# Patient Record
Sex: Female | Born: 1985
Health system: Southern US, Community
[De-identification: ages and names within clinical notes are randomized; demographics above are authoritative.]

## PROBLEM LIST (undated history)

## (undated) ENCOUNTER — Inpatient Hospital Stay (HOSPITAL_COMMUNITY): Payer: Self-pay

## (undated) ENCOUNTER — Emergency Department (HOSPITAL_COMMUNITY): Admission: EM | Disposition: A | Discharge status: Other Acute Inpatient Hospital | Payer: Medicaid Other

## (undated) DIAGNOSIS — F32A Depression, unspecified: Secondary | ICD-10-CM

## (undated) DIAGNOSIS — F410 Panic disorder [episodic paroxysmal anxiety] without agoraphobia: Secondary | ICD-10-CM

## (undated) DIAGNOSIS — R569 Unspecified convulsions: Secondary | ICD-10-CM

## (undated) DIAGNOSIS — Z9289 Personal history of other medical treatment: Secondary | ICD-10-CM

## (undated) DIAGNOSIS — E1165 Type 2 diabetes mellitus with hyperglycemia: Secondary | ICD-10-CM

## (undated) DIAGNOSIS — G43909 Migraine, unspecified, not intractable, without status migrainosus: Secondary | ICD-10-CM

## (undated) DIAGNOSIS — F329 Major depressive disorder, single episode, unspecified: Secondary | ICD-10-CM

## (undated) DIAGNOSIS — M419 Scoliosis, unspecified: Secondary | ICD-10-CM

## (undated) DIAGNOSIS — O09299 Supervision of pregnancy with other poor reproductive or obstetric history, unspecified trimester: Secondary | ICD-10-CM

## (undated) HISTORY — PX: SKIN GRAFT: SHX250

## (undated) HISTORY — PX: FOOT SURGERY: SHX648

## (undated) HISTORY — PX: DILATION AND CURETTAGE OF UTERUS: SHX78

## (undated) HISTORY — DX: Personal history of other medical treatment: Z92.89

## (undated) HISTORY — DX: Supervision of pregnancy with other poor reproductive or obstetric history, unspecified trimester: O09.299

## (undated) HISTORY — DX: Unspecified convulsions: R56.9

---

## 2006-07-11 HISTORY — PX: WISDOM TOOTH EXTRACTION: SHX21

## 2010-02-11 ENCOUNTER — Emergency Department (HOSPITAL_COMMUNITY): Admission: EM | Admit: 2010-02-11 | Discharge: 2010-02-11 | Payer: Self-pay | Admitting: Emergency Medicine

## 2010-09-09 ENCOUNTER — Emergency Department (HOSPITAL_COMMUNITY)
Admission: EM | Admit: 2010-09-09 | Discharge: 2010-09-09 | Discharge status: Against Medical Advice | Payer: Medicaid Other | Attending: Emergency Medicine | Admitting: Emergency Medicine

## 2010-09-09 DIAGNOSIS — H9209 Otalgia, unspecified ear: Secondary | ICD-10-CM | POA: Insufficient documentation

## 2010-09-20 ENCOUNTER — Emergency Department (HOSPITAL_COMMUNITY): Payer: Medicaid Other

## 2010-09-20 ENCOUNTER — Emergency Department (HOSPITAL_COMMUNITY)
Admission: EM | Admit: 2010-09-20 | Discharge: 2010-09-20 | Disposition: A | Discharge status: Home / Self Care | Payer: Medicaid Other | Attending: Emergency Medicine | Admitting: Emergency Medicine

## 2010-09-20 DIAGNOSIS — R209 Unspecified disturbances of skin sensation: Secondary | ICD-10-CM | POA: Insufficient documentation

## 2010-09-20 DIAGNOSIS — R110 Nausea: Secondary | ICD-10-CM | POA: Insufficient documentation

## 2010-09-20 DIAGNOSIS — R071 Chest pain on breathing: Secondary | ICD-10-CM | POA: Insufficient documentation

## 2010-09-20 LAB — PREGNANCY, URINE: Preg Test, Ur: NEGATIVE

## 2010-09-20 LAB — POCT I-STAT, CHEM 8
BUN: 5 mg/dL — ABNORMAL LOW (ref 6–23)
Chloride: 105 mEq/L (ref 96–112)
Creatinine, Ser: 1.1 mg/dL (ref 0.4–1.2)
Potassium: 4.2 mEq/L (ref 3.5–5.1)
Sodium: 142 mEq/L (ref 135–145)
TCO2: 25 mmol/L (ref 0–100)

## 2010-09-20 LAB — POCT CARDIAC MARKERS
Myoglobin, poc: 41.9 ng/mL (ref 12–200)
Troponin i, poc: <0.05 ng/mL (ref 0.00–0.09)

## 2010-09-20 LAB — URINALYSIS, ROUTINE W REFLEX MICROSCOPIC
Bilirubin Urine: NEGATIVE
Ketones, ur: NEGATIVE mg/dL
Nitrite: NEGATIVE
Specific Gravity, Urine: 1.012 (ref 1.005–1.030)
Urobilinogen, UA: 1 mg/dL (ref 0.0–1.0)

## 2010-09-20 LAB — CBC
HCT: 42.2 % (ref 36.0–46.0)
Hemoglobin: 14 g/dL (ref 12.0–15.0)
MCH: 30.2 pg (ref 26.0–34.0)
RBC: 4.64 MIL/uL (ref 3.87–5.11)

## 2010-09-20 LAB — DIFFERENTIAL
Basophils Relative: 0 % (ref 0–1)
Lymphocytes Relative: 38 % (ref 12–46)
Monocytes Absolute: 0.4 10*3/uL (ref 0.1–1.0)
Monocytes Relative: 8 % (ref 3–12)
Neutro Abs: 2.3 10*3/uL (ref 1.7–7.7)
Neutrophils Relative %: 50 % (ref 43–77)

## 2011-01-31 ENCOUNTER — Emergency Department (HOSPITAL_COMMUNITY)
Admission: EM | Admit: 2011-01-31 | Discharge: 2011-01-31 | Discharge status: Against Medical Advice | Payer: Self-pay | Attending: Emergency Medicine | Admitting: Emergency Medicine

## 2011-01-31 DIAGNOSIS — R209 Unspecified disturbances of skin sensation: Secondary | ICD-10-CM | POA: Insufficient documentation

## 2011-08-30 ENCOUNTER — Encounter (HOSPITAL_COMMUNITY): Payer: Self-pay | Admitting: *Deleted

## 2011-08-30 ENCOUNTER — Inpatient Hospital Stay (HOSPITAL_COMMUNITY): Payer: Medicaid Other

## 2011-08-30 ENCOUNTER — Inpatient Hospital Stay (HOSPITAL_COMMUNITY)
Admission: AD | Admit: 2011-08-30 | Discharge: 2011-08-30 | Disposition: A | Discharge status: Home / Self Care | Payer: Medicaid Other | Source: Ambulatory Visit | Attending: Obstetrics & Gynecology | Admitting: Obstetrics & Gynecology

## 2011-08-30 DIAGNOSIS — B9689 Other specified bacterial agents as the cause of diseases classified elsewhere: Secondary | ICD-10-CM | POA: Insufficient documentation

## 2011-08-30 DIAGNOSIS — A499 Bacterial infection, unspecified: Secondary | ICD-10-CM

## 2011-08-30 DIAGNOSIS — N949 Unspecified condition associated with female genital organs and menstrual cycle: Secondary | ICD-10-CM | POA: Insufficient documentation

## 2011-08-30 DIAGNOSIS — O99891 Other specified diseases and conditions complicating pregnancy: Secondary | ICD-10-CM | POA: Insufficient documentation

## 2011-08-30 DIAGNOSIS — O2390 Unspecified genitourinary tract infection in pregnancy, unspecified trimester: Secondary | ICD-10-CM | POA: Insufficient documentation

## 2011-08-30 DIAGNOSIS — N760 Acute vaginitis: Secondary | ICD-10-CM

## 2011-08-30 DIAGNOSIS — R109 Unspecified abdominal pain: Secondary | ICD-10-CM | POA: Insufficient documentation

## 2011-08-30 HISTORY — DX: Major depressive disorder, single episode, unspecified: F32.9

## 2011-08-30 HISTORY — DX: Panic disorder (episodic paroxysmal anxiety): F41.0

## 2011-08-30 HISTORY — DX: Depression, unspecified: F32.A

## 2011-08-30 LAB — URINALYSIS, ROUTINE W REFLEX MICROSCOPIC
Bilirubin Urine: NEGATIVE
Glucose, UA: NEGATIVE mg/dL
Hgb urine dipstick: NEGATIVE
Specific Gravity, Urine: 1.02 (ref 1.005–1.030)

## 2011-08-30 LAB — DIFFERENTIAL
Basophils Relative: 0 % (ref 0–1)
Eosinophils Absolute: 0.1 10*3/uL (ref 0.0–0.7)
Eosinophils Relative: 1 % (ref 0–5)
Lymphs Abs: 2.5 10*3/uL (ref 0.7–4.0)

## 2011-08-30 LAB — CBC
MCH: 30.9 pg (ref 26.0–34.0)
MCHC: 32.8 g/dL (ref 30.0–36.0)
MCV: 94.2 fL (ref 78.0–100.0)
Platelets: 231 10*3/uL (ref 150–400)
RDW: 13.1 % (ref 11.5–15.5)

## 2011-08-30 LAB — WET PREP, GENITAL
Trich, Wet Prep: NONE SEEN
Yeast Wet Prep HPF POC: NONE SEEN

## 2011-08-30 LAB — HCG, QUANTITATIVE, PREGNANCY: hCG, Beta Chain, Quant, S: 929 m[IU]/mL — ABNORMAL HIGH (ref <5)

## 2011-08-30 LAB — POCT PREGNANCY, URINE: Preg Test, Ur: POSITIVE — AB

## 2011-08-30 MED ORDER — METRONIDAZOLE 500 MG PO TABS: 500.0000 mg | ORAL_TABLET | Freq: Two times a day (BID) | ORAL | Status: AC

## 2011-08-30 NOTE — ED Provider Notes (Signed)
History     Chief Complaint  Patient presents with  . Abdominal Pain  . Possible Pregnancy   HPI 26 y.o. [id number] c/o low abd cramping, no bleeding. + UPT, took Plan B in February, wants "to make sure everything is ok".   Past Medical History  Diagnosis Date  . Asthma   . Panic attacks   . Depression     No meds. currently  . Constipation     Past Surgical History  Procedure Date  . Foot surgery     R foot, repair torn tissue  . Dilation and curettage of uterus     Suction D&C to remove 16 wk. fetal demise  . Wisdom tooth extraction     Family History  Problem Relation Age of Onset  . Anesthesia problems Neg Hx     History  Substance Use Topics  . Smoking status: Never Smoker   . Smokeless tobacco: Never Used  . Alcohol Use: Yes     Every other weekend    Allergies: No Known Allergies  Prescriptions prior to admission  Medication Sig Dispense Refill  . acetaminophen (TYLENOL) 500 MG tablet Take 500 mg by mouth every 6 (six) hours as needed. Head ache      . Pseudoeph-Doxylamine-DM-APAP (NYQUIL D COLD/FLU) 60-12.12-07-998 MG/30ML LIQD Take by mouth.        Review of Systems  Constitutional: Negative.   Respiratory: Negative.   Cardiovascular: Negative.   Gastrointestinal: Positive for abdominal pain. Negative for nausea, vomiting, diarrhea and constipation.  Genitourinary: Negative for dysuria, urgency, frequency, hematuria and flank pain.       Negative for vaginal bleeding, vaginal discharge  Musculoskeletal: Negative.   Neurological: Negative.   Psychiatric/Behavioral: Negative.    Physical Exam   Blood pressure 112/67, pulse 81, temperature 97.8 F (36.6 C), temperature source Oral, resp. rate 16, height 5\' 7"  (1.702 m), weight 117 lb 9.6 oz (53.343 kg), last menstrual period 08/01/2011, SpO2 100.00%.  Physical Exam  Nursing note and vitals reviewed. Constitutional: She is oriented to person, place, and time. She appears well-developed and  well-nourished. No distress.  HENT:  Head: Normocephalic and atraumatic.  Cardiovascular: Normal rate, regular rhythm and normal heart sounds.   Respiratory: Effort normal and breath sounds normal. No respiratory distress.  GI: Soft. Bowel sounds are normal. She exhibits no distension and no mass. There is no tenderness. There is no rebound and no guarding.  Genitourinary: There is no rash or lesion on the right labia. There is no rash or lesion on the left labia. Uterus is not deviated, not enlarged, not fixed and not tender. Cervix exhibits no motion tenderness, no discharge and no friability. Right adnexum displays no mass, no tenderness and no fullness. Left adnexum displays no mass, no tenderness and no fullness. No erythema, tenderness or bleeding around the vagina. No vaginal discharge found.  Neurological: She is alert and oriented to person, place, and time.  Skin: Skin is warm and dry.  Psychiatric: She has a normal mood and affect.    MAU Course  Procedures  Results for orders placed during the hospital encounter of 08/30/11 (from the past 72 hour(s))  URINALYSIS, ROUTINE W REFLEX MICROSCOPIC     Status: Abnormal   Collection Time   08/30/11  5:04 PM      Component Value Range Comment   Color, Urine YELLOW  YELLOW     APPearance HAZY (*) CLEAR     Specific Gravity, Urine 1.020  1.005 -  1.030     pH 8.0  5.0 - 8.0     Glucose, UA NEGATIVE  NEGATIVE (mg/dL)    Hgb urine dipstick NEGATIVE  NEGATIVE     Bilirubin Urine NEGATIVE  NEGATIVE     Ketones, ur NEGATIVE  NEGATIVE (mg/dL)    Protein, ur NEGATIVE  NEGATIVE (mg/dL)    Urobilinogen, UA 0.2  0.0 - 1.0 (mg/dL)    Nitrite NEGATIVE  NEGATIVE     Leukocytes, UA NEGATIVE  NEGATIVE  MICROSCOPIC NOT DONE ON URINES WITH NEGATIVE PROTEIN, BLOOD, LEUKOCYTES, NITRITE, OR GLUCOSE <1000 mg/dL.  POCT PREGNANCY, URINE     Status: Abnormal   Collection Time   08/30/11  5:27 PM      Component Value Range Comment   Preg Test, Ur POSITIVE  (*) NEGATIVE    CBC     Status: Normal   Collection Time   08/30/11  7:00 PM      Component Value Range Comment   WBC 7.4  4.0 - 10.5 (K/uL)    RBC 4.30  3.87 - 5.11 (MIL/uL)    Hemoglobin 13.3  12.0 - 15.0 (g/dL)    HCT 16.1  09.6 - 04.5 (%)    MCV 94.2  78.0 - 100.0 (fL)    MCH 30.9  26.0 - 34.0 (pg)    MCHC 32.8  30.0 - 36.0 (g/dL)    RDW 40.9  81.1 - 91.4 (%)    Platelets 231  150 - 400 (K/uL)   DIFFERENTIAL     Status: Normal   Collection Time   08/30/11  7:00 PM      Component Value Range Comment   Neutrophils Relative 59  43 - 77 (%)    Neutro Abs 4.4  1.7 - 7.7 (K/uL)    Lymphocytes Relative 33  12 - 46 (%)    Lymphs Abs 2.5  0.7 - 4.0 (K/uL)    Monocytes Relative 7  3 - 12 (%)    Monocytes Absolute 0.5  0.1 - 1.0 (K/uL)    Eosinophils Relative 1  0 - 5 (%)    Eosinophils Absolute 0.1  0.0 - 0.7 (K/uL)    Basophils Relative 0  0 - 1 (%)    Basophils Absolute 0.0  0.0 - 0.1 (K/uL)   HCG, QUANTITATIVE, PREGNANCY     Status: Abnormal   Collection Time   08/30/11  7:00 PM      Component Value Range Comment   hCG, Beta Chain, Quant, S 929 (*) <5 (mIU/mL)   ABO/RH     Status: Normal   Collection Time   08/30/11  7:00 PM      Component Value Range Comment   ABO/RH(D) AB POS     GC/CHLAMYDIA PROBE AMP, GENITAL     Status: Abnormal   Collection Time   08/30/11  8:32 PM      Component Value Range Comment   GC Probe Amp, Genital NEGATIVE  NEGATIVE     Chlamydia, DNA Probe POSITIVE (*) NEGATIVE    WET PREP, GENITAL     Status: Abnormal   Collection Time   08/30/11  8:32 PM      Component Value Range Comment   Yeast Wet Prep HPF POC NONE SEEN  NONE SEEN     Trich, Wet Prep NONE SEEN  NONE SEEN     Clue Cells Wet Prep HPF POC MODERATE (*) NONE SEEN     WBC, Wet Prep HPF POC RARE (*)  NONE SEEN  MODERATE BACTERIA SEEN   US Ob Comp Less 14 Wks  08/30/2011  *RADIOLOGY REPORT*  Clinical Data: Pelvic cramping.  4 weeks of 1 day pregnant by last menstrual period.  Quantitative beta  HCG 929.  OBSTETRIC <14 WK [place] AND TRANSVAGINAL OB US  Technique:  Both transabdominal and transvaginal ultrasound examinations were performed for complete evaluation of the gestation as well as the maternal uterus, adnexal regions, and pelvic cul-de-sac.  Transvaginal technique was performed to assess early pregnancy.  Comparison:  None.  Findings:  Small, elliptical collection of fluid within the endometrium in the uterine fundus.  This does not have the typical appearance of a gestational sac.  Normal appearing ovaries.  No adnexal masses or fluid collections.  No free peritoneal fluid.  IMPRESSION: Small amount of fluid in the endometrial cavity in the uterine fundus.  No gestational sac or ectopic pregnancy visible at this time.  Follow-up beta HCG and ultrasound, as clinically indicated, are recommended.  Original Report Authenticated By: Darrol Angel, M.D.   US Ob Transvaginal  08/30/2011  *RADIOLOGY REPORT*  Clinical Data: Pelvic cramping.  4 weeks of 1 day pregnant by last menstrual period.  Quantitative beta HCG 929.  OBSTETRIC <14 WK [place] AND TRANSVAGINAL OB US  Technique:  Both transabdominal and transvaginal ultrasound examinations were performed for complete evaluation of the gestation as well as the maternal uterus, adnexal regions, and pelvic cul-de-sac.  Transvaginal technique was performed to assess early pregnancy.  Comparison:  None.  Findings:  Small, elliptical collection of fluid within the endometrium in the uterine fundus.  This does not have the typical appearance of a gestational sac.  Normal appearing ovaries.  No adnexal masses or fluid collections.  No free peritoneal fluid.  IMPRESSION: Small amount of fluid in the endometrial cavity in the uterine fundus.  No gestational sac or ectopic pregnancy visible at this time.  Follow-up beta HCG and ultrasound, as clinically indicated, are recommended.  Original Report Authenticated By: Darrol Angel, M.D.    Assessment and Plan  26  y.o. [id number] at Unknown EGA with cramping BV - rx flagyl F/U for repeat quant in 48 hours  Alister Staver 08/30/2011, 8:39 PM

## 2011-08-30 NOTE — Progress Notes (Signed)
Patient states she took Plan B in early February. Had been having cramping and took a home pregnancy test that was positive. Has been having lower abdominal cramping.

## 2011-08-31 LAB — GC/CHLAMYDIA PROBE AMP, GENITAL
Chlamydia, DNA Probe: POSITIVE — AB
GC Probe Amp, Genital: NEGATIVE

## 2011-09-02 ENCOUNTER — Inpatient Hospital Stay (HOSPITAL_COMMUNITY)
Admission: AD | Admit: 2011-09-02 | Discharge: 2011-09-02 | Disposition: A | Discharge status: Home / Self Care | Payer: Medicaid Other | Source: Ambulatory Visit | Attending: Obstetrics & Gynecology | Admitting: Obstetrics & Gynecology

## 2011-09-02 ENCOUNTER — Encounter (HOSPITAL_COMMUNITY): Payer: Self-pay

## 2011-09-02 ENCOUNTER — Encounter (HOSPITAL_COMMUNITY): Payer: Self-pay | Admitting: Advanced Practice Midwife

## 2011-09-02 DIAGNOSIS — O209 Hemorrhage in early pregnancy, unspecified: Secondary | ICD-10-CM | POA: Insufficient documentation

## 2011-09-02 DIAGNOSIS — Z09 Encounter for follow-up examination after completed treatment for conditions other than malignant neoplasm: Secondary | ICD-10-CM

## 2011-09-02 NOTE — Discharge Instructions (Signed)
YOUR PREGNANCY HORMONE LEVEL HAS INCREASED FROM 929 TO 2985. THIS IS A NORMAL RISE. WE WILL HAVE YOU RETURN IN ONE WEEK TO REPEAT THE ULTRASOUND TO BE SURE THE PREGNANCY IS VISIBLE. RETURN SOONER FOR ANY PROBLEMS.

## 2011-09-02 NOTE — ED Provider Notes (Signed)
Nicole Gallegos is a 26 y.o. female @ [redacted]w[redacted]d gestation who presents to MAU for follow up Bhcg. On her visit 2/19 the bhcg was 929 and ultrasound showed no IUP or adnexal mass. Today the number has increased to 2985. We will schedule her follow up ultrasound for one week. She will return sooner for any problems.  [place], [place] 09/02/11 1332

## 2011-09-02 NOTE — Progress Notes (Signed)
Patient to MAU for repeat BHCG. Patient denies any pain or bleeding. Does have some nausea but no vomiting.

## 2011-09-09 ENCOUNTER — Inpatient Hospital Stay (HOSPITAL_COMMUNITY)
Admission: AD | Admit: 2011-09-09 | Discharge: 2011-09-09 | Disposition: A | Discharge status: Home / Self Care | Payer: Medicaid Other | Source: Ambulatory Visit | Attending: Family Medicine | Admitting: Family Medicine

## 2011-09-09 ENCOUNTER — Inpatient Hospital Stay (HOSPITAL_COMMUNITY): Payer: Medicaid Other

## 2011-09-09 DIAGNOSIS — O26899 Other specified pregnancy related conditions, unspecified trimester: Secondary | ICD-10-CM

## 2011-09-09 DIAGNOSIS — R109 Unspecified abdominal pain: Secondary | ICD-10-CM

## 2011-09-09 DIAGNOSIS — O210 Mild hyperemesis gravidarum: Secondary | ICD-10-CM | POA: Insufficient documentation

## 2011-09-09 MED ORDER — PROMETHAZINE HCL 25 MG PO TABS: 25.0000 mg | ORAL_TABLET | Freq: Four times a day (QID) | ORAL | Status: DC | PRN

## 2011-09-09 MED ORDER — ONDANSETRON 8 MG PO TBDP: 8.0000 mg | ORAL_TABLET | Freq: Three times a day (TID) | ORAL | Status: AC | PRN

## 2011-09-09 MED ORDER — ONDANSETRON 8 MG PO TBDP: 8.0000 mg | ORAL_TABLET | Freq: Three times a day (TID) | ORAL | Status: DC | PRN

## 2011-09-09 NOTE — Discharge Instructions (Signed)
Abdominal Pain During Pregnancy Abdominal discomfort is common in pregnancy. Most of the time, it does not cause harm. There are many causes of abdominal pain. Some causes are more serious than others. Some of the causes of abdominal pain in pregnancy are easily diagnosed. Occasionally, the diagnosis takes time to understand. Other times, the cause is not determined. Abdominal pain can be a sign that something is very wrong with the pregnancy, or the pain may have nothing to do with the pregnancy at all. For this reason, always tell your caregiver if you have any abdominal discomfort. CAUSES Common and harmless causes of abdominal pain include:  Constipation.   Excess gas and bloating.   Round ligament pain. This is pain that is felt in the folds of the groin.   The position the baby or placenta is in.   Baby kicks.   Braxton-Hicks contractions. These are mild contractions that do not cause cervical dilation.  Serious causes of abdominal pain include:  Ectopic pregnancy. This happens when a fertilized egg implants outside of the uterus.   Miscarriage.   Preterm labor. This is when labor starts at less than 37 weeks of pregnancy.   Placental abruption. This is when the placenta partially or completely separates from the uterus.   Preeclampsia. This is often associated with high blood pressure and has been referred to as "toxemia in pregnancy."   Uterine or amniotic fluid infections.  Causes unrelated to pregnancy include:  Urinary tract infection.   Gallbladder stones or inflammation.   Hepatitis or other liver illness.   Intestinal problems, stomach flu, food poisoning, or ulcer.   Appendicitis.   Kidney (renal) stones.   Kidney infection (pylonephritis).  HOME CARE INSTRUCTIONS  For mild pain:  Do not have sexual intercourse or put anything in your vagina until your symptoms go away completely.   Get plenty of rest until your pain improves. If your pain does not  improve in 1 hour, call your caregiver.   Drink clear fluids if you feel nauseous. Avoid solid food as long as you are uncomfortable or nauseous.   Only take medicine as directed by your caregiver.   Keep all follow-up appointments with your caregiver.  SEEK IMMEDIATE MEDICAL CARE IF:  You are bleeding, leaking fluid, or passing tissue from the vagina.   You have increasing pain or cramping.   You have persistent vomiting.   You have painful or bloody urination.   You have a fever.   You notice a decrease in your baby's movements.   You have extreme weakness or feel faint.   You have shortness of breath, with or without abdominal pain.   You develop a severe headache with abdominal pain.   You have abnormal vaginal discharge with abdominal pain.   You have persistent diarrhea.   You have abdominal pain that continues even after rest, or gets worse.  MAKE SURE YOU:   Understand these instructions.   Will watch your condition.   Will get help right away if you are not doing well or get worse.  Document Released: 06/27/2005 Document Revised: 03/09/2011 Document Reviewed: 01/21/2011 [employer] Patient Information 2012 [employer]. 

## 2011-09-09 NOTE — ED Provider Notes (Signed)
Chart reviewed and agree with management and plan.  

## 2011-09-09 NOTE — Progress Notes (Signed)
Here for follow up US, ordered by Np

## 2011-09-09 NOTE — ED Provider Notes (Signed)
History     No chief complaint on file.  HPI  Pt is [redacted]w[redacted]d pregnant for follow up ultrasound to confirm viability after  Being seen on 2/19 with cramping and  HCG 929- ultrasound showed no IUGS.  On 2/22 pt had f/u HCG 2985.  Today she is being seen for u/s  Past Medical History  Diagnosis Date  . Asthma   . Panic attacks   . Depression     No meds. currently  . Constipation     Past Surgical History  Procedure Date  . Foot surgery     R foot, repair torn tissue  . Dilation and curettage of uterus     Suction D&C to remove 16 wk. fetal demise  . Wisdom tooth extraction     Family History  Problem Relation Age of Onset  . Anesthesia problems Neg Hx     History  Substance Use Topics  . Smoking status: Never Smoker   . Smokeless tobacco: Never Used  . Alcohol Use: Yes     Every other weekend    Allergies: No Known Allergies  Prescriptions prior to admission  Medication Sig Dispense Refill  . acetaminophen (TYLENOL) 500 MG tablet Take 500 mg by mouth every 6 (six) hours as needed. Head ache      . metroNIDAZOLE (FLAGYL) 500 MG tablet Take 1 tablet (500 mg total) by mouth 2 (two) times daily.  14 tablet  0  . Pseudoeph-Doxylamine-DM-APAP (NYQUIL D COLD/FLU) 60-12.12-07-998 MG/30ML LIQD Take by mouth.        ROS Physical Exam   Last menstrual period 08/01/2011.  Physical Exam  MAU Course  Procedures  Pt was notified of positive Chlamydia and treated at [facility] on 2/21 and partner was treated Pt also notes nausea no vomiting- will give rx for phenergan and zofran Assessment and Plan  Single IUP [redacted]w[redacted]d pregnant with EDD 05/07/12 F/u for OB care Prescriptions for Phenergan and zofran  Nicole Gallegos 09/09/2011, 11:35 AM

## 2011-10-16 ENCOUNTER — Emergency Department (HOSPITAL_COMMUNITY)
Admission: EM | Admit: 2011-10-16 | Discharge: 2011-10-16 | Disposition: A | Discharge status: Home / Self Care | Payer: Medicaid Other | Attending: Emergency Medicine | Admitting: Emergency Medicine

## 2011-10-16 ENCOUNTER — Emergency Department (HOSPITAL_COMMUNITY): Payer: Medicaid Other

## 2011-10-16 ENCOUNTER — Encounter (HOSPITAL_COMMUNITY): Payer: Self-pay

## 2011-10-16 DIAGNOSIS — F329 Major depressive disorder, single episode, unspecified: Secondary | ICD-10-CM | POA: Insufficient documentation

## 2011-10-16 DIAGNOSIS — J45909 Unspecified asthma, uncomplicated: Secondary | ICD-10-CM | POA: Insufficient documentation

## 2011-10-16 DIAGNOSIS — O209 Hemorrhage in early pregnancy, unspecified: Secondary | ICD-10-CM | POA: Insufficient documentation

## 2011-10-16 DIAGNOSIS — R102 Pelvic and perineal pain: Secondary | ICD-10-CM

## 2011-10-16 DIAGNOSIS — F3289 Other specified depressive episodes: Secondary | ICD-10-CM | POA: Insufficient documentation

## 2011-10-16 LAB — DIFFERENTIAL
Eosinophils Absolute: 0.1 10*3/uL (ref 0.0–0.7)
Eosinophils Relative: 1 % (ref 0–5)
Lymphs Abs: 2.3 10*3/uL (ref 0.7–4.0)
Monocytes Absolute: 0.5 10*3/uL (ref 0.1–1.0)

## 2011-10-16 LAB — WET PREP, GENITAL: Yeast Wet Prep HPF POC: NONE SEEN

## 2011-10-16 LAB — BASIC METABOLIC PANEL
BUN: 10 mg/dL (ref 6–23)
Calcium: 9.6 mg/dL (ref 8.4–10.5)
Creatinine, Ser: 0.84 mg/dL (ref 0.50–1.10)
GFR calc non Af Amer: >90 mL/min (ref >90)
Glucose, Bld: 86 mg/dL (ref 70–99)
Sodium: 141 mEq/L (ref 135–145)

## 2011-10-16 LAB — URINE MICROSCOPIC-ADD ON

## 2011-10-16 LAB — CBC
HCT: 41.3 % (ref 36.0–46.0)
MCH: 31.1 pg (ref 26.0–34.0)
MCV: 91.2 fL (ref 78.0–100.0)
Platelets: 209 10*3/uL (ref 150–400)
RBC: 4.53 MIL/uL (ref 3.87–5.11)
RDW: 12.6 % (ref 11.5–15.5)

## 2011-10-16 LAB — HCG, QUANTITATIVE, PREGNANCY: hCG, Beta Chain, Quant, S: 318 m[IU]/mL — ABNORMAL HIGH (ref <5)

## 2011-10-16 LAB — URINALYSIS, ROUTINE W REFLEX MICROSCOPIC
Bilirubin Urine: NEGATIVE
Ketones, ur: 15 mg/dL — AB
Nitrite: NEGATIVE
Urobilinogen, UA: 1 mg/dL (ref 0.0–1.0)

## 2011-10-16 NOTE — ED Notes (Signed)
Patient transported to Ultrasound 

## 2011-10-16 NOTE — Discharge Instructions (Signed)
Nicole Gallegos, you had physical examination, laboratory tests and pelvic ultrasound to check on you for abdominal pain and bleeding after you had termination of pregnancy in March 2013.  Your pregnancy test was positive but the ultrasound was negative.  Dr. Ignacia Palma spoke to [facility] about you.  They recommend that you come to [facility]U on Tuesday morning between 8 A.M. And 12 Noon to have repeat blood test and ultrasound.

## 2011-10-16 NOTE — ED Provider Notes (Signed)
History     CSN: [id number]  Arrival date & time 10/16/11  0749   None     No chief complaint on file.   (Consider location/radiation/quality/duration/timing/severity/associated sxs/prior treatment) HPI Comments: PT had termination of pregnancy on September 20, 2011.  She has had intermittent lower abdominal pain and bleeding since then.  She denies fever.  She therefore seeks evaluation.  Patient is a 26 y.o. female presenting with vaginal bleeding. The history is provided by the patient and medical records. No language interpreter was used.  Vaginal Bleeding This is a new problem. The current episode started more than 1 week ago. The problem has not changed since onset.Associated symptoms include abdominal pain. Associated symptoms comments: None . The symptoms are aggravated by nothing. The symptoms are relieved by nothing. She has tried nothing for the symptoms.    Past Medical History  Diagnosis Date  . Asthma   . Panic attacks   . Depression     No meds. currently  . Constipation     Past Surgical History  Procedure Date  . Foot surgery     R foot, repair torn tissue  . Dilation and curettage of uterus     Suction D&C to remove 16 wk. fetal demise  . Wisdom tooth extraction     Family History  Problem Relation Age of Onset  . Anesthesia problems Neg Hx     History  Substance Use Topics  . Smoking status: Never Smoker   . Smokeless tobacco: Never Used  . Alcohol Use: Yes     Every other weekend    OB History    Grav Para Term Preterm Abortions TAB SAB Ect Mult Living   5 2 2  0 1 0 1 0 0 2      Review of Systems  Constitutional: Negative.  Negative for fever and chills.  HENT: Negative.   Eyes: Negative.   Respiratory: Negative.   Cardiovascular: Negative.   Gastrointestinal: Positive for abdominal pain. Negative for nausea, vomiting and diarrhea.  Genitourinary: Positive for vaginal bleeding.  Musculoskeletal: Negative.   Skin: Negative.     Neurological: Negative.   Psychiatric/Behavioral: Negative.     Allergies  Review of patient's allergies indicates no known allergies.  Home Medications  No current outpatient prescriptions on file.  BP 115/79  Temp(Src) 97.7 F (36.5 C) (Oral)  Resp 18  SpO2 99%  LMP 08/01/2011  Breastfeeding? Unknown  Physical Exam  Constitutional: She is oriented to person, place, and time. She appears well-developed and well-nourished. No distress.  HENT:  Head: Normocephalic and atraumatic.  Right Ear: External ear normal.  Left Ear: External ear normal.  Mouth/Throat: Oropharynx is clear and moist.  Eyes: Conjunctivae and EOM are normal. Pupils are equal, round, and reactive to light. No scleral icterus.  Neck: Normal range of motion. Neck supple.  Cardiovascular: Normal rate, regular rhythm and normal heart sounds.   Pulmonary/Chest: Effort normal and breath sounds normal.  Abdominal: Soft. Bowel sounds are normal. There is no tenderness.       She localizes pain to the suprapubic region.  There is no mass or point tenderness.  Genitourinary:       Normal external genitalia.  Minimal serosanguinous discharge.  Bimanual exam shows uterus small, nontender.  Adnexae show bilateral tenderness, no mass.  Musculoskeletal: Normal range of motion. She exhibits no edema.  Lymphadenopathy:    She has no cervical adenopathy.  Neurological: She is alert and oriented to person, place, and  time.       No sensory or motor deficit.  Skin: Skin is warm and dry.  Psychiatric: She has a normal mood and affect. Her behavior is normal.    ED Course  Procedures (including critical care time)  Labs Reviewed  POCT PREGNANCY, URINE - Abnormal; Notable for the following:    Preg Test, Ur POSITIVE (*)    All other components within normal limits  URINALYSIS, ROUTINE W REFLEX MICROSCOPIC  CBC  DIFFERENTIAL  BASIC METABOLIC PANEL  PREGNANCY, URINE  WET PREP, GENITAL  GC/CHLAMYDIA PROBE AMP, GENITAL   HCG, QUANTITATIVE, PREGNANCY   8:41 AM Pt seen --> physical exam and pelvic exam performed.  Lab workup and pelvic ultrasound ordered.  Urine pregnancy is positive.  1:21 PM Results for orders placed during the hospital encounter of 10/16/11  URINALYSIS, ROUTINE W REFLEX MICROSCOPIC      Component Value Range   Color, Urine YELLOW  YELLOW    APPearance CLOUDY (*) CLEAR    Specific Gravity, Urine 1.029  1.005 - 1.030    pH 7.0  5.0 - 8.0    Glucose, UA NEGATIVE  NEGATIVE (mg/dL)   Hgb urine dipstick MODERATE (*) NEGATIVE    Bilirubin Urine NEGATIVE  NEGATIVE    Ketones, ur 15 (*) NEGATIVE (mg/dL)   Protein, ur NEGATIVE  NEGATIVE (mg/dL)   Urobilinogen, UA 1.0  0.0 - 1.0 (mg/dL)   Nitrite NEGATIVE  NEGATIVE    Leukocytes, UA TRACE (*) NEGATIVE   POCT PREGNANCY, URINE      Component Value Range   Preg Test, Ur POSITIVE (*) NEGATIVE   CBC      Component Value Range   WBC 5.3  4.0 - 10.5 (K/uL)   RBC 4.53  3.87 - 5.11 (MIL/uL)   Hemoglobin 14.1  12.0 - 15.0 (g/dL)   HCT 95.6  21.3 - 08.6 (%)   MCV 91.2  78.0 - 100.0 (fL)   MCH 31.1  26.0 - 34.0 (pg)   MCHC 34.1  30.0 - 36.0 (g/dL)   RDW 57.8  46.9 - 62.9 (%)   Platelets 209  150 - 400 (K/uL)  DIFFERENTIAL      Component Value Range   Neutrophils Relative 47  43 - 77 (%)   Neutro Abs 2.5  1.7 - 7.7 (K/uL)   Lymphocytes Relative 43  12 - 46 (%)   Lymphs Abs 2.3  0.7 - 4.0 (K/uL)   Monocytes Relative 9  3 - 12 (%)   Monocytes Absolute 0.5  0.1 - 1.0 (K/uL)   Eosinophils Relative 1  0 - 5 (%)   Eosinophils Absolute 0.1  0.0 - 0.7 (K/uL)   Basophils Relative 0  0 - 1 (%)   Basophils Absolute 0.0  0.0 - 0.1 (K/uL)  BASIC METABOLIC PANEL      Component Value Range   Sodium 141  135 - 145 (mEq/L)   Potassium 4.3  3.5 - 5.1 (mEq/L)   Chloride 107  96 - 112 (mEq/L)   CO2 26  19 - 32 (mEq/L)   Glucose, Bld 86  70 - 99 (mg/dL)   BUN 10  6 - 23 (mg/dL)   Creatinine, Ser 5.28  0.50 - 1.10 (mg/dL)   Calcium 9.6  8.4 - 41.3  (mg/dL)   GFR calc non Af Amer >90  >90 (mL/min)   GFR calc Af Amer >90  >90 (mL/min)  PREGNANCY, URINE      Component Value Range  Preg Test, Ur POSITIVE (*) NEGATIVE   WET PREP, GENITAL      Component Value Range   Yeast Wet Prep HPF POC NONE SEEN  NONE SEEN    Trich, Wet Prep NONE SEEN  NONE SEEN    Clue Cells Wet Prep HPF POC FEW (*) NONE SEEN    WBC, Wet Prep HPF POC RARE (*) NONE SEEN   HCG, QUANTITATIVE, PREGNANCY      Component Value Range   hCG, Beta Chain, Quant, S 318 (*) <5 (mIU/mL)  URINE MICROSCOPIC-ADD ON      Component Value Range   Squamous Epithelial / LPF MANY (*) RARE    WBC, UA 0-2  <3 (WBC/hpf)   RBC / HPF 0-2  <3 (RBC/hpf)   Bacteria, UA FEW (*) RARE    Urine-Other MUCOUS PRESENT     US Ob Comp Less 14 Wks  10/16/2011  *RADIOLOGY REPORT*  Clinical Data: Vaginal bleeding.  Positive pregnancy test.  OBSTETRIC <14 WK ULTRASOUND, TRANSVAGINAL OB US, AND FETAL NUCHAL TRANSLUCENCY [place]  Technique:  Transabdominal and transvaginal ultrasound was performed for evaluation of the gestation as well as the maternal uterus and adnexal regions. Fetal nuchal translucency measurement was also obtained.  Findings:  There is no intrauterine gestational sac, yolk sac or embryo.  Maternal uterus/adnexae:  Small area of hypoechogenicity within the uterine cavity measures 0.9 x 0.3 x 0.3 cm.   Pelvic varicosities are identified.  The left ovary appears normal.  There are two cysts within the right ovary.  A trace amount of free fluid is noted within the pelvis.  IMPRESSION:  1.  There are no specific features to suggest intrauterine pregnancy. In the setting of a positive pregnancy test these findings may indicate early pregnancy, failed pregnancy, or occult ectopic pregnancy.  Serial follow-up beta HCG and pelvic sonography is recommended.  2.  Small nonspecific area of relative hypo echogenicity is noted within the endometrial cavity. This does not have the typical appearance of a  gestational sac. 3.  Right ovarian cysts 4.  Trace free fluid within the pelvis which may be physiologic in a premenopausal female.  Original Report Authenticated By: Rosealee Albee, M.D.   US Ob Transvaginal  10/16/2011  *RADIOLOGY REPORT*  Clinical Data: Vaginal bleeding.  Positive pregnancy test.  OBSTETRIC <14 WK ULTRASOUND, TRANSVAGINAL OB US, AND FETAL NUCHAL TRANSLUCENCY [place]  Technique:  Transabdominal and transvaginal ultrasound was performed for evaluation of the gestation as well as the maternal uterus and adnexal regions. Fetal nuchal translucency measurement was also obtained.  Findings:  There is no intrauterine gestational sac, yolk sac or embryo.  Maternal uterus/adnexae:  Small area of hypoechogenicity within the uterine cavity measures 0.9 x 0.3 x 0.3 cm.   Pelvic varicosities are identified.  The left ovary appears normal.  There are two cysts within the right ovary.  A trace amount of free fluid is noted within the pelvis.  IMPRESSION:  1.  There are no specific features to suggest intrauterine pregnancy. In the setting of a positive pregnancy test these findings may indicate early pregnancy, failed pregnancy, or occult ectopic pregnancy.  Serial follow-up beta HCG and pelvic sonography is recommended.  2.  Small nonspecific area of relative hypo echogenicity is noted within the endometrial cavity. This does not have the typical appearance of a gestational sac. 3.  Right ovarian cysts 4.  Trace free fluid within the pelvis which may be physiologic in a premenopausal female.  Original Report Authenticated  By: Rosealee Albee, M.D.   [facility] for repeat quantitative HCG and ultrasound.  1. Pelvic pain           Carleene Cooper III, MD 10/16/11 1358

## 2011-10-16 NOTE — ED Notes (Signed)
Patient had an abortion March 12th, has had moderate to heavy vaginal bleeding since saturating 4 tampons.  Patient also reporting bilateral groin pain with radiation to right flank. Patient denies urinary symptoms.

## 2011-10-17 LAB — GC/CHLAMYDIA PROBE AMP, GENITAL
Chlamydia, DNA Probe: NEGATIVE
GC Probe Amp, Genital: NEGATIVE

## 2011-10-18 ENCOUNTER — Inpatient Hospital Stay (HOSPITAL_COMMUNITY)
Admission: AD | Admit: 2011-10-18 | Discharge: 2011-10-18 | Disposition: A | Discharge status: Home / Self Care | Payer: Medicaid Other | Source: Ambulatory Visit | Attending: Obstetrics & Gynecology | Admitting: Obstetrics & Gynecology

## 2011-10-18 ENCOUNTER — Inpatient Hospital Stay (HOSPITAL_COMMUNITY): Payer: Medicaid Other

## 2011-10-18 ENCOUNTER — Encounter (HOSPITAL_COMMUNITY): Payer: Self-pay | Admitting: *Deleted

## 2011-10-18 DIAGNOSIS — Z332 Encounter for elective termination of pregnancy: Secondary | ICD-10-CM | POA: Insufficient documentation

## 2011-10-18 DIAGNOSIS — Z09 Encounter for follow-up examination after completed treatment for conditions other than malignant neoplasm: Secondary | ICD-10-CM | POA: Insufficient documentation

## 2011-10-18 NOTE — MAU Note (Signed)
Pt seen at [facility] on 4/7, had TAB on 3/12, went to [facility] for bleeding & pain, quant was elevated, pt had U/S, was told to come to MAU today.  Pt states her bleeding stopped after leaving the hospital on 4/7, denies pain.  Pt also states there is a possibility she could be pregnant again.

## 2011-10-18 NOTE — MAU Provider Note (Signed)
S: Pt in MAU today for repeat quant HCG and U/S.  She is not actively bleeding and denies abdominal pain, n/v, and fever/chills.  She is concerned that she has become pregnant again following her pregnancy termination on 09/20/11.    O: BP 107/65  Pulse 72  Temp(Src) 97.7 F (36.5 C) (Oral)  Resp 16  LMP 08/01/2011  Breastfeeding? No  She had an U/S and Quant HCG at [facility] 48 hours ago (quant 318 on 09/15/11).    U/S today indicates possible RPOC and cannot rule out early ectopic.  See imaging report.    Results for orders placed during the hospital encounter of 10/18/11 (from the past 72 hour(s))  HCG, QUANTITATIVE, PREGNANCY     Status: Abnormal   Collection Time   10/18/11  9:15 AM      Component Value Range Comment   hCG, Beta Chain, Quant, S 242 (*) <5 (mIU/mL)    A: RPOC following termination vs early ectopic  P: Discussed plan of care with Maylon Cos, CNM D/C home Repeat quant HCG in 48 hours.  If decreasing quant HCG, consider Cytotec. Discussed contraception with pt.  She desires Mirena and will contact HD for placement when current pregnancy resolved.  Encouraged use of condoms until Mirena placed.

## 2011-10-20 ENCOUNTER — Encounter (HOSPITAL_COMMUNITY): Payer: Self-pay

## 2011-10-20 ENCOUNTER — Inpatient Hospital Stay (HOSPITAL_COMMUNITY)
Admission: AD | Admit: 2011-10-20 | Discharge: 2011-10-20 | Disposition: A | Discharge status: Home / Self Care | Payer: Medicaid Other | Source: Ambulatory Visit | Attending: Obstetrics and Gynecology | Admitting: Obstetrics and Gynecology

## 2011-10-20 DIAGNOSIS — Z09 Encounter for follow-up examination after completed treatment for conditions other than malignant neoplasm: Secondary | ICD-10-CM | POA: Insufficient documentation

## 2011-10-20 DIAGNOSIS — Z332 Encounter for elective termination of pregnancy: Secondary | ICD-10-CM | POA: Insufficient documentation

## 2011-10-20 NOTE — MAU Provider Note (Signed)
  History     CSN: [id number]  Arrival date and time: 10/20/11 4098   None     Chief Complaint  Patient presents with  . Labs Only   HPI Nicole Gallegos is 26 y.o. [phone] presents for follow up BHCG   She had termination of  pregnancy on 3/12.  Came into [facility] on 4/7 with abdominal pain and vaginal bleeding.   U/S on that date did not see IUP, there was hyperechogenicity in the endometrium cavity that did not appear to be a normal gestational sac.   BHCG was 318.  She returned here 4/9 for followup-BHCG 242.  Today she reports mild pain, rating it 1/10 and no vaginal bleeding.      Past Medical History  Diagnosis Date  . Asthma   . Panic attacks   . Depression     No meds. currently  . Constipation     Past Surgical History  Procedure Date  . Foot surgery     R foot, repair torn tissue  . Dilation and curettage of uterus     Suction D&C to remove 16 wk. fetal demise  . Wisdom tooth extraction     Family History  Problem Relation Age of Onset  . Anesthesia problems Neg Hx     History  Substance Use Topics  . Smoking status: Never Smoker   . Smokeless tobacco: Never Used  . Alcohol Use: Yes     Every other weekend    Allergies: No Known Allergies  No prescriptions prior to admission    Review of Systems  Gastrointestinal: Positive for abdominal pain (rates 1/10 menstrual like cramps).  Genitourinary:       Negative for vaginal bleeding   Physical Exam   Blood pressure 114/75, pulse 65, temperature 97.5 F (36.4 C), resp. rate 16, height 5\' 8"  (1.727 m), weight 51.256 kg (113 lb), last menstrual period 08/01/2011.  Physical Exam  Constitutional: She is oriented to person, place, and time. She appears well-developed and well-nourished. No distress.  Genitourinary:       Not indicated  Neurological: She is alert and oriented to person, place, and time.  Skin: Skin is warm and dry.  Psychiatric: She has a normal mood and affect.   Results for orders  placed during the hospital encounter of 10/20/11 (from the past 24 hour(s))  HCG, QUANTITATIVE, PREGNANCY     Status: Abnormal   Collection Time   10/20/11 10:00 AM      Component Value Range   hCG, Beta Chain, Quant, S 180 (*) <5 (mIU/mL)   MAU Course  Procedures  MDM 11:00 discussed followup plan with Dr. Jolayne Panther.  Patient does have appt with [facility] for first of May.    Assessment and Plan  A:  Falling BHCG after EAB 09/20/11  P:  Keep appt with [facility]      Barrier contraception until seen by clinic for contraception counseling  Mariadelaluz Guggenheim,EVE M 10/20/2011, 9:53 AM

## 2011-10-20 NOTE — Discharge Instructions (Signed)
Miscarriage  An early pregnancy loss or spontaneous abortion (miscarriage) is a common problem. This usually happens when the pregnancy is not developing normally. It is very unlikely that you or your partner did anything to cause this, although cigarette smoking, a sexually transmitted disease, excessive alcohol use, or drug abuse can increase the risk. Other causes are:   Abnormalities of the uterus.   Hormone or medical problems.   Trauma or genetic (chromosome) problems.  Having a miscarriage does not change your chances of having a normal pregnancy in the future. Your caregiver will advise you when it is safe to try to get pregnant again.  AFTER A MISCARRIAGE   A miscarriage is inevitable when there is continual, heavy vaginal bleeding; cramping; dilation of the cervix; or passing of any pregnancy tissue. Bleeding and cramping will usually continue until all the tissue has been removed from the womb (uterus).   Often the uterus does not clean itself out completely. A medication or a D&C procedure is needed to loosen or remove the pregnancy tissue from the uterus. A D&C scrapes or suctions the tissue out.   If you are RH negative, you may need to have Rh immune globulin to avoid Rh problems.   You may be given medication to fight an infection if the miscarriage was due to an infection.  HOME CARE INSTRUCTIONS    You should rest in bed for the next 2 to 3 days.   Do not take tub baths or put anything in your vagina, including tampons or a douche.   Do not have sex until your caregiver approves.   Avoid exercise or heavy activities until directed by your caregiver.   Save any vaginal discharge that looks like tissue. Ask your caregiver if he or she wants to inspect the discharge.   If you and your partner are having problems with guilt or grieving, talk to your caregiver or get counseling to help you understand and cope with your pregnancy loss.   Allow enough time to grieve before trying to get  pregnant again.  SEEK IMMEDIATE MEDICAL CARE IF:    You have persistent heavy bleeding or a bad smelling vaginal discharge.   You have continued abdominal or pelvic pain.   You have an oral temperature above 102 F (38.9 C), not controlled by medicine.   You have severe weakness, fainting, or keep throwing up (vomiting).   You develop chills.   You are experiencing domestic violence.  MAKE SURE YOU:    Understand these instructions.   Will watch your condition.   Will get help right away if you are not doing well or get worse.  Document Released: 08/04/2004 Document Revised: 06/16/2011 Document Reviewed: 06/19/2008  Exit[employer] Patient Information 2012 [employer].

## 2011-10-20 NOTE — MAU Note (Signed)
Pt states here for repeat bhcg, has mild cramping, rates 1/10. Denies bleeding or vag d/c changes.

## 2011-10-24 NOTE — MAU Provider Note (Signed)
Agree with above note.  Nicole Gallegos 10/24/2011 1:36 PM   

## 2012-03-04 ENCOUNTER — Emergency Department (HOSPITAL_COMMUNITY)
Admission: EM | Admit: 2012-03-04 | Discharge: 2012-03-04 | Disposition: A | Discharge status: Home / Self Care | Payer: Medicaid Other | Attending: Emergency Medicine | Admitting: Emergency Medicine

## 2012-03-04 ENCOUNTER — Encounter (HOSPITAL_COMMUNITY): Payer: Self-pay | Admitting: *Deleted

## 2012-03-04 ENCOUNTER — Emergency Department (HOSPITAL_COMMUNITY): Payer: Medicaid Other

## 2012-03-04 DIAGNOSIS — J45909 Unspecified asthma, uncomplicated: Secondary | ICD-10-CM | POA: Insufficient documentation

## 2012-03-04 DIAGNOSIS — F3289 Other specified depressive episodes: Secondary | ICD-10-CM | POA: Insufficient documentation

## 2012-03-04 DIAGNOSIS — S60229A Contusion of unspecified hand, initial encounter: Secondary | ICD-10-CM

## 2012-03-04 DIAGNOSIS — X838XXA Intentional self-harm by other specified means, initial encounter: Secondary | ICD-10-CM | POA: Insufficient documentation

## 2012-03-04 DIAGNOSIS — F329 Major depressive disorder, single episode, unspecified: Secondary | ICD-10-CM | POA: Insufficient documentation

## 2012-03-04 MED ORDER — TRAMADOL HCL 50 MG PO TABS
50.0000 mg | ORAL_TABLET | Freq: Once | ORAL | Status: AC
  Administered 2012-03-04: 50 mg via ORAL
  Filled 2012-03-04: qty 1

## 2012-03-04 MED ORDER — TRAMADOL HCL 50 MG PO TABS: 50.0000 mg | ORAL_TABLET | Freq: Four times a day (QID) | ORAL | Status: AC | PRN
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
## 2012-03-04 NOTE — ED Notes (Signed)
Patient given discharge instructions, information, prescriptions, and diet order. Patient states that they adequately understand discharge information given and to return to ED if symptoms return or worsen.     
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
## 2012-03-08 NOTE — ED Provider Notes (Signed)
Medical screening examination/treatment/procedure(s) were performed by non-physician practitioner and as supervising physician I was immediately available for consultation/collaboration.  Donnetta Hutching, MD 03/08/12 [phone]

## 2012-03-27 ENCOUNTER — Encounter (HOSPITAL_COMMUNITY): Payer: Self-pay | Admitting: Family Medicine

## 2012-03-27 ENCOUNTER — Emergency Department (HOSPITAL_COMMUNITY)
Admission: EM | Admit: 2012-03-27 | Discharge: 2012-03-27 | Disposition: A | Discharge status: Home / Self Care | Payer: Medicaid Other | Attending: Emergency Medicine | Admitting: Emergency Medicine

## 2012-03-27 DIAGNOSIS — K219 Gastro-esophageal reflux disease without esophagitis: Secondary | ICD-10-CM

## 2012-03-27 LAB — CBC WITH DIFFERENTIAL/PLATELET
Basophils Relative: 1 % (ref 0–1)
Eosinophils Absolute: 0.1 10*3/uL (ref 0.0–0.7)
HCT: 45.2 % (ref 36.0–46.0)
Hemoglobin: 15.3 g/dL — ABNORMAL HIGH (ref 12.0–15.0)
MCH: 31.1 pg (ref 26.0–34.0)
MCHC: 33.8 g/dL (ref 30.0–36.0)
Monocytes Absolute: 0.4 10*3/uL (ref 0.1–1.0)
Monocytes Relative: 8 % (ref 3–12)

## 2012-03-27 LAB — URINALYSIS, MICROSCOPIC ONLY
Glucose, UA: NEGATIVE mg/dL
Leukocytes, UA: NEGATIVE
Nitrite: NEGATIVE
Protein, ur: NEGATIVE mg/dL
Urine-Other: NONE SEEN
Urobilinogen, UA: 1 mg/dL (ref 0.0–1.0)

## 2012-03-27 LAB — COMPREHENSIVE METABOLIC PANEL
Albumin: 4.3 g/dL (ref 3.5–5.2)
BUN: 7 mg/dL (ref 6–23)
Chloride: 99 mEq/L (ref 96–112)
Creatinine, Ser: 0.86 mg/dL (ref 0.50–1.10)
GFR calc Af Amer: >90 mL/min (ref >90)
Total Bilirubin: 0.8 mg/dL (ref 0.3–1.2)

## 2012-03-27 MED ORDER — FAMOTIDINE 20 MG PO TABS
20.0000 mg | ORAL_TABLET | Freq: Once | ORAL | Status: AC
  Administered 2012-03-27: 20 mg via ORAL
  Filled 2012-03-27: qty 1

## 2012-03-27 MED ORDER — FAMOTIDINE 20 MG PO TABS: 20.0000 mg | ORAL_TABLET | Freq: Two times a day (BID) | ORAL | Status: DC

## 2012-03-27 NOTE — ED Notes (Signed)
Pt reports having N/V x 3 weeks. States periods over the past couple months have been irregular. States she took a pregnancy test at home and it was negative, but states she feels like she is pregnant.

## 2012-03-27 NOTE — ED Provider Notes (Signed)
History     CSN: [id number]  Arrival date & time 03/27/12  1618   First MD Initiated Contact with Patient 03/27/12 2125      Chief Complaint  Patient presents with  . Nausea  . Emesis    (Consider location/radiation/quality/duration/timing/severity/associated sxs/prior treatment) HPI Comments: Patient states, that for the last 3, weeks.  She's had a most continuous nausea in the epigastric area.  It wakes her from sleep with a burning sensation up into the mid chest.  She also reports, that she's had irregular menstrual cycles for the last 2-3 months if he or she may be pregnant.  She has done home pregnancy test, which has been negative,she would like to verify this.  She has not taking any over-the-counter medication for her symptoms.  She has not lost weight, denies diarrhea, constipation, dysuria, frequency, flank pain, vaginal discharge  Patient is a 26 y.o. female presenting with vomiting. The history is provided by the patient.  Emesis  This is a new problem. The current episode started more than 1 week ago. The problem occurs 2 to 4 times per day. The problem has not changed since onset.There has been no fever. Pertinent negatives include no abdominal pain, no chills, no cough, no diarrhea, no fever, no headaches and no myalgias.    Past Medical History  Diagnosis Date  . Asthma   . Panic attacks   . Depression     No meds. currently  . Constipation     Past Surgical History  Procedure Date  . Foot surgery     R foot, repair torn tissue  . Dilation and curettage of uterus     Suction D&C to remove 16 wk. fetal demise  . Wisdom tooth extraction     Family History  Problem Relation Age of Onset  . Anesthesia problems Neg Hx     History  Substance Use Topics  . Smoking status: Never Smoker   . Smokeless tobacco: Never Used  . Alcohol Use: No     Every other weekend    OB History    Grav Para Term Preterm Abortions TAB SAB Ect Mult Living   5 2 2  0 2 1 1  0  0 2      Review of Systems  Constitutional: Negative for fever and chills.  Respiratory: Negative for cough and shortness of breath.   Cardiovascular: Negative for chest pain.  Gastrointestinal: Positive for nausea. Negative for vomiting, abdominal pain, diarrhea and constipation.  Genitourinary: Negative for dysuria, frequency, vaginal bleeding and vaginal discharge.  Musculoskeletal: Negative for myalgias.  Skin: Negative for rash.  Neurological: Negative for weakness and headaches.    Allergies  Chocolate  Home Medications   Current Outpatient Rx  Name Route Sig Dispense Refill  . FAMOTIDINE 20 MG PO TABS Oral Take 1 tablet (20 mg total) by mouth 2 (two) times daily. 40 tablet 0    Take 1 tablet twice a day for 7 days and then one  ...    BP 102/74  Pulse 68  Temp 97.8 F (36.6 C) (Oral)  Resp 16  SpO2 100%  LMP 02/07/2012  Physical Exam  Constitutional: She is oriented to person, place, and time. She appears well-nourished.  HENT:  Head: Normocephalic.  Eyes: Pupils are equal, round, and reactive to light.  Neck: Normal range of motion.  Cardiovascular: Normal rate.   Pulmonary/Chest: Effort normal.  Abdominal: Soft. She exhibits no distension. There is no tenderness.  Musculoskeletal: Normal range  of motion.  Neurological: She is alert and oriented to person, place, and time.  Skin: Skin is warm. No rash noted.    ED Course  Procedures (including critical care time)  Labs Reviewed  CBC WITH DIFFERENTIAL - Abnormal; Notable for the following:    Hemoglobin 15.3 (*)     All other components within normal limits  URINALYSIS, WITH MICROSCOPIC - Abnormal; Notable for the following:    APPearance CLOUDY (*)     All other components within normal limits  COMPREHENSIVE METABOLIC PANEL  POCT PREGNANCY, URINE   No results found.   1. GERD (gastroesophageal reflux disease)       MDM  Which is disposition for GERD symptoms reviewed.  Her labs, which were  normal.  She is not pregnant.  She does not have a UTI.  She does have an appointment with her primary care physician for next week for followup         Arman Filter, NP 03/27/12 2143

## 2012-03-29 NOTE — ED Provider Notes (Signed)
Medical screening examination/treatment/procedure(s) were performed by non-physician practitioner and as supervising physician I was immediately available for consultation/collaboration.   Soleia Badolato E Sina Sumpter, MD 03/29/12 0726 

## 2012-07-11 NOTE — L&D Delivery Note (Signed)
Delivery Note At 9:37 PM a viable female "Nicole Gallegos" was delivered via Vaginal, Spontaneous Delivery (Presentation: ROA).  APGAR: 8, 9; weight pending.   Placenta status: Intact, Spontaneous, shultz.  Cord: 3 vessels with the following complications: None.  Cord pH: not collected.    Anesthesia: None  Episiotomy: None Lacerations: Labial hemostatic no repair Suture Repair: n/a Est. Blood Loss (mL): 50  Mom to postpartum.  Baby to skin to skin immediately after delivery.  Routine PP orders Plans to bottle feed Placenta sent to pathology d/t precipitous delivery. Plans IUD for [facility]  Nicole Gallegos 04/19/2013, 9:59 PM

## 2012-09-07 ENCOUNTER — Encounter (HOSPITAL_COMMUNITY): Payer: Self-pay | Admitting: Emergency Medicine

## 2012-09-07 ENCOUNTER — Emergency Department (HOSPITAL_COMMUNITY)
Admission: EM | Admit: 2012-09-07 | Discharge: 2012-09-07 | Disposition: A | Discharge status: Home / Self Care | Payer: Medicaid Other | Source: Home / Self Care | Attending: Family Medicine | Admitting: Family Medicine

## 2012-09-07 DIAGNOSIS — R0789 Other chest pain: Secondary | ICD-10-CM

## 2012-09-07 DIAGNOSIS — R071 Chest pain on breathing: Secondary | ICD-10-CM

## 2012-09-07 MED ORDER — DICLOFENAC POTASSIUM 50 MG PO TABS: 50.0000 mg | ORAL_TABLET | Freq: Three times a day (TID) | ORAL | Status: DC

## 2012-09-07 NOTE — ED Provider Notes (Signed)
History     CSN: [id number]  Arrival date & time 09/07/12  1521   First MD Initiated Contact with Patient 09/07/12 1540      Chief Complaint  Patient presents with  . Chest Pain    tingling in left arm and hand on thursday.   . Migraine    pain in frontal lobe only    (Consider location/radiation/quality/duration/timing/severity/associated sxs/prior treatment) Patient is a 27 y.o. female presenting with chest pain. The history is provided by the patient and a relative.  Chest Pain Pain location:  L lateral chest Pain quality: sharp and shooting   Pain radiates to:  L shoulder Pain radiates to the back: yes   Pain severity:  Mild Onset quality:  Sudden Duration:  3 days Progression:  Unchanged Chronicity:  Recurrent Context comment:  Uses arms a lot at work Relieved by:  None tried Worsened by:  Nothing tried Ineffective treatments:  None tried Associated symptoms: headache   Associated symptoms: no abdominal pain, no back pain, no dizziness, no palpitations and no shortness of breath   Associated symptoms comment:  Under lot of stress from deaths in family. Risk factors: no coronary artery disease     Past Medical History  Diagnosis Date  . Asthma   . Panic attacks   . Depression     No meds. currently  . Constipation     Past Surgical History  Procedure Laterality Date  . Foot surgery      R foot, repair torn tissue  . Dilation and curettage of uterus      Suction D&C to remove 16 wk. fetal demise  . Wisdom tooth extraction      Family History  Problem Relation Age of Onset  . Anesthesia problems Neg Hx     History  Substance Use Topics  . Smoking status: Never Smoker   . Smokeless tobacco: Never Used  . Alcohol Use: No     Comment: Every other weekend    OB History   Grav Para Term Preterm Abortions TAB SAB Ect Mult Living   5 2 2  0 2 1 1  0 0 2      Review of Systems  Constitutional: Negative.   Respiratory: Negative for shortness of  breath.   Cardiovascular: Positive for chest pain. Negative for palpitations.  Gastrointestinal: Negative for abdominal pain.  Musculoskeletal: Negative for back pain.  Neurological: Positive for headaches. Negative for dizziness.    Allergies  Chocolate  Home Medications   Current Outpatient Rx  Name  Route  Sig  Dispense  Refill  . diclofenac (CATAFLAM) 50 MG tablet   Oral   Take 1 tablet (50 mg total) by mouth 3 (three) times daily.   30 tablet   0   . famotidine (PEPCID) 20 MG tablet   Oral   Take 1 tablet (20 mg total) by mouth 2 (two) times daily.   40 tablet   0     Take 1 tablet twice a day for 7 days and then one  ...     BP 101/74  Pulse 70  Temp(Src) 98 F (36.7 C) (Oral)  SpO2 100%  LMP 08/09/2012  Physical Exam  Nursing note and vitals reviewed. Constitutional: She is oriented to person, place, and time. She appears well-developed and well-nourished.  Neck: Normal range of motion. Neck supple.  Cardiovascular: Normal rate, regular rhythm, normal heart sounds and intact distal pulses.   Pulmonary/Chest: Effort normal and breath sounds normal. She  exhibits tenderness.    Lymphadenopathy:    She has no cervical adenopathy.  Neurological: She is alert and oriented to person, place, and time.  Skin: Skin is warm and dry.    ED Course  Procedures (including critical care time)  Labs Reviewed - No data to display No results found.   1. Left-sided chest wall pain       MDM          Linna Hoff, MD 09/07/12 [phone]

## 2012-09-07 NOTE — ED Notes (Signed)
Pt c/o chest pain. Pt states "feels like some one is squeezing heart" tingling in left arm and hand on Thursday.  Migraine. Pain is only present in the frontal lobe.  Pt states that last year she had the same symptoms and   Was told at the hospital that she had a mild stroke. Pt has not taking any meds for symptoms.

## 2012-09-08 ENCOUNTER — Inpatient Hospital Stay (HOSPITAL_COMMUNITY)
Admission: AD | Admit: 2012-09-08 | Discharge: 2012-09-08 | Disposition: A | Discharge status: Home / Self Care | Payer: Medicaid Other | Source: Ambulatory Visit | Attending: Obstetrics & Gynecology | Admitting: Obstetrics & Gynecology

## 2012-09-08 DIAGNOSIS — Z3201 Encounter for pregnancy test, result positive: Secondary | ICD-10-CM | POA: Insufficient documentation

## 2012-09-08 NOTE — MAU Provider Note (Signed)
Attestation of Attending Supervision of Advanced Practitioner (PA/CNM/NP): Evaluation and management procedures were performed by the Advanced Practitioner under my supervision and collaboration.  I have reviewed the Advanced Practitioner's note and chart, and I agree with the management and plan.  Jennafer Gladue, MD, FACOG Attending Obstetrician & Gynecologist Faculty Practice, [facility]  

## 2012-09-08 NOTE — MAU Provider Note (Signed)
History     CSN: [id number]  Arrival date and time: 09/08/12 1432   None     Chief Complaint  Patient presents with  . Possible Pregnancy   HPI 27 y.o. [id number] at 4.[redacted] weeks EGA with + HPT, here for verification, denies pain or bleeding.    Past Medical History  Diagnosis Date  . Asthma   . Panic attacks   . Depression     No meds. currently  . Constipation     Past Surgical History  Procedure Laterality Date  . Foot surgery      R foot, repair torn tissue  . Dilation and curettage of uterus      Suction D&C to remove 16 wk. fetal demise  . Wisdom tooth extraction      Family History  Problem Relation Age of Onset  . Anesthesia problems Neg Hx     History  Substance Use Topics  . Smoking status: Never Smoker   . Smokeless tobacco: Never Used  . Alcohol Use: No     Comment: Every other weekend    Allergies:  Allergies  Allergen Reactions  . Chocolate Anaphylaxis    Prescriptions prior to admission  Medication Sig Dispense Refill  . famotidine (PEPCID) 20 MG tablet Take 1 tablet (20 mg total) by mouth 2 (two) times daily.  40 tablet  0  . [DISCONTINUED] diclofenac (CATAFLAM) 50 MG tablet Take 1 tablet (50 mg total) by mouth 3 (three) times daily.  30 tablet  0    Review of Systems  Constitutional: Negative.   Respiratory: Negative.   Cardiovascular: Negative.   Gastrointestinal: Negative for nausea, vomiting, abdominal pain, diarrhea and constipation.  Genitourinary: Negative for dysuria, urgency, frequency, hematuria and flank pain.       Negative for vaginal bleeding, vaginal discharge, dyspareunia  Musculoskeletal: Negative.   Neurological: Negative.   Psychiatric/Behavioral: Negative.    Physical Exam   Pulse 78, temperature 98 F (36.7 C), temperature source Oral, resp. rate 18, height 5\' 8"  (1.727 m), weight 117 lb 6.4 oz (53.252 kg), last menstrual period 08/09/2012.  Physical Exam  Nursing note and vitals reviewed. Constitutional: She  is oriented to person, place, and time. She appears well-developed and well-nourished. No distress.  Cardiovascular: Normal rate.   Respiratory: Effort normal.  Musculoskeletal: Normal range of motion.  Neurological: She is alert and oriented to person, place, and time.  Skin: Skin is warm and dry.  Psychiatric: She has a normal mood and affect.    MAU Course  Procedures Results for orders placed during the hospital encounter of 09/08/12 (from the past 24 hour(s))  POCT PREGNANCY, URINE     Status: Abnormal   Collection Time    09/08/12  2:51 PM      Result Value Range   Preg Test, Ur POSITIVE (*) NEGATIVE     Assessment and Plan   1. Positive urine pregnancy test   Pregnancy verification and list of providers given    Medication List    STOP taking these medications       diclofenac 50 MG tablet  Commonly known as:  CATAFLAM      TAKE these medications       famotidine 20 MG tablet  Commonly known as:  PEPCID  Take 1 tablet (20 mg total) by mouth 2 (two) times daily.            Follow-up Information   Follow up with prenatal care provider of your choice. (  start prenatal care as soon as possible)         FRAZIER,NATALIE 09/08/2012, 2:56 PM

## 2012-09-08 NOTE — ED Notes (Signed)
Nicole Gallegos discussed results with pt . Advised to start prenatal care with provider of her choice.

## 2012-09-08 NOTE — MAU Note (Signed)
Pt took home pregnancy test it was positive. Taking medication for her nerves and want to know how far along she is.

## 2012-09-16 ENCOUNTER — Emergency Department (HOSPITAL_COMMUNITY)
Admission: EM | Admit: 2012-09-16 | Discharge: 2012-09-16 | Disposition: A | Discharge status: Home / Self Care | Payer: Medicaid Other | Attending: Emergency Medicine | Admitting: Emergency Medicine

## 2012-09-16 ENCOUNTER — Encounter (HOSPITAL_COMMUNITY): Payer: Self-pay | Admitting: Emergency Medicine

## 2012-09-16 DIAGNOSIS — H9319 Tinnitus, unspecified ear: Secondary | ICD-10-CM | POA: Insufficient documentation

## 2012-09-16 DIAGNOSIS — H9190 Unspecified hearing loss, unspecified ear: Secondary | ICD-10-CM | POA: Insufficient documentation

## 2012-09-16 DIAGNOSIS — J45909 Unspecified asthma, uncomplicated: Secondary | ICD-10-CM | POA: Insufficient documentation

## 2012-09-16 DIAGNOSIS — R509 Fever, unspecified: Secondary | ICD-10-CM | POA: Insufficient documentation

## 2012-09-16 DIAGNOSIS — R059 Cough, unspecified: Secondary | ICD-10-CM | POA: Insufficient documentation

## 2012-09-16 DIAGNOSIS — Z8659 Personal history of other mental and behavioral disorders: Secondary | ICD-10-CM | POA: Insufficient documentation

## 2012-09-16 DIAGNOSIS — Z8719 Personal history of other diseases of the digestive system: Secondary | ICD-10-CM | POA: Insufficient documentation

## 2012-09-16 DIAGNOSIS — Z79899 Other long term (current) drug therapy: Secondary | ICD-10-CM | POA: Insufficient documentation

## 2012-09-16 DIAGNOSIS — H9191 Unspecified hearing loss, right ear: Secondary | ICD-10-CM

## 2012-09-16 NOTE — ED Notes (Signed)
Patient is alert and orientedx4.  Patient was explained discharge instructions and they understood them with no questions.   

## 2012-09-16 NOTE — ED Provider Notes (Signed)
Medical screening examination/treatment/procedure(s) were performed by non-physician practitioner and as supervising physician I was immediately available for consultation/collaboration.   Gwyneth Sprout, MD 09/16/12 [phone]

## 2012-09-16 NOTE — ED Notes (Signed)
Pt sts cant hear out of right ear; large cerumen impaction noted to right; pt sts recently found out she was pregnant

## 2012-09-16 NOTE — ED Notes (Signed)
Patient says there is something going on with her right ear.  She said three days ago she noticed she could not hear out of her right ear.  I looked in her ear and you could see a significant amount of wax in it. She does no have pain, just cannot hear out of it.

## 2012-09-16 NOTE — ED Provider Notes (Signed)
History    This chart was scribed for non-physician practitioner working with Gavin Pound. Oletta Lamas, MD by Frederik Pear, ED Scribe. This patient was seen in room TR06C/TR06C and the patient's care was started at 1514.   CSN: [id number]  Arrival date & time 09/16/12  1429   First MD Initiated Contact with Patient 09/16/12 1514      Chief Complaint  Patient presents with  . Ear Fullness    (Consider location/radiation/quality/duration/timing/severity/associated sxs/prior treatment) The history is provided by the patient. No language interpreter was used.    Nicole Gallegos is a 27 y.o. female who presents to the Emergency Department complaining of sudden onset, constant, gradually worsening hearing loss in the right ear with associated intermittent, mild tinnitus, cough, and fever that began 3 days ago. She reports that her fever spiked at 101 2 days ago at home and then resolved. In ED, her temperature is 100.9. She reports that she infrequently uses Q-tips to clean her ears,but decided to clean her ears the day that her hearing started to decrease. She reports that she was able to slight hear out of ear for the past 2 days, but denies being able to hear anything out of that ear today. She denies any ear pain, pain with jaw movement, chills, neck pain, or recent injury.   Past Medical History  Diagnosis Date  . Asthma   . Panic attacks   . Depression     No meds. currently  . Constipation     Past Surgical History  Procedure Laterality Date  . Foot surgery      R foot, repair torn tissue  . Dilation and curettage of uterus      Suction D&C to remove 16 wk. fetal demise  . Wisdom tooth extraction      Family History  Problem Relation Age of Onset  . Anesthesia problems Neg Hx     History  Substance Use Topics  . Smoking status: Never Smoker   . Smokeless tobacco: Never Used  . Alcohol Use: No     Comment: Every other weekend    OB History   Grav Para Term Preterm  Abortions TAB SAB Ect Mult Living   5 2 2  0 2 1 1  0 0 2      Review of Systems  Constitutional: Positive for fever. Negative for chills.  HENT: Positive for hearing loss. Negative for neck pain.        Denies jaw pain.  All other systems reviewed and are negative.    Allergies  Chocolate  Home Medications   Current Outpatient Rx  Name  Route  Sig  Dispense  Refill  . albuterol (PROVENTIL HFA;VENTOLIN HFA) 108 (90 BASE) MCG/ACT inhaler   Inhalation   Inhale 2 puffs into the lungs every 6 (six) hours as needed for wheezing or shortness of breath.         . Prenatal Vit-Fe Fumarate-FA (PRENATAL MULTIVITAMIN) TABS   Oral   Take 1 tablet by mouth daily at 12 noon.           BP 117/78  Pulse 120  Temp(Src) 100.9 F (38.3 C) (Oral)  Resp 18  SpO2 95%  LMP 08/09/2012  Physical Exam  Nursing note and vitals reviewed. Constitutional: She is oriented to person, place, and time. She appears well-developed and well-nourished. No distress.  HENT:  Head: Normocephalic and atraumatic.  Large cerumen impaction noted to the right ear. No TMJ. No lymphadenitis.  Eyes: EOM  are normal.  Neck: Neck supple. No tracheal deviation present.  Cardiovascular: Normal rate.   Pulmonary/Chest: Effort normal. No respiratory distress.  Musculoskeletal: Normal range of motion.  Neurological: She is alert and oriented to person, place, and time.  Skin: Skin is warm and dry.  Psychiatric: She has a normal mood and affect. Her behavior is normal.    ED Course  Procedures (including critical care time)  DIAGNOSTIC STUDIES: Oxygen Saturation is 95% on room air, adequate by my interpretation.    COORDINATION OF CARE:  15:40- Discussed planned course of treatment with the patient, including ear wax removal, who is agreeable at this time.  17:01- After cerumen removal, she reports that she is still unable to hear out of her right ear. Upon exam, the cerumen is no longer present, but the ear  drum is difficult to make out due to the recent irrigation. She reports that she just found out she is [redacted] weeks pregnant with her third pregnancy. She denies taking any daily medication other than a prenatal vitamin.  Recommend pt to have close f/u with ENT for further evaluation to r/o either conductive or neurosensory loss.  Recommend not using Q-tip to clean ear.  Return precaution discussed  Since pt is pregnant will not prescribe abx, especially since pt is without signs of infection.    Labs Reviewed - No data to display No results found.   1. Hearing loss, right       MDM  BP 117/78  Pulse 120  Temp(Src) 100.9 F (38.3 C) (Oral)  Resp 18  SpO2 95%  LMP 08/09/2012  I personally performed the services described in this documentation, which was scribed in my presence. The recorded information has been reviewed and is accurate.         Fayrene Helper, PA-C 09/16/12 1710

## 2012-09-20 ENCOUNTER — Ambulatory Visit: Payer: Medicaid Other | Admitting: Obstetrics and Gynecology

## 2012-09-20 DIAGNOSIS — Z331 Pregnant state, incidental: Secondary | ICD-10-CM

## 2012-09-21 LAB — PRENATAL PANEL VII
Antibody Screen: NEGATIVE
Basophils Absolute: 0 10*3/uL (ref 0.0–0.1)
Basophils Relative: 0 % (ref 0–1)
Eosinophils Absolute: 0.1 10*3/uL (ref 0.0–0.7)
Eosinophils Relative: 1 % (ref 0–5)
MCH: 30.6 pg (ref 26.0–34.0)
MCHC: 34 g/dL (ref 30.0–36.0)
MCV: 89.9 fL (ref 78.0–100.0)
Monocytes Absolute: 0.4 10*3/uL (ref 0.1–1.0)
Platelets: 254 10*3/uL (ref 150–400)
RDW: 13.5 % (ref 11.5–15.5)
Rh Type: POSITIVE
WBC: 5.7 10*3/uL (ref 4.0–10.5)

## 2012-09-21 LAB — GC/CHLAMYDIA PROBE AMP, URINE
Chlamydia, Swab/Urine, PCR: NEGATIVE
GC Probe Amp, Urine: NEGATIVE

## 2012-09-24 ENCOUNTER — Encounter: Payer: Self-pay | Admitting: Obstetrics and Gynecology

## 2012-09-24 DIAGNOSIS — Z9289 Personal history of other medical treatment: Secondary | ICD-10-CM

## 2012-09-24 DIAGNOSIS — F410 Panic disorder [episodic paroxysmal anxiety] without agoraphobia: Secondary | ICD-10-CM | POA: Insufficient documentation

## 2012-09-24 DIAGNOSIS — J45909 Unspecified asthma, uncomplicated: Secondary | ICD-10-CM | POA: Insufficient documentation

## 2012-09-24 DIAGNOSIS — Z8669 Personal history of other diseases of the nervous system and sense organs: Secondary | ICD-10-CM | POA: Insufficient documentation

## 2012-09-24 DIAGNOSIS — G40909 Epilepsy, unspecified, not intractable, without status epilepticus: Secondary | ICD-10-CM | POA: Insufficient documentation

## 2012-09-24 DIAGNOSIS — F329 Major depressive disorder, single episode, unspecified: Secondary | ICD-10-CM

## 2012-09-24 DIAGNOSIS — F32A Depression, unspecified: Secondary | ICD-10-CM | POA: Insufficient documentation

## 2012-09-24 DIAGNOSIS — F419 Anxiety disorder, unspecified: Secondary | ICD-10-CM | POA: Insufficient documentation

## 2012-09-24 DIAGNOSIS — O09299 Supervision of pregnancy with other poor reproductive or obstetric history, unspecified trimester: Secondary | ICD-10-CM | POA: Insufficient documentation

## 2012-09-24 DIAGNOSIS — R569 Unspecified convulsions: Secondary | ICD-10-CM | POA: Insufficient documentation

## 2012-09-24 HISTORY — DX: Personal history of other medical treatment: Z92.89

## 2012-09-24 LAB — POCT URINALYSIS DIPSTICK
Bilirubin, UA: NEGATIVE
Blood, UA: NEGATIVE
Glucose, UA: NEGATIVE
Leukocytes, UA: NEGATIVE
Nitrite, UA: NEGATIVE
Urobilinogen, UA: NEGATIVE
pH, UA: 7

## 2012-09-24 LAB — HEMOGLOBINOPATHY EVALUATION
Hgb A: 97.3 % (ref 96.8–97.8)
Hgb F Quant: 0 % (ref 0.0–2.0)
Hgb S Quant: 0 %

## 2012-09-24 NOTE — Progress Notes (Unsigned)
NOB interview completed.  PNV samples given.  Pt unsure of dates, scheduled for dating [place] on Thursday 10/11/12 @ 1330 and NOB work up w/ VL @ 1400.

## 2012-10-17 ENCOUNTER — Encounter (HOSPITAL_COMMUNITY): Payer: Self-pay | Admitting: *Deleted

## 2012-10-17 ENCOUNTER — Inpatient Hospital Stay (HOSPITAL_COMMUNITY)
Admission: AD | Admit: 2012-10-17 | Discharge: 2012-10-17 | Disposition: A | Discharge status: Home / Self Care | Payer: Medicaid Other | Source: Ambulatory Visit | Attending: Obstetrics and Gynecology | Admitting: Obstetrics and Gynecology

## 2012-10-17 DIAGNOSIS — R51 Headache: Secondary | ICD-10-CM | POA: Insufficient documentation

## 2012-10-17 DIAGNOSIS — O99891 Other specified diseases and conditions complicating pregnancy: Secondary | ICD-10-CM | POA: Insufficient documentation

## 2012-10-17 DIAGNOSIS — O99340 Other mental disorders complicating pregnancy, unspecified trimester: Secondary | ICD-10-CM | POA: Insufficient documentation

## 2012-10-17 DIAGNOSIS — J3489 Other specified disorders of nose and nasal sinuses: Secondary | ICD-10-CM | POA: Insufficient documentation

## 2012-10-17 DIAGNOSIS — O26899 Other specified pregnancy related conditions, unspecified trimester: Secondary | ICD-10-CM

## 2012-10-17 DIAGNOSIS — M545 Low back pain, unspecified: Secondary | ICD-10-CM | POA: Insufficient documentation

## 2012-10-17 DIAGNOSIS — M549 Dorsalgia, unspecified: Secondary | ICD-10-CM

## 2012-10-17 DIAGNOSIS — R109 Unspecified abdominal pain: Secondary | ICD-10-CM

## 2012-10-17 DIAGNOSIS — F411 Generalized anxiety disorder: Secondary | ICD-10-CM | POA: Insufficient documentation

## 2012-10-17 LAB — URINALYSIS, ROUTINE W REFLEX MICROSCOPIC
Bilirubin Urine: NEGATIVE
Hgb urine dipstick: NEGATIVE
Nitrite: NEGATIVE
Protein, ur: NEGATIVE mg/dL
Specific Gravity, Urine: 1.015 (ref 1.005–1.030)
Urobilinogen, UA: 0.2 mg/dL (ref 0.0–1.0)

## 2012-10-17 LAB — URINE MICROSCOPIC-ADD ON

## 2012-10-17 MED ORDER — ONDANSETRON 8 MG PO TBDP
8.0000 mg | ORAL_TABLET | Freq: Once | ORAL | Status: AC
  Administered 2012-10-17: 8 mg via ORAL
  Filled 2012-10-17: qty 1

## 2012-10-17 MED ORDER — ONDANSETRON 8 MG PO TBDP: 8.0000 mg | ORAL_TABLET | Freq: Three times a day (TID) | ORAL | Status: DC | PRN

## 2012-10-17 MED ORDER — ACETAMINOPHEN-CODEINE #3 300-30 MG PO TABS
2.0000 | ORAL_TABLET | Freq: Once | ORAL | Status: AC
  Administered 2012-10-17: 2 via ORAL
  Filled 2012-10-17: qty 2

## 2012-10-17 NOTE — MAU Provider Note (Signed)
History   Nicole Gallegos is a 26y.o. BF at 10 weeks per [facility] of 05/15/13 ([redacted]w[redacted]d u/s at [facility] on 10/11/12) who presents unannounced w/ CC of intermittent lower abdominal and low back pain, and recurring headache today. Took 2 ES tylenol around 0800, and did help, but then pain returned.  Pt has had some rhinorrhea, but no other resp c/o's. No fever or chills.  Headache hurts from top of head, about midpoint, and she points it as coming forward over her temples and forehead.  Pt has had emesis x2 today, but n/v has improved in last month and she is having normal BM's (last one this AM).  States she weighed 125 at last visit, and 117 earlier on.  She has no Rx's for antiemetics she states.  No UTI s/s.  No VB, LOF, or abnl d/c.  She had her NOB w/u at [facility] on 10/11/12.  She is nervous r/e pains since had 16 week loss in the past.  She states she lifts heavy boxes at work, and lifted some yesterday around 50 lbs and stomach and back have hurt worse since.  She has tried heating pad recently.  She was given a pregnancy care book by [facility] and has been reading it.   Diet intake today: cereal and hard-boiled eggs for breakfast; lunch: 2 hamburgers and fries, nothing since.    Patient Active Problem List  Diagnosis  . Anxiety and depression  . History of blood transfusion  . Asthma, chronic  . Panic attacks  . Seizures  . Premature labor  . Prior pregnancy with fetal demise at 28 weeks    CSN: [id number]  Arrival date and time: 10/17/12 4098   First Provider Initiated Contact with Patient 10/17/12 2212      Chief Complaint  Patient presents with  . Abdominal Pain  . Back Pain  . Headache   HPI  OB History   Grav Para Term Preterm Abortions TAB SAB Ect Mult Living   5 2 2  0 2 1 1  0 0 2      Past Medical History  Diagnosis Date  . Constipation   . Depression     No meds. currently  . Panic attacks     No meds currently;used to take meds in 2009  . Asthma     Exercise induced;inhaler prn  .  Infection     Yeast;not frequent  . History of blood transfusion     Accident at 7 yoa and lost a lot of blood  . Seizures     if gets overheated;no meds  . Preterm labor 2010    2nd child;had to stop labor 2 times;due to stress  . Stress     Per pt tends to worry about small things    Past Surgical History  Procedure Laterality Date  . Foot surgery      R foot, repair torn tissue and skin graft  . Dilation and curettage of uterus      Suction D&C to remove 16 wk. fetal demise  . Wisdom tooth extraction  2008    Family History  Problem Relation Age of Onset  . Anesthesia problems Neg Hx   . Heart disease Maternal Aunt     Hole in the heart  . Heart disease Maternal Uncle     Hole in the heart  . Heart attack Maternal Grandmother     Deceased  . Hypertension Maternal Uncle   . Hypertension Maternal Grandmother   .  Hypertension Paternal Uncle   . Diabetes Paternal Grandmother   . Seizures Daughter     if gets overheated  . Seizures Daughter     if gets overheated  . Seizures Father     if gets overheated  . Bipolar disorder Cousin     maternal  . Depression Cousin     maternal  . Other Daughter     1st child had 2 vein umbilical cord; Pt did weekly NSTs.    History  Substance Use Topics  . Smoking status: Never Smoker   . Smokeless tobacco: Never Used  . Alcohol Use: No     Comment: Occasioanlly prior to pregnancy    Allergies:  Allergies  Allergen Reactions  . Chocolate Hives    Prescriptions prior to admission  Medication Sig Dispense Refill  . albuterol (PROVENTIL HFA;VENTOLIN HFA) 108 (90 BASE) MCG/ACT inhaler Inhale 2 puffs into the lungs every 6 (six) hours as needed for wheezing or shortness of breath.      . Prenatal Vit-Fe Fumarate-FA (PRENATAL MULTIVITAMIN) TABS Take 1 tablet by mouth daily at 12 noon.        ROS--see HPI Physical Exam   Blood pressure 108/84, pulse 82, temperature 98 F (36.7 C), temperature source Oral, resp. rate 18,  height 5\' 8"  (1.727 m), weight 125 lb 12.8 oz (57.063 kg), last menstrual period 08/09/2012, SpO2 100.00%. .. Results for orders placed during the hospital encounter of 10/17/12 (from the past 24 hour(s))  URINALYSIS, ROUTINE W REFLEX MICROSCOPIC     Status: Abnormal   Collection Time    10/17/12  8:07 PM      Result Value Range   Color, Urine YELLOW  YELLOW   APPearance CLEAR  CLEAR   Specific Gravity, Urine 1.015  1.005 - 1.030   pH 5.5  5.0 - 8.0   Glucose, UA NEGATIVE  NEGATIVE mg/dL   Hgb urine dipstick NEGATIVE  NEGATIVE   Bilirubin Urine NEGATIVE  NEGATIVE   Ketones, ur NEGATIVE  NEGATIVE mg/dL   Protein, ur NEGATIVE  NEGATIVE mg/dL   Urobilinogen, UA 0.2  0.0 - 1.0 mg/dL   Nitrite NEGATIVE  NEGATIVE   Leukocytes, UA TRACE (*) NEGATIVE  URINE MICROSCOPIC-ADD ON     Status: Abnormal   Collection Time    10/17/12  8:07 PM      Result Value Range   Squamous Epithelial / LPF FEW (*) RARE   WBC, UA 0-2  <3 WBC/hpf   RBC / HPF 0-2  <3 RBC/hpf   Bacteria, UA FEW (*) RARE   Physical Exam  Constitutional: She is oriented to person, place, and time. She appears well-developed. No distress.  Current BMI is 19  HENT:  Head: Normocephalic.  Eyes: Pupils are equal, round, and reactive to light.  Cardiovascular: Normal rate.   Respiratory: Effort normal.  GI: Soft. She exhibits no distension and no mass. There is no tenderness. There is no rebound and no guarding.  gravid  Genitourinary:  Pelvic exam deferred  Musculoskeletal: She exhibits no edema.  Neurological: She is alert and oriented to person, place, and time.  Skin: Skin is warm and dry.  tattoos  Psychiatric: She has a normal mood and affect. Her behavior is normal. Judgment and thought content normal.    MAU Course  Procedures 1. U/a 2. FHT=175  Assessment and Plan  1. 10 weeks 2. Abdominal & back pain in pregnancy; may be exacerbated by lifting heavy boxes yesterday at work 3.  Recurring headache 4. Improving  n/v of 1st trimester 5. Anxiety r/t previous IUFD 6. Positive FHT  1. D/c home w/ education on back and abdominal pain in pregnancy & ABC's of pregnancy; other comfort measures rev'd.   2. Given tylenol #3 x2 tabs and 8mg  ODT Zofran po before d/c; printed Rx given for Zofran prn 3. rec'd adequate hydration and small freq meals 4. Given note to take to work recommending no lifting over 25 lbs, freq BR breaks 5. Pt has appt 11/08/12 she states; f/u then, or prn worsening s/s or concerns  Evamarie Raetz H 10/17/2012, 10:15 PM

## 2012-10-17 NOTE — MAU Note (Signed)
Headaches, lower abdominal pain, lower back pain. Abd/back pain comes and goes, sharp, worse today. Headache only shortly relieved by tylenol. Brown blood on toilet paper today. Last intercourse 4 weeks ago.

## 2012-11-19 ENCOUNTER — Encounter (HOSPITAL_COMMUNITY): Payer: Self-pay | Admitting: *Deleted

## 2012-11-19 ENCOUNTER — Inpatient Hospital Stay (HOSPITAL_COMMUNITY)
Admission: AD | Admit: 2012-11-19 | Discharge: 2012-11-19 | Disposition: A | Discharge status: Home / Self Care | Payer: Medicaid Other | Source: Ambulatory Visit | Attending: Obstetrics and Gynecology | Admitting: Obstetrics and Gynecology

## 2012-11-19 DIAGNOSIS — O2390 Unspecified genitourinary tract infection in pregnancy, unspecified trimester: Secondary | ICD-10-CM | POA: Insufficient documentation

## 2012-11-19 DIAGNOSIS — B9689 Other specified bacterial agents as the cause of diseases classified elsewhere: Secondary | ICD-10-CM | POA: Insufficient documentation

## 2012-11-19 DIAGNOSIS — N760 Acute vaginitis: Secondary | ICD-10-CM | POA: Insufficient documentation

## 2012-11-19 DIAGNOSIS — O26899 Other specified pregnancy related conditions, unspecified trimester: Secondary | ICD-10-CM

## 2012-11-19 DIAGNOSIS — A499 Bacterial infection, unspecified: Secondary | ICD-10-CM | POA: Insufficient documentation

## 2012-11-19 DIAGNOSIS — M545 Low back pain, unspecified: Secondary | ICD-10-CM | POA: Insufficient documentation

## 2012-11-19 DIAGNOSIS — R109 Unspecified abdominal pain: Secondary | ICD-10-CM | POA: Insufficient documentation

## 2012-11-19 LAB — URINALYSIS, ROUTINE W REFLEX MICROSCOPIC
Glucose, UA: NEGATIVE mg/dL
Hgb urine dipstick: NEGATIVE
Protein, ur: NEGATIVE mg/dL
Specific Gravity, Urine: 1.015 (ref 1.005–1.030)
pH: 6.5 (ref 5.0–8.0)

## 2012-11-19 LAB — URINE MICROSCOPIC-ADD ON

## 2012-11-19 LAB — WET PREP, GENITAL: Yeast Wet Prep HPF POC: NONE SEEN

## 2012-11-19 NOTE — MAU Note (Signed)
Yesterday felt a little light headed after she had been out shopping.  Started having cramping  In lower abd  After that.  Cramping has continued.

## 2012-11-19 NOTE — MAU Provider Note (Signed)
History   27 yo [id number] at [redacted]w[redacted]d presents after calling the office with lower abdominal cramping and lower back pain.  Denies VB, LOF, recent IC.  Chief Complaint  Patient presents with  . Abdominal Pain    OB History   Grav Para Term Preterm Abortions TAB SAB Ect Mult Living   5 2 2  0 2 1 1  0 0 2      Past Medical History  Diagnosis Date  . Constipation   . Depression     No meds. currently  . Panic attacks     No meds currently;used to take meds in 2009  . Asthma     Exercise induced;inhaler prn  . Infection     Yeast;not frequent  . History of blood transfusion     Accident at 7 yoa and lost a lot of blood  . Preterm labor 2010    2nd child;had to stop labor 2 times;due to stress  . Stress     Per pt tends to worry about small things  . Seizures     if gets overheated;no meds, last seizure 6 yrs ago    Past Surgical History  Procedure Laterality Date  . Foot surgery      R foot, repair torn tissue and skin graft  . Dilation and curettage of uterus      Suction D&C to remove 16 wk. fetal demise  . Wisdom tooth extraction  2008    Family History  Problem Relation Age of Onset  . Anesthesia problems Neg Hx   . Heart disease Maternal Aunt     Hole in the heart  . Heart disease Maternal Uncle     Hole in the heart  . Heart attack Maternal Grandmother     Deceased  . Hypertension Maternal Uncle   . Hypertension Maternal Grandmother   . Hypertension Paternal Uncle   . Diabetes Paternal Grandmother   . Seizures Daughter     if gets overheated  . Seizures Daughter     if gets overheated  . Seizures Father     if gets overheated  . Bipolar disorder Cousin     maternal  . Depression Cousin     maternal  . Other Daughter     1st child had 2 vein umbilical cord; Pt did weekly NSTs.    History  Substance Use Topics  . Smoking status: Never Smoker   . Smokeless tobacco: Never Used  . Alcohol Use: No     Comment: Occasioanlly prior to pregnancy     Allergies:  Allergies  Allergen Reactions  . Chocolate Hives    Prescriptions prior to admission  Medication Sig Dispense Refill  . albuterol (PROVENTIL HFA;VENTOLIN HFA) 108 (90 BASE) MCG/ACT inhaler Inhale 2 puffs into the lungs every 6 (six) hours as needed for wheezing or shortness of breath.      . Prenatal Vit-Fe Fumarate-FA (PRENATAL MULTIVITAMIN) TABS Take 1 tablet by mouth daily at 12 noon.        ROS: see above HPI, all other systems are negative Physical Exam   Blood pressure 105/68, pulse 109, temperature 98.6 F (37 C), temperature source Oral, resp. rate 16, height 5\' 8"  (1.727 m), weight 128 lb (58.06 kg), last menstrual period 08/09/2012.  Chest: clear Heart: RRR Abdomen: gravid, NT Extremities: WNL  PELVIC: Pelvic exam: normal external genitalia, vulva, vagina, cervix, uterus and adnexa. VAGINA: vaginal discharge - copious, creamy, frothy, grey, malodorous and thick. Closed / TH /  High  Doppler: 163  Wet Prep: Pending GC/CT culture: Pending  Results for orders placed during the hospital encounter of 11/19/12 (from the past 24 hour(s))  URINALYSIS, ROUTINE W REFLEX MICROSCOPIC     Status: Abnormal   Collection Time    11/19/12 10:12 AM      Result Value Range   Color, Urine YELLOW  YELLOW   APPearance CLEAR  CLEAR   Specific Gravity, Urine 1.015  1.005 - 1.030   pH 6.5  5.0 - 8.0   Glucose, UA NEGATIVE  NEGATIVE mg/dL   Hgb urine dipstick NEGATIVE  NEGATIVE   Bilirubin Urine NEGATIVE  NEGATIVE   Ketones, ur NEGATIVE  NEGATIVE mg/dL   Protein, ur NEGATIVE  NEGATIVE mg/dL   Urobilinogen, UA 0.2  0.0 - 1.0 mg/dL   Nitrite NEGATIVE  NEGATIVE   Leukocytes, UA SMALL (*) NEGATIVE  URINE MICROSCOPIC-ADD ON     Status: Abnormal   Collection Time    11/19/12 10:12 AM      Result Value Range   Squamous Epithelial / LPF FEW (*) RARE   WBC, UA 0-2  <3 WBC/hpf   RBC / HPF 0-2  <3 RBC/hpf   Bacteria, UA RARE  RARE    ED Course  IUP at [redacted]w[redacted]d Cramping  in early pregnancy  D/c home Will call with any necessary therapies after lab work is completed Encouraged rest, hydration, emptying bladder often and call if symptoms get worse  ADDENDUM:   Results for orders placed during the hospital encounter of 11/19/12 (from the past 24 hour(s))  URINALYSIS, ROUTINE W REFLEX MICROSCOPIC     Status: Abnormal   Collection Time    11/19/12 10:12 AM      Result Value Range   Color, Urine YELLOW  YELLOW   APPearance CLEAR  CLEAR   Specific Gravity, Urine 1.015  1.005 - 1.030   pH 6.5  5.0 - 8.0   Glucose, UA NEGATIVE  NEGATIVE mg/dL   Hgb urine dipstick NEGATIVE  NEGATIVE   Bilirubin Urine NEGATIVE  NEGATIVE   Ketones, ur NEGATIVE  NEGATIVE mg/dL   Protein, ur NEGATIVE  NEGATIVE mg/dL   Urobilinogen, UA 0.2  0.0 - 1.0 mg/dL   Nitrite NEGATIVE  NEGATIVE   Leukocytes, UA SMALL (*) NEGATIVE  URINE MICROSCOPIC-ADD ON     Status: Abnormal   Collection Time    11/19/12 10:12 AM      Result Value Range   Squamous Epithelial / LPF FEW (*) RARE   WBC, UA 0-2  <3 WBC/hpf   RBC / HPF 0-2  <3 RBC/hpf   Bacteria, UA RARE  RARE  WET PREP, GENITAL     Status: Abnormal   Collection Time    11/19/12 12:00 PM      Result Value Range   Yeast Wet Prep HPF POC NONE SEEN  NONE SEEN   Trich, Wet Prep NONE SEEN  NONE SEEN   Clue Cells Wet Prep HPF POC MANY (*) NONE SEEN   WBC, Wet Prep HPF POC FEW (*) NONE SEEN   BV Metronidizole 500 mg BID x 7 days rx sent to pharmacy and pt notified.   Haroldine Laws CNM, MSN 11/19/2012 11:54 AM

## 2013-02-27 ENCOUNTER — Encounter (HOSPITAL_COMMUNITY): Payer: Self-pay

## 2013-02-27 ENCOUNTER — Inpatient Hospital Stay (HOSPITAL_COMMUNITY)
Admission: AD | Admit: 2013-02-27 | Discharge: 2013-02-27 | Disposition: A | Discharge status: Home / Self Care | Payer: Medicaid Other | Source: Ambulatory Visit | Attending: Obstetrics and Gynecology | Admitting: Obstetrics and Gynecology

## 2013-02-27 DIAGNOSIS — O4700 False labor before 37 completed weeks of gestation, unspecified trimester: Secondary | ICD-10-CM | POA: Insufficient documentation

## 2013-02-27 DIAGNOSIS — R109 Unspecified abdominal pain: Secondary | ICD-10-CM | POA: Insufficient documentation

## 2013-02-27 DIAGNOSIS — M549 Dorsalgia, unspecified: Secondary | ICD-10-CM | POA: Insufficient documentation

## 2013-02-27 DIAGNOSIS — O99891 Other specified diseases and conditions complicating pregnancy: Secondary | ICD-10-CM | POA: Insufficient documentation

## 2013-02-27 LAB — URINALYSIS, ROUTINE W REFLEX MICROSCOPIC
Glucose, UA: NEGATIVE mg/dL
Hgb urine dipstick: NEGATIVE
Leukocytes, UA: NEGATIVE
Protein, ur: NEGATIVE mg/dL
Specific Gravity, Urine: >1.03 — ABNORMAL HIGH (ref 1.005–1.030)
pH: 6 (ref 5.0–8.0)

## 2013-02-27 LAB — AMNISURE RUPTURE OF MEMBRANE (ROM) NOT AT ARMC: Amnisure ROM: NEGATIVE

## 2013-02-27 LAB — FETAL FIBRONECTIN: Fetal Fibronectin: NEGATIVE

## 2013-02-27 MED ORDER — NIFEDIPINE 10 MG PO CAPS
10.0000 mg | ORAL_CAPSULE | Freq: Once | ORAL | Status: AC
  Administered 2013-02-27: 10 mg via ORAL
  Filled 2013-02-27 (×2): qty 1

## 2013-02-27 NOTE — MAU Provider Note (Signed)
History   27yo, [id number] at [redacted]w[redacted]d presents to MAU with lower abd pain and cramping and back pain that stared around 3pm. Pt c/o increased pelvic pressure as well and some fluid leaking.  Denies VB, recent fever, resp or GI c/o's, UTI or PIH s/s. GFM.   Chief Complaint  Patient presents with  . Contractions    OB History   Grav Para Term Preterm Abortions TAB SAB Ect Mult Living   5 2 2  0 2 1 1  0 0 2      Past Medical History  Diagnosis Date  . Constipation   . Depression     No meds. currently  . Panic attacks     No meds currently;used to take meds in 2009  . Asthma     Exercise induced;inhaler prn  . Infection     Yeast;not frequent  . History of blood transfusion     Accident at 7 yoa and lost a lot of blood  . Preterm labor 2010    2nd child;had to stop labor 2 times;due to stress  . Stress     Per pt tends to worry about small things  . Seizures     if gets overheated;no meds, last seizure 6 yrs ago    Past Surgical History  Procedure Laterality Date  . Foot surgery      R foot, repair torn tissue and skin graft  . Dilation and curettage of uterus      Suction D&C to remove 16 wk. fetal demise  . Wisdom tooth extraction  2008    Family History  Problem Relation Age of Onset  . Anesthesia problems Neg Hx   . Heart disease Maternal Aunt     Hole in the heart  . Heart disease Maternal Uncle     Hole in the heart  . Heart attack Maternal Grandmother     Deceased  . Hypertension Maternal Uncle   . Hypertension Maternal Grandmother   . Hypertension Paternal Uncle   . Diabetes Paternal Grandmother   . Seizures Daughter     if gets overheated  . Seizures Daughter     if gets overheated  . Seizures Father     if gets overheated  . Bipolar disorder Cousin     maternal  . Depression Cousin     maternal  . Other Daughter     1st child had 2 vein umbilical cord; Pt did weekly NSTs.    History  Substance Use Topics  . Smoking status: Never Smoker   .  Smokeless tobacco: Never Used  . Alcohol Use: No     Comment: Occasioanlly prior to pregnancy    Allergies:  Allergies  Allergen Reactions  . Chocolate Hives    Prescriptions prior to admission  Medication Sig Dispense Refill  . albuterol (PROVENTIL HFA;VENTOLIN HFA) 108 (90 BASE) MCG/ACT inhaler Inhale 2 puffs into the lungs every 6 (six) hours as needed for wheezing or shortness of breath.      . Prenatal Vit-Fe Fumarate-FA (PRENATAL MULTIVITAMIN) TABS Take 1 tablet by mouth daily at 12 noon.        ROS: see HPI above, all other systems are negative   Physical Exam   Blood pressure 117/78, pulse 99, temperature 98.1 F (36.7 C), temperature source Oral, resp. rate 18, height 5\' 8"  (1.727 m), weight 137 lb 3.2 oz (62.234 kg), last menstrual period 08/09/2012.  Chest: Clear Heart: RRR Abdomen: gravid, NT Extremities: WNL  Dilation: Closed  Effacement (%): Thick Station: -3 Exam by:: Haroldine Laws CNM  FHT: Reactive NST UCs: Irritability  Results for orders placed during the hospital encounter of 02/27/13 (from the past 24 hour(s))  URINALYSIS, ROUTINE W REFLEX MICROSCOPIC     Status: Abnormal   Collection Time    02/27/13  6:55 PM      Result Value Range   Color, Urine YELLOW  YELLOW   APPearance CLEAR  CLEAR   Specific Gravity, Urine >1.030 (*) 1.005 - 1.030   pH 6.0  5.0 - 8.0   Glucose, UA NEGATIVE  NEGATIVE mg/dL   Hgb urine dipstick NEGATIVE  NEGATIVE   Bilirubin Urine NEGATIVE  NEGATIVE   Ketones, ur NEGATIVE  NEGATIVE mg/dL   Protein, ur NEGATIVE  NEGATIVE mg/dL   Urobilinogen, UA 1.0  0.0 - 1.0 mg/dL   Nitrite NEGATIVE  NEGATIVE   Leukocytes, UA NEGATIVE  NEGATIVE  AMNISURE RUPTURE OF MEMBRANE (ROM)     Status: None   Collection Time    02/27/13  7:29 PM      Result Value Range   Amnisure ROM NEGATIVE    FETAL FIBRONECTIN     Status: None   Collection Time    02/27/13  7:29 PM      Result Value Range   Fetal Fibronectin NEGATIVE  NEGATIVE      ED Course  IUP at [redacted]w[redacted]d ?PTL ?SROM  FFN Amnisure  Procardia 10 mg PO once Encouraged to call if sx continue or worsen PTL prevention eduction discussed  D/c home with precautions F/u 9/2 at an already scheduled ROB or call sooner if needed    Haroldine Laws CNM, MSN 02/27/2013 8:12 PM

## 2013-02-27 NOTE — MAU Note (Signed)
Pt reports having lower abd pain and cramping and back pian that stared around 3pm. C/O increased pelvic pressure as well and some fluid leaking

## 2013-04-05 ENCOUNTER — Encounter (HOSPITAL_COMMUNITY): Payer: Self-pay | Admitting: *Deleted

## 2013-04-05 ENCOUNTER — Inpatient Hospital Stay (HOSPITAL_COMMUNITY)
Admission: AD | Admit: 2013-04-05 | Discharge: 2013-04-05 | Disposition: A | Discharge status: Home / Self Care | Payer: Medicaid Other | Source: Ambulatory Visit | Attending: Obstetrics and Gynecology | Admitting: Obstetrics and Gynecology

## 2013-04-05 DIAGNOSIS — O4700 False labor before 37 completed weeks of gestation, unspecified trimester: Secondary | ICD-10-CM | POA: Insufficient documentation

## 2013-04-05 DIAGNOSIS — R109 Unspecified abdominal pain: Secondary | ICD-10-CM | POA: Insufficient documentation

## 2013-04-05 DIAGNOSIS — J45909 Unspecified asthma, uncomplicated: Secondary | ICD-10-CM | POA: Insufficient documentation

## 2013-04-05 DIAGNOSIS — O99891 Other specified diseases and conditions complicating pregnancy: Secondary | ICD-10-CM | POA: Insufficient documentation

## 2013-04-05 LAB — URINE MICROSCOPIC-ADD ON

## 2013-04-05 LAB — URINALYSIS, ROUTINE W REFLEX MICROSCOPIC
Bilirubin Urine: NEGATIVE
Hgb urine dipstick: NEGATIVE
Nitrite: NEGATIVE
Protein, ur: NEGATIVE mg/dL
Specific Gravity, Urine: 1.025 (ref 1.005–1.030)
Urobilinogen, UA: 1 mg/dL (ref 0.0–1.0)

## 2013-04-05 MED ORDER — NIFEDIPINE 10 MG PO CAPS: 10.0000 mg | ORAL_CAPSULE | Freq: Three times a day (TID) | ORAL | Status: DC

## 2013-04-05 NOTE — MAU Note (Signed)
Bedside u/s used by Dr. Stefano Gaul. Placenta anterior, right. AFI-Left upper quadrant 1cm, left lower quadrant 2cm, right lower quadrant 2cm, right upper quadrant 3cm.

## 2013-04-05 NOTE — MAU Note (Signed)
Patient has had several episodes of vomiting since yesterday.

## 2013-04-05 NOTE — MAU Note (Signed)
Patient presents with complaint of clear discharge x 3 days, abdominal pain x 2 days and emesis since this morning.

## 2013-04-05 NOTE — Consult Note (Addendum)
DATE: 04/05/2013  Maternity Admissions Unit History and Physical Exam for an Obstetrics Patient  Ms. Nicole Gallegos is a 27 y.o. female, [id number], at [redacted]w[redacted]d gestation, who presents for further evaluation of contractions and abdominal pain. The patient was seen in the office yesterday and at that time cultures were done. GC was negative. Chlamydia was negative. Wet prep was negative. Her cervix was closed and long. She was given Vicodin to help her rest and to relieve discomfort. The patient says she continues to have discomfort. She denies bleeding. She said she did have several episodes of "leaking fluid". She has been followed at the [facility]cology division of [facility].  Her pregnancy has been complicated by anxiety, panic attacks, depression, and asthma.. See history below.  OB History   Grav Para Term Preterm Abortions TAB SAB Ect Mult Living   5 2 2  0 2 1 1  0 0 2      Past Medical History  Diagnosis Date  . Constipation   . Depression     No meds. currently  . Panic attacks     No meds currently;used to take meds in 2009  . Asthma     Exercise induced;inhaler prn  . Infection     Yeast;not frequent  . History of blood transfusion     Accident at 7 yoa and lost a lot of blood  . Preterm labor 2010    2nd child;had to stop labor 2 times;due to stress  . Stress     Per pt tends to worry about small things  . Seizures     if gets overheated;no meds, last seizure 6 yrs ago    Prescriptions prior to admission  Medication Sig Dispense Refill  . Prenatal Vit-Fe Fumarate-FA (PRENATAL MULTIVITAMIN) TABS Take 1 tablet by mouth daily at 12 noon.      Marland Kitchen albuterol (PROVENTIL HFA;VENTOLIN HFA) 108 (90 BASE) MCG/ACT inhaler Inhale 2 puffs into the lungs every 6 (six) hours as needed for wheezing or shortness of breath.        Past Surgical History  Procedure Laterality Date  . Foot surgery      R foot, repair torn tissue and skin graft   . Dilation and curettage of uterus      Suction D&C to remove 16 wk. fetal demise  . Wisdom tooth extraction  2008    Allergies  Allergen Reactions  . Chocolate Hives    Family History: family history includes Bipolar disorder in her cousin; Depression in her cousin; Diabetes in her paternal grandmother; Heart attack in her maternal grandmother; Heart disease in her maternal aunt and maternal uncle; Hypertension in her maternal grandmother, maternal uncle, and paternal uncle; Other in her daughter; Seizures in her daughter, daughter, and father. There is no history of Anesthesia problems.  Social History:  reports that she has never smoked. She has never used smokeless tobacco. She reports that she does not drink alcohol or use illicit drugs.  Review of systems: Normal pregnancy complaints.  Admission Physical Exam:  Dilation: Closed Effacement (%): 20 Station: -3 Exam by:: Dr. Stefano Gaul Body mass index is 21.32 kg/(m^2).  Blood pressure 109/71, pulse 99, temperature 98 F (36.7 C), temperature source Oral, resp. rate 18, height 5\' 8"  (1.727 m), weight 140 lb 3.2 oz (63.594 kg), last menstrual period 08/09/2012, SpO2 100.00%, unknown if currently breastfeeding.  HEENT:  Within normal limits Chest:                   Clear Heart:                    Regular rate and rhythm Abdomen:             Gravid and nontender Extremities:          Grossly normal Neurologic exam: Grossly normal Pelvic exam:         Cervix: Closed, long. No fluid noted on speculum exam. No blood.  Fern test is negative.  Ultrasound: Single gestation, normal fluid (AFI equals 8), anterior right placenta, grade 2, active fetus. Normal fetal heart motions.  Prenatal labs: ABO, Rh:             AB/POS/-- (03/13 0934) Antibody:              NEG (03/13 0934) Rubella:                  RPR:                    NON REAC (03/13 0934)  HBsAg:                 NEGATIVE (03/13 0934)  HIV:                        NON REACTIVE (03/13 0934)  GBS:                        NST: Category 1; Contractions: Few. Uterine irritability noted .   Assessment:  [redacted]w[redacted]d gestation  Preterm uterine contractions, uterine irritability  History of anxiety  History of panic attacks  History of depression  Asthma  Plan:  Beta strep sent.  The patient will be discharged to home.  Procardia 10 mg as needed.  Return to office in one week.   Urine culture is pending from the office yesterday.  Doll Frazee V 04/05/2013, 3:09 PM

## 2013-04-05 NOTE — MAU Note (Signed)
Patient states she was seen in the office yesterday for contractions and her cervix was closed. Was given an Ambien and instructed if unable to sleep due pain to come to MAU. States she had 3 episodes of leaking clear fluid yesterday and again today. Reports good fetal movement. No bleeding.

## 2013-04-05 NOTE — Progress Notes (Signed)
Notified of pt, u/a results reviewed, efm tracing wnl, will come see pt in MAU.

## 2013-04-08 LAB — CULTURE, BETA STREP (GROUP B ONLY)

## 2013-04-17 ENCOUNTER — Inpatient Hospital Stay (HOSPITAL_COMMUNITY): Payer: Medicaid Other

## 2013-04-17 ENCOUNTER — Inpatient Hospital Stay (HOSPITAL_COMMUNITY)
Admission: AD | Admit: 2013-04-17 | Discharge: 2013-04-17 | Disposition: A | Discharge status: Home / Self Care | Payer: Medicaid Other | Source: Ambulatory Visit | Attending: Obstetrics and Gynecology | Admitting: Obstetrics and Gynecology

## 2013-04-17 ENCOUNTER — Encounter (HOSPITAL_COMMUNITY): Payer: Self-pay | Admitting: *Deleted

## 2013-04-17 DIAGNOSIS — M545 Low back pain, unspecified: Secondary | ICD-10-CM | POA: Insufficient documentation

## 2013-04-17 DIAGNOSIS — R630 Anorexia: Secondary | ICD-10-CM | POA: Insufficient documentation

## 2013-04-17 DIAGNOSIS — O99891 Other specified diseases and conditions complicating pregnancy: Secondary | ICD-10-CM | POA: Insufficient documentation

## 2013-04-17 DIAGNOSIS — R109 Unspecified abdominal pain: Secondary | ICD-10-CM | POA: Insufficient documentation

## 2013-04-17 DIAGNOSIS — O368190 Decreased fetal movements, unspecified trimester, not applicable or unspecified: Secondary | ICD-10-CM | POA: Insufficient documentation

## 2013-04-17 NOTE — MAU Note (Signed)
States baby has not been moving as much past 2 days. States has had low back pain X 1 week. Comes around to low abdomen.

## 2013-04-17 NOTE — MAU Note (Signed)
Nicole Gallegos is a 27 y.o. female presenting for decreased fetal movement for 48 hours. Also reports lack of appetite with no food yet today. Fetal movement pattern has changed and lacking strong kicks. Denies contractions, bleeding or LOF. Is very discouraged to still be pregnant.  History OB History   Grav Para Term Preterm Abortions TAB SAB Ect Mult Living   6 2 2  0 2 1 1  0 0 2     Past Medical History  Diagnosis Date  . Constipation   . Depression     No meds. currently  . Panic attacks     No meds currently;used to take meds in 2009  . Asthma     Exercise induced;inhaler prn  . Infection     Yeast;not frequent  . History of blood transfusion     Accident at 7 yoa and lost a lot of blood  . Preterm labor 2010    2nd child;had to stop labor 2 times;due to stress  . Stress     Per pt tends to worry about small things  . Seizures     if gets overheated;no meds, last seizure 6 yrs ago   Past Surgical History  Procedure Laterality Date  . Foot surgery      R foot, repair torn tissue and skin graft  . Dilation and curettage of uterus      Suction D&C to remove 16 wk. fetal demise  . Wisdom tooth extraction  2008    Dilation: Closed/50/-2  Very posterior Exam by:: Dr. Estanislado Pandy Blood pressure 106/66, pulse 119, temperature 97.8 F (36.6 C), temperature source Oral, resp. rate 18, height 5\' 7"  (1.702 m), weight 140 lb 12.8 oz (63.866 kg), last menstrual period 08/09/2012.  General Appearance: Alert, appropriate appearance for age. No acute distress HEENT Exam: Grossly normal Gastrointestinal Exam: soft, non-tender, Uterus gravid with size compatible with GA Psychiatric Exam: Alert and oriented, appropriate affect  Fetal monitoring: reviewed and reassuring Category 1  ++++++++++++++++++++++++++++++++++++++++++++++++++++++++++++++++  Vaginal exam: c/50/-3  posterior   ++++++++++++++++++++++++++++++++++++++++++++++++++++++++++++++++  Prenatal labs: ABO, Rh: AB/POS/--  (03/13 0934) Antibody: NEG (03/13 0934) Rubella:   RPR: NON REAC (03/13 0934)  HBsAg: NEGATIVE (03/13 0934)  HIV: NON REACTIVE (03/13 0934)  GBS:     +++++++++++++++++++++++++++++++++++++++++++++++++++++++++++++++  Assessment and plan:  Decreased fetal movement with reassuring NST Will add BPP Next appointment in office: 04/19/13  Silverio Lay MD

## 2013-04-19 ENCOUNTER — Encounter (HOSPITAL_COMMUNITY): Payer: Self-pay

## 2013-04-19 ENCOUNTER — Inpatient Hospital Stay (HOSPITAL_COMMUNITY)
Admission: AD | Admit: 2013-04-19 | Discharge: 2013-04-21 | DRG: 775 | Disposition: A | Discharge status: Home / Self Care | Payer: Medicaid Other | Source: Ambulatory Visit | Attending: Obstetrics and Gynecology | Admitting: Obstetrics and Gynecology

## 2013-04-19 LAB — AMNISURE RUPTURE OF MEMBRANE (ROM) NOT AT ARMC: Amnisure ROM: NEGATIVE

## 2013-04-19 LAB — GROUP B STREP BY PCR: Group B strep by PCR: NEGATIVE

## 2013-04-19 MED ORDER — OXYCODONE-ACETAMINOPHEN 5-325 MG PO TABS: 1.0000 | ORAL_TABLET | ORAL | Status: DC | PRN

## 2013-04-19 MED ORDER — LIDOCAINE HCL (PF) 1 % IJ SOLN
30.0000 mL | INTRAMUSCULAR | Status: DC | PRN
  Filled 2013-04-19 (×2): qty 30

## 2013-04-19 MED ORDER — ACETAMINOPHEN 325 MG PO TABS: 650.0000 mg | ORAL_TABLET | ORAL | Status: DC | PRN

## 2013-04-19 MED ORDER — ONDANSETRON HCL 4 MG/2ML IJ SOLN: 4.0000 mg | Freq: Four times a day (QID) | INTRAMUSCULAR | Status: DC | PRN

## 2013-04-19 MED ORDER — CITRIC ACID-SODIUM CITRATE 334-500 MG/5ML PO SOLN: 30.0000 mL | ORAL | Status: DC | PRN

## 2013-04-19 MED ORDER — OXYTOCIN BOLUS FROM INFUSION
500.0000 mL | INTRAVENOUS | Status: DC
  Administered 2013-04-19: 500 mL via INTRAVENOUS

## 2013-04-19 MED ORDER — DEXTROSE 5 % IV SOLN
5.0000 10*6.[IU] | Freq: Once | INTRAVENOUS | Status: AC
  Administered 2013-04-19: 5 10*6.[IU] via INTRAVENOUS
  Filled 2013-04-19: qty 5

## 2013-04-19 MED ORDER — OXYTOCIN 40 UNITS IN LACTATED RINGERS INFUSION - SIMPLE MED
62.5000 mL/h | INTRAVENOUS | Status: DC
  Administered 2013-04-19: 62.5 mL/h via INTRAVENOUS
  Filled 2013-04-19: qty 1000

## 2013-04-19 MED ORDER — LACTATED RINGERS IV SOLN: 500.0000 mL | INTRAVENOUS | Status: DC | PRN

## 2013-04-19 MED ORDER — LACTATED RINGERS IV SOLN
INTRAVENOUS | Status: DC
  Administered 2013-04-19: 21:00:00 via INTRAVENOUS

## 2013-04-19 MED ORDER — NALBUPHINE SYRINGE 5 MG/0.5 ML
5.0000 mg | INJECTION | INTRAMUSCULAR | Status: DC | PRN
  Administered 2013-04-19: 5 mg via INTRAVENOUS
  Filled 2013-04-19 (×2): qty 0.5

## 2013-04-19 MED ORDER — PENICILLIN G POTASSIUM 5000000 UNITS IJ SOLR
2.5000 10*6.[IU] | INTRAMUSCULAR | Status: DC
  Filled 2013-04-19 (×5): qty 2.5

## 2013-04-19 MED ORDER — IBUPROFEN 600 MG PO TABS
600.0000 mg | ORAL_TABLET | Freq: Four times a day (QID) | ORAL | Status: DC | PRN
  Administered 2013-04-19: 600 mg via ORAL
  Filled 2013-04-19: qty 1

## 2013-04-19 MED ORDER — ONDANSETRON 8 MG PO TBDP
8.0000 mg | ORAL_TABLET | ORAL | Status: AC
  Administered 2013-04-19: 8 mg via ORAL
  Filled 2013-04-19: qty 1

## 2013-04-19 NOTE — MAU Note (Signed)
Call from S. Lillard,CNM to change pt's due date to 05/16/2013 after she reviewed pt's prenatal

## 2013-04-19 NOTE — H&P (Signed)
Nicole Gallegos is a 27 y.o. female, [id number] at [redacted]w[redacted]d, presenting for active labor.  UCs began late afternoon.  SROM occurred in MAU.  Denies VB, recent fever, resp or GI c/o's, UTI or PIH s/s. GFM.   Patient Active Problem List   Diagnosis Date Noted  . Anxiety and depression 09/24/2012  . History of blood transfusion 09/24/2012  . Asthma, chronic 09/24/2012  . Panic attacks 09/24/2012  . Seizures 09/24/2012  . Premature labor 09/24/2012  . Prior pregnancy with fetal demise at 16 weeks 09/24/2012    History of present pregnancy: Patient entered care at 9 weeks.   EDC of 05/16/13 was established by LMP.   Anatomy scan:  19 weeks, with normal findings and an anterior placenta.   Additional [place] evaluations:  BPP in MAU on 04/17/13 - 8/8.   Significant prenatal events:  none   Last evaluation:  04/19/13 at [redacted]w[redacted]d   1cm / 50% / -2  OB History   Grav Para Term Preterm Abortions TAB SAB Ect Mult Living   5 2 2  0 2 1 1  0 0 2     Past Medical History  Diagnosis Date  . Constipation   . Depression     No meds. currently  . Panic attacks     No meds currently;used to take meds in 2009  . Asthma     Exercise induced;inhaler prn  . Infection     Yeast;not frequent  . History of blood transfusion     Accident at 7 yoa and lost a lot of blood  . Preterm labor 2010    2nd child;had to stop labor 2 times;due to stress  . Stress     Per pt tends to worry about small things  . Seizures     if gets overheated;no meds, last seizure 6 yrs ago   Past Surgical History  Procedure Laterality Date  . Foot surgery      R foot, repair torn tissue and skin graft  . Dilation and curettage of uterus      Suction D&C to remove 16 wk. fetal demise  . Wisdom tooth extraction  2008   Family History: family history includes Bipolar disorder in her cousin; Depression in her cousin; Diabetes in her paternal grandmother; Heart attack in her maternal grandmother; Heart disease in her maternal aunt and  maternal uncle; Hypertension in her maternal grandmother, maternal uncle, and paternal uncle; Other in her daughter; Seizures in her daughter, daughter, and father. There is no history of Anesthesia problems. Social History:  reports that she has never smoked. She has never used smokeless tobacco. She reports that she does not drink alcohol or use illicit drugs.   Prenatal Transfer Tool  Maternal Diabetes: No Genetic Screening: Normal Maternal Ultrasounds/Referrals: Normal Fetal Ultrasounds or other Referrals:  None Maternal Substance Abuse:  No Significant Maternal Medications:  None Significant Maternal Lab Results: None  GBS unknown at delivery    ROS: see HPI above, all other systems are negative  Allergies  Allergen Reactions  . Chocolate Hives     Dilation: 3.5 Effacement (%): 100 Station: -3 Exam by:: Katherene Dinino,CNM Blood pressure 112/70, pulse 84, temperature 98.2 F (36.8 C), temperature source Oral, resp. rate 22, height 5\' 8"  (1.727 m), weight 137 lb 12.8 oz (62.506 kg), last menstrual period 08/09/2012, not currently breastfeeding.  Chest clear Heart RRR without murmur Abd gravid, NT Ext: WNL  FHR: Reactive NST UCs:  Q 1-4 min  Prenatal labs: ABO,  Rh: AB/POS/-- (03/13 0934) Antibody: NEG (03/13 0934) Rubella:   Immune RPR: NON REAC (03/13 0934)  HBsAg: NEGATIVE (03/13 0934)  HIV: NON REACTIVE (03/13 0934)  GBS:  GBS PCR done at admission, results not back before delivery Sickle cell/Hgb electrophoresis:  Normal Study Pap:  According to [facility] w/u notes last pap was 11/2011 GC:  Neg Chlamydia:  Neg Genetic screenings:  AFP neg Glucola:  81 Other:  none    Assessment/Plan: IUP at [redacted]w[redacted]d Active labor SROM GBS unknown  Admit to BS per c/w Dr. Richardson Dopp as attending MD Routine [facility] orders GBS PCR collected    Rowan Blase, MSN 04/19/2013, 8:32 PM

## 2013-04-19 NOTE — MAU Note (Signed)
Pt states was at MD office today, cervix was 1cm per Dr. Estanislado Pandy. Denies bleeding or lof.

## 2013-04-19 NOTE — MAU Provider Note (Signed)
History     CSN: [id number]  Arrival date and time: 04/19/13 1753   First Provider Initiated Contact with Patient 04/19/13 1813      Chief Complaint  Patient presents with  . Labor Eval   HPI Comments: Pt is a [id number], 36wks arrives w c/o ctx that started a few hours ago, Pt had appeared to be bearing down according to RN, and RN did VE - cervix 1cm and very posterior. Pt denies any VB or LOF, reports GFM.  Of note: Pt told RN her EDC was today, however, per prenatal record EDC =11/6, Pt also told provider at visit 2wks ago that she wanted to change her EDC to 10/10, and provider explained how EDC was determined and would not be changed.  Pt seen in office today and had NST for decreased FM.       Past Medical History  Diagnosis Date  . Constipation   . Depression     No meds. currently  . Panic attacks     No meds currently;used to take meds in 2009  . Asthma     Exercise induced;inhaler prn  . Infection     Yeast;not frequent  . History of blood transfusion     Accident at 7 yoa and lost a lot of blood  . Preterm labor 2010    2nd child;had to stop labor 2 times;due to stress  . Stress     Per pt tends to worry about small things  . Seizures     if gets overheated;no meds, last seizure 6 yrs ago    Past Surgical History  Procedure Laterality Date  . Foot surgery      R foot, repair torn tissue and skin graft  . Dilation and curettage of uterus      Suction D&C to remove 16 wk. fetal demise  . Wisdom tooth extraction  2008    Family History  Problem Relation Age of Onset  . Anesthesia problems Neg Hx   . Heart disease Maternal Aunt     Hole in the heart  . Heart disease Maternal Uncle     Hole in the heart  . Heart attack Maternal Grandmother     Deceased  . Hypertension Maternal Uncle   . Hypertension Maternal Grandmother   . Hypertension Paternal Uncle   . Diabetes Paternal Grandmother   . Seizures Daughter     if gets overheated  . Seizures  Daughter     if gets overheated  . Seizures Father     if gets overheated  . Bipolar disorder Cousin     maternal  . Depression Cousin     maternal  . Other Daughter     1st child had 2 vein umbilical cord; Pt did weekly NSTs.    History  Substance Use Topics  . Smoking status: Never Smoker   . Smokeless tobacco: Never Used  . Alcohol Use: No     Comment: Occasioanlly prior to pregnancy    Allergies:  Allergies  Allergen Reactions  . Chocolate Hives    Prescriptions prior to admission  Medication Sig Dispense Refill  . albuterol (PROVENTIL HFA;VENTOLIN HFA) 108 (90 BASE) MCG/ACT inhaler Inhale 2 puffs into the lungs every 6 (six) hours as needed for wheezing or shortness of breath.      . Prenatal Vit-Fe Fumarate-FA (PRENATAL MULTIVITAMIN) TABS Take 1 tablet by mouth daily at 12 noon.        Review of Systems  All other systems reviewed and are negative.   Physical Exam   Blood pressure 112/70, pulse 84, temperature 98.2 F (36.8 C), temperature source Oral, resp. rate 22, height 5\' 8"  (1.727 m), weight 137 lb 12.8 oz (62.506 kg), last menstrual period 08/09/2012, not currently breastfeeding.  Physical Exam  Nursing note and vitals reviewed. Constitutional: She is oriented to person, place, and time. She appears well-developed and well-nourished. She appears distressed.  Pt very tense and grunting w ctx,   HENT:  Head: Normocephalic.  Cardiovascular: Normal rate.   Respiratory: Effort normal.  GI: Soft.  Musculoskeletal: Normal range of motion.  Neurological: She is alert and oriented to person, place, and time. She has normal reflexes.  Skin: Skin is warm and dry.  Psychiatric: She has a normal mood and affect. Her behavior is normal.    MAU Course  Procedures    Assessment and Plan  IUP at 36wks FHR cat 1 toco irregular Offered pt IVF's and IV pain meds or to wait 1-2 hours and recheck cervix, pt elects to wait  Reports to [place], CNM to continue  care   Rekha Hobbins M 04/19/2013, 8:06 PM

## 2013-04-19 NOTE — MAU Note (Signed)
Patient states she is having contractions every 3-4 minutes. Patient denies leaking or bleeding and has been feeling fetal movement.

## 2013-04-19 NOTE — Progress Notes (Signed)
Order obtained for ODT Zofran

## 2013-04-20 LAB — CBC
HCT: 29.6 % — ABNORMAL LOW (ref 36.0–46.0)
Hemoglobin: 9.4 g/dL — ABNORMAL LOW (ref 12.0–15.0)
MCHC: 31.8 g/dL (ref 30.0–36.0)
MCV: 75.9 fL — ABNORMAL LOW (ref 78.0–100.0)
RDW: 15.6 % — ABNORMAL HIGH (ref 11.5–15.5)

## 2013-04-20 MED ORDER — TETANUS-DIPHTH-ACELL PERTUSSIS 5-2.5-18.5 LF-MCG/0.5 IM SUSP: 0.5000 mL | Freq: Once | INTRAMUSCULAR | Status: DC

## 2013-04-20 MED ORDER — SIMETHICONE 80 MG PO CHEW: 80.0000 mg | CHEWABLE_TABLET | ORAL | Status: DC | PRN

## 2013-04-20 MED ORDER — WITCH HAZEL-GLYCERIN EX PADS: 1.0000 "application " | MEDICATED_PAD | CUTANEOUS | Status: DC | PRN

## 2013-04-20 MED ORDER — PRENATAL MULTIVITAMIN CH
1.0000 | ORAL_TABLET | Freq: Every day | ORAL | Status: DC
  Administered 2013-04-20 – 2013-04-21 (×2): 1 via ORAL
  Filled 2013-04-20 (×2): qty 1

## 2013-04-20 MED ORDER — OXYCODONE-ACETAMINOPHEN 5-325 MG PO TABS
1.0000 | ORAL_TABLET | ORAL | Status: DC | PRN
  Administered 2013-04-21: 1 via ORAL
  Filled 2013-04-20: qty 1

## 2013-04-20 MED ORDER — IBUPROFEN 600 MG PO TABS
600.0000 mg | ORAL_TABLET | Freq: Four times a day (QID) | ORAL | Status: DC
  Administered 2013-04-20 – 2013-04-21 (×6): 600 mg via ORAL
  Filled 2013-04-20 (×6): qty 1

## 2013-04-20 MED ORDER — DIPHENHYDRAMINE HCL 25 MG PO CAPS: 25.0000 mg | ORAL_CAPSULE | Freq: Four times a day (QID) | ORAL | Status: DC | PRN

## 2013-04-20 MED ORDER — SENNOSIDES-DOCUSATE SODIUM 8.6-50 MG PO TABS
2.0000 | ORAL_TABLET | ORAL | Status: DC
  Administered 2013-04-20: 2 via ORAL
  Filled 2013-04-20 (×2): qty 2

## 2013-04-20 MED ORDER — ONDANSETRON HCL 4 MG/2ML IJ SOLN: 4.0000 mg | INTRAMUSCULAR | Status: DC | PRN

## 2013-04-20 MED ORDER — LANOLIN HYDROUS EX OINT: TOPICAL_OINTMENT | CUTANEOUS | Status: DC | PRN

## 2013-04-20 MED ORDER — DIBUCAINE 1 % RE OINT: 1.0000 "application " | TOPICAL_OINTMENT | RECTAL | Status: DC | PRN

## 2013-04-20 MED ORDER — ZOLPIDEM TARTRATE 5 MG PO TABS: 5.0000 mg | ORAL_TABLET | Freq: Every evening | ORAL | Status: DC | PRN

## 2013-04-20 MED ORDER — BENZOCAINE-MENTHOL 20-0.5 % EX AERO: 1.0000 "application " | INHALATION_SPRAY | CUTANEOUS | Status: DC | PRN

## 2013-04-20 MED ORDER — ONDANSETRON HCL 4 MG PO TABS: 4.0000 mg | ORAL_TABLET | ORAL | Status: DC | PRN

## 2013-04-20 MED ORDER — INFLUENZA VAC SPLIT QUAD 0.5 ML IM SUSP
0.5000 mL | INTRAMUSCULAR | Status: AC
  Administered 2013-04-21: 0.5 mL via INTRAMUSCULAR
  Filled 2013-04-20: qty 0.5

## 2013-04-20 NOTE — Progress Notes (Signed)
Post Partum Day 1 Subjective: no complaints, up ad lib, voiding and tolerating PO  Objective: Blood pressure 104/69, pulse 67, temperature 98.3 F (36.8 C), temperature source Oral, resp. rate 18, height 5\' 8"  (1.727 m), weight 62.506 kg (137 lb 12.8 oz), last menstrual period 08/09/2012, SpO2 100.00%, not currently breastfeeding.  Physical Exam:  General: alert and cooperative Lochia: appropriate Uterine Fundus: firm Incision: NA DVT Evaluation: No evidence of DVT seen on physical exam.   Recent Labs  04/20/13 0547  HGB 9.4*  HCT 29.6*    Assessment/Plan: Plan for discharge tomorrow Doing well s/p svd.. Routine postpartum care    LOS: 1 day   Tidus Upchurch J. 04/20/2013, 12:32 PM

## 2013-04-21 MED ORDER — MEDROXYPROGESTERONE ACETATE 150 MG/ML IM SUSP
150.0000 mg | Freq: Once | INTRAMUSCULAR | Status: AC
  Administered 2013-04-21: 150 mg via INTRAMUSCULAR
  Filled 2013-04-21: qty 1

## 2013-04-21 MED ORDER — IBUPROFEN 600 MG PO TABS: 600.0000 mg | ORAL_TABLET | Freq: Four times a day (QID) | ORAL | Status: DC

## 2013-04-21 NOTE — Discharge Summary (Signed)
Vaginal Delivery Discharge Summary  Nicole Gallegos  DOB:    [birth year] MRN:    [id number] CSN:    [id number]  Date of admission:                  04/19/13  Date of discharge:                   04/21/13  Procedures this admission:  Date of Delivery: 04/19/13 by Shela Commons.Oxley, CNM   Newborn Data:  Live born female  Birth Weight: 5 lb 8.7 oz (2515 g) APGAR: 8, 9  Infant remains a pt in central nursery for light therapy   History of Present Illness:  Nicole Gallegos is a 27 y.o. female, [phone], who presents at [redacted]w[redacted]d weeks gestation. The patient has been followed at the [facility] division of [facility]. She was admitted onset of labor. Her pregnancy has been complicated by: none.  Hospital course:  The patient was admitted for labor.   Her labor was not complicated. She proceeded to have a vaginal delivery of a healthy infant. Her delivery was not complicated. Her postpartum course was not complicated.  She was discharged to home on postpartum day 2 doing well.  Feeding:  bottle  Contraception:  Depo-Provera  Discharge hemoglobin:  Hemoglobin  Date Value Range Status  04/20/2013 9.4* 12.0 - 15.0 g/dL Final     HCT  Date Value Range Status  04/20/2013 29.6* 36.0 - 46.0 % Final    Discharge Physical Exam:   General: alert and no distress Lochia: appropriate Uterine Fundus: firm Incision: healing well DVT Evaluation: No evidence of DVT seen on physical exam. Negative Homan's sign. No significant calf/ankle edema.  Intrapartum Procedures: spontaneous vaginal delivery Postpartum Procedures: none Complications-Operative and Postpartum: none  Discharge Diagnoses: preterm vaginal delivery at 36wks   Discharge Information:  Activity:           pelvic rest Diet:                routine Medications: PNV and Ibuprofen Condition:      stable Instructions:   Postpartum Care After Vaginal Delivery  After you  deliver your newborn (postpartum period), the usual stay in the hospital is 24 72 hours. If there were problems with your labor or delivery, or if you have other medical problems, you might be in the hospital longer.  While you are in the hospital, you will receive help and instructions on how to care for yourself and your newborn during the postpartum period.  While you are in the hospital:  Be sure to tell your nurses if you have pain or discomfort, as well as where you feel the pain and what makes the pain worse.  If you had an incision made near your vagina (episiotomy) or if you had some tearing during delivery, the nurses may put ice packs on your episiotomy or tear. The ice packs may help to reduce the pain and swelling.  If you are breastfeeding, you may feel uncomfortable contractions of your uterus for a couple of weeks. This is normal. The contractions help your uterus get back to normal size.  It is normal to have some bleeding after delivery.  For the first 1 3 days after delivery, the flow is red and the amount may be similar to a period.  It is common for the flow to start and stop.  In the first few days, you may pass  some small clots. Let your nurses know if you begin to pass large clots or your flow increases.  Do not  flush blood clots down the toilet before having the nurse look at them.  During the next 3 10 days after delivery, your flow should become more watery and pink or brown-tinged in color.  Ten to fourteen days after delivery, your flow should be a small amount of yellowish-white discharge.  The amount of your flow will decrease over the first few weeks after delivery. Your flow may stop in 6 8 weeks. Most women have had their flow stop by 12 weeks after delivery.  You should change your sanitary pads frequently.  Wash your hands thoroughly with soap and water for at least 20 seconds after changing pads, using the toilet, or before holding or feeding your  newborn.  You should feel like you need to empty your bladder within the first 6 8 hours after delivery.  In case you become weak, lightheaded, or faint, call your nurse before you get out of bed for the first time and before you take a shower for the first time.  Within the first few days after delivery, your breasts may begin to feel tender and full. This is called engorgement. Breast tenderness usually goes away within 48 72 hours after engorgement occurs. You may also notice milk leaking from your breasts. If you are not breastfeeding, do not stimulate your breasts. Breast stimulation can make your breasts produce more milk.  Spending as much time as possible with your newborn is very important. During this time, you and your newborn can feel close and get to know each other. Having your newborn stay in your room (rooming in) will help to strengthen the bond with your newborn. It will give you time to get to know your newborn and become comfortable caring for your newborn.  Your hormones change after delivery. Sometimes the hormone changes can temporarily cause you to feel sad or tearful. These feelings should not last more than a few days. If these feelings last longer than that, you should talk to your caregiver.  If desired, talk to your caregiver about methods of family planning or contraception.  Talk to your caregiver about immunizations. Your caregiver may want you to have the following immunizations before leaving the hospital:  Tetanus, diphtheria, and pertussis (Tdap) or tetanus and diphtheria (Td) immunization. It is very important that you and your family (including grandparents) or others caring for your newborn are up-to-date with the Tdap or Td immunizations. The Tdap or Td immunization can help protect your newborn from getting ill.  Rubella immunization.  Varicella (chickenpox) immunization.  Influenza immunization. You should receive this annual immunization if you did  not receive the immunization during your pregnancy. Document Released: 04/24/2007 Document Revised: 03/21/2012 Document Reviewed: 02/22/2012 [facility] Patient Information 2014 [place], [place].   Postpartum Depression and Baby Blues  The postpartum period begins right after the birth of a baby. During this time, there is often a great amount of joy and excitement. It is also a time of considerable changes in the life of the parent(s). Regardless of how many times a mother gives birth, each child brings new challenges and dynamics to the family. It is not unusual to have feelings of excitement accompanied by confusing shifts in moods, emotions, and thoughts. All mothers are at risk of developing postpartum depression or the "baby blues." These mood changes can occur right after giving birth, or they may occur many  months after giving birth. The baby blues or postpartum depression can be mild or severe. Additionally, postpartum depression can resolve rather quickly, or it can be a long-term condition. CAUSES Elevated hormones and their rapid decline are thought to be a main cause of postpartum depression and the baby blues. There are a number of hormones that radically change during and after pregnancy. Estrogen and progesterone usually decrease immediately after delivering your baby. The level of thyroid hormone and various cortisol steroids also rapidly drop. Other factors that play a major role in these changes include major life events and genetics.  RISK FACTORS If you have any of the following risks for the baby blues or postpartum depression, know what symptoms to watch out for during the postpartum period. Risk factors that may increase the likelihood of getting the baby blues or postpartum depression include:  Havinga personal or family history of depression.  Having depression while being pregnant.  Having premenstrual or oral contraceptive-associated mood issues.  Having exceptional life  stress.  Having marital conflict.  Lacking a social support network.  Having a baby with special needs.  Having health problems such as diabetes. SYMPTOMS Baby blues symptoms include:  Brief fluctuations in mood, such as going from extreme happiness to sadness.  Decreased concentration.  Difficulty sleeping.  Crying spells, tearfulness.  Irritability.  Anxiety. Postpartum depression symptoms typically begin within the first month after giving birth. These symptoms include:  Difficulty sleeping or excessive sleepiness.  Marked weight loss.  Agitation.  Feelings of worthlessness.  Lack of interest in activity or food. Postpartum psychosis is a very concerning condition and can be dangerous. Fortunately, it is rare. Displaying any of the following symptoms is cause for immediate medical attention. Postpartum psychosis symptoms include:  Hallucinations and delusions.  Bizarre or disorganized behavior.  Confusion or disorientation. DIAGNOSIS  A diagnosis is made by an evaluation of your symptoms. There are no medical or lab tests that lead to a diagnosis, but there are various questionnaires that a caregiver may use to identify those with the baby blues, postpartum depression, or psychosis. Often times, a screening tool called the [place] Postnatal Depression Scale is used to diagnose depression in the postpartum period.  TREATMENT The baby blues usually goes away on its own in 1 to 2 weeks. Social support is often all that is needed. You should be encouraged to get adequate sleep and rest. Occasionally, you may be given medicines to help you sleep.  Postpartum depression requires treatment as it can last several months or longer if it is not treated. Treatment may include individual or group therapy, medicine, or both to address any social, physiological, and psychological factors that may play a role in the depression. Regular exercise, a healthy diet, rest, and social  support may also be strongly recommended.  Postpartum psychosis is more serious and needs treatment right away. Hospitalization is often needed. HOME CARE INSTRUCTIONS  Get as much rest as you can. Nap when the baby sleeps.  Exercise regularly. Some women find yoga and walking to be beneficial.  Eat a balanced and nourishing diet.  Do little things that you enjoy. Have a cup of tea, take a bubble bath, read your favorite magazine, or listen to your favorite music.  Avoid alcohol.  Ask for help with household chores, cooking, grocery shopping, or running errands as needed. Do not try to do everything.  Talk to people close to you about how you are feeling. Get support from your partner, family members,  friends, or other new moms.  Try to stay positive in how you think. Think about the things you are grateful for.  Do not spend a lot of time alone.  Only take medicine as directed by your caregiver.  Keep all your postpartum appointments.  Let your caregiver know if you have any concerns. SEEK MEDICAL CARE IF: You are having a reaction or problems with your medicine. SEEK IMMEDIATE MEDICAL CARE IF:  You have suicidal feelings.  You feel you may harm the baby or someone else. Document Released: 03/31/2004 Document Revised: 09/19/2011 Document Reviewed: 05/03/2011 [facility] Patient Information 2014 [place], [place].   Discharge to: home  Follow-up Information   Follow up with [facility] In 6 weeks.   Specialty:  Obstetrics and Gynecology   Contact information:   [address] [place] [address] [phone]       Malissa Hippo 04/21/2013

## 2014-05-12 ENCOUNTER — Encounter (HOSPITAL_COMMUNITY): Payer: Self-pay

## 2014-09-18 ENCOUNTER — Encounter (HOSPITAL_COMMUNITY): Payer: Self-pay | Admitting: Emergency Medicine

## 2014-09-18 ENCOUNTER — Emergency Department (HOSPITAL_COMMUNITY)
Admission: EM | Admit: 2014-09-18 | Discharge: 2014-09-18 | Disposition: A | Discharge status: Home / Self Care | Payer: Medicaid Other | Attending: Emergency Medicine | Admitting: Emergency Medicine

## 2014-09-18 DIAGNOSIS — R45851 Suicidal ideations: Secondary | ICD-10-CM | POA: Insufficient documentation

## 2014-09-18 DIAGNOSIS — J45909 Unspecified asthma, uncomplicated: Secondary | ICD-10-CM | POA: Insufficient documentation

## 2014-09-18 DIAGNOSIS — R0789 Other chest pain: Secondary | ICD-10-CM | POA: Insufficient documentation

## 2014-09-18 DIAGNOSIS — Z8719 Personal history of other diseases of the digestive system: Secondary | ICD-10-CM | POA: Insufficient documentation

## 2014-09-18 DIAGNOSIS — Z79899 Other long term (current) drug therapy: Secondary | ICD-10-CM | POA: Insufficient documentation

## 2014-09-18 DIAGNOSIS — Z8619 Personal history of other infectious and parasitic diseases: Secondary | ICD-10-CM | POA: Insufficient documentation

## 2014-09-18 LAB — COMPREHENSIVE METABOLIC PANEL
ALBUMIN: 4.2 g/dL (ref 3.5–5.2)
ALT: 36 U/L — ABNORMAL HIGH (ref 0–35)
AST: 24 U/L (ref 0–37)
Alkaline Phosphatase: 116 U/L (ref 39–117)
Anion gap: 6 (ref 5–15)
BILIRUBIN TOTAL: 0.8 mg/dL (ref 0.3–1.2)
BUN: 10 mg/dL (ref 6–23)
CALCIUM: 9.3 mg/dL (ref 8.4–10.5)
CHLORIDE: 109 mmol/L (ref 96–112)
CO2: 25 mmol/L (ref 19–32)
Creatinine, Ser: 1.03 mg/dL (ref 0.50–1.10)
GFR calc Af Amer: 86 mL/min — ABNORMAL LOW (ref >90)
GFR, EST NON AFRICAN AMERICAN: 74 mL/min — AB (ref >90)
Glucose, Bld: 121 mg/dL — ABNORMAL HIGH (ref 70–99)
Potassium: 3.9 mmol/L (ref 3.5–5.1)
Sodium: 140 mmol/L (ref 135–145)
TOTAL PROTEIN: 7.3 g/dL (ref 6.0–8.3)

## 2014-09-18 LAB — CBC
HCT: 45.1 % (ref 36.0–46.0)
Hemoglobin: 15 g/dL (ref 12.0–15.0)
MCH: 31 pg (ref 26.0–34.0)
MCHC: 33.3 g/dL (ref 30.0–36.0)
MCV: 93.2 fL (ref 78.0–100.0)
Platelets: 242 10*3/uL (ref 150–400)
RBC: 4.84 MIL/uL (ref 3.87–5.11)
RDW: 12.8 % (ref 11.5–15.5)
WBC: 5.8 10*3/uL (ref 4.0–10.5)

## 2014-09-18 LAB — RAPID URINE DRUG SCREEN, HOSP PERFORMED
Amphetamines: NOT DETECTED
BARBITURATES: NOT DETECTED
BENZODIAZEPINES: NOT DETECTED
Cocaine: NOT DETECTED
OPIATES: NOT DETECTED
Tetrahydrocannabinol: NOT DETECTED

## 2014-09-18 LAB — ETHANOL

## 2014-09-18 LAB — I-STAT TROPONIN, ED: Troponin i, poc: 0 ng/mL (ref 0.00–0.08)

## 2014-09-18 LAB — SALICYLATE LEVEL

## 2014-09-18 LAB — ACETAMINOPHEN LEVEL

## 2014-09-18 MED ORDER — ALBUTEROL SULFATE (2.5 MG/3ML) 0.083% IN NEBU
5.0000 mg | INHALATION_SOLUTION | Freq: Once | RESPIRATORY_TRACT | Status: AC
  Administered 2014-09-18: 5 mg via RESPIRATORY_TRACT
  Filled 2014-09-18: qty 6

## 2014-09-18 MED ORDER — ALBUTEROL SULFATE HFA 108 (90 BASE) MCG/ACT IN AERS: 2.0000 | INHALATION_SPRAY | RESPIRATORY_TRACT | Status: DC

## 2014-09-18 MED ORDER — PREDNISONE 20 MG PO TABS: 40.0000 mg | ORAL_TABLET | Freq: Every day | ORAL | Status: AC

## 2014-09-18 MED ORDER — PREDNISONE 20 MG PO TABS
60.0000 mg | ORAL_TABLET | ORAL | Status: AC
  Administered 2014-09-18: 60 mg via ORAL
  Filled 2014-09-18: qty 3

## 2014-09-18 NOTE — Discharge Instructions (Signed)
[facility] Ref[facility] Information:    Appointment Information for Nicole Gallegos: [facility] [address] [place] [address]  - View Map    Phone: [phone]  Appointment(s) Psychiatrist: Dr. Penelope GalasHeading Wednesday, September 24, 2014 @ 8pm Therapist: Tilden FossaQuandra Gallegos Monday, September 22, 2014 @ 3pm   Chest Pain - Breathing Difficulty  Your evaluation in terms of your chest pain and breathing difficulty has been largely reassuring.  Her symptoms are likely due to bronchitis and/or asthma exacerbation.  Please take all medication as directed and be sure to follow-up with your primary care physician.

## 2014-09-18 NOTE — ED Notes (Signed)
Returned belongings to pt.

## 2014-09-18 NOTE — ED Provider Notes (Signed)
CSN: [id number]     Arrival date & time 09/18/14  1556 History   First MD Initiated Contact with Patient 09/18/14 1612     Chief Complaint  Patient presents with  . Chest Pain  . Suicidal     (Consider location/radiation/quality/duration/timing/severity/associated sxs/prior Treatment) HPI Patient presents with 2 primary complaints. He states that over the past 2 days she has had new chest pain, dyspnea.  Pain is left-sided, superior, sore. No clear precipitant. With episodes of pain there is associated dyspnea, lightheadedness, but no syncope, no cough, no fever, chills. Symptoms improve with rest.  Patient also complains of intermittent episodes of suicidal ideation. She states that for the last few months, perhaps longer, she has had multiple episodes of suicidal thoughts. However, she states that she has no specific plan.  Past Medical History  Diagnosis Date  . Constipation   . Depression     No meds. currently  . Panic attacks     No meds currently;used to take meds in 2009  . Asthma     Exercise induced;inhaler prn  . Infection     Yeast;not frequent  . History of blood transfusion     Accident at 7 yoa and lost a lot of blood  . Preterm labor 2010    2nd child;had to stop labor 2 times;due to stress  . Stress     Per pt tends to worry about small things  . Seizures     if gets overheated;no meds, last seizure 6 yrs ago   Past Surgical History  Procedure Laterality Date  . Foot surgery      R foot, repair torn tissue and skin graft  . Dilation and curettage of uterus      Suction D&C to remove 16 wk. fetal demise  . Wisdom tooth extraction  2008   Family History  Problem Relation Age of Onset  . Anesthesia problems Neg Hx   . Heart disease Maternal Aunt     Hole in the heart  . Heart disease Maternal Uncle     Hole in the heart  . Heart attack Maternal Grandmother     Deceased  . Hypertension Maternal Uncle   . Hypertension Maternal Grandmother   .  Hypertension Paternal Uncle   . Diabetes Paternal Grandmother   . Seizures Daughter     if gets overheated  . Seizures Daughter     if gets overheated  . Seizures Father     if gets overheated  . Bipolar disorder Cousin     maternal  . Depression Cousin     maternal  . Other Daughter     1st child had 2 vein umbilical cord; Pt did weekly NSTs.   History  Substance Use Topics  . Smoking status: Never Smoker   . Smokeless tobacco: Never Used  . Alcohol Use: No     Comment: Occasioanlly prior to pregnancy   OB History    Gravida Para Term Preterm AB TAB SAB Ectopic Multiple Living   0 0 3     Review of Systems  Constitutional:       Per HPI, otherwise negative  HENT:       Per HPI, otherwise negative  Respiratory:       Per HPI, otherwise negative  Cardiovascular:       Per HPI, otherwise negative  Gastrointestinal: Negative for vomiting.  Endocrine:       Negative aside  from HPI  Genitourinary:       Neg aside from HPI   Musculoskeletal:       Per HPI, otherwise negative  Skin: Negative.   Neurological: Negative for syncope.      Allergies  Chocolate  Home Medications   Prior to Admission medications   Medication Sig Start Date End Date Taking? Authorizing Provider  albuterol (PROVENTIL HFA;VENTOLIN HFA) 108 (90 BASE) MCG/ACT inhaler Inhale 2 puffs into the lungs every 6 (six) hours as needed for wheezing or shortness of breath.   Yes Historical Provider, MD  Prenatal Vit-Fe Fumarate-FA (PRENATAL MULTIVITAMIN) TABS Take 1 tablet by mouth daily at 12 noon.   Yes Historical Provider, MD  ibuprofen (ADVIL,MOTRIN) 600 MG tablet Take 1 tablet (600 mg total) by mouth every 6 (six) hours. 04/21/13   Sanda KleinShelley Lillard, CNM   BP 126/80 mmHg  Pulse 95  Temp(Src) 98.2 F (36.8 C) (Oral)  Resp 16  SpO2 100% Physical Exam  Constitutional: She is oriented to person, place, and time. She appears well-developed and well-nourished. No distress.  HENT:   Head: Normocephalic and atraumatic.  Eyes: Conjunctivae and EOM are normal.  Cardiovascular: Normal rate and regular rhythm.   Pulmonary/Chest: Effort normal and breath sounds normal. No stridor. No respiratory distress.  Abdominal: She exhibits no distension.  Musculoskeletal: She exhibits no edema.  Neurological: She is alert and oriented to person, place, and time. No cranial nerve deficit.  Skin: Skin is warm and dry.  Psychiatric: She has a normal mood and affect. Her speech is normal and behavior is normal. She expresses suicidal ideation. She expresses no suicidal plans.  Nursing note and vitals reviewed.   ED Course  Procedures (including critical care time) Labs Review Labs Reviewed  ACETAMINOPHEN LEVEL - Abnormal; Notable for the following:    Acetaminophen (Tylenol), Serum <10.0 (*)    All other components within normal limits  COMPREHENSIVE METABOLIC PANEL - Abnormal; Notable for the following:    Glucose, Bld 121 (*)    ALT 36 (*)    GFR calc non Af Amer 74 (*)    GFR calc Af Amer 86 (*)    All other components within normal limits  CBC  ETHANOL  SALICYLATE LEVEL  URINE RAPID DRUG SCREEN (HOSP PERFORMED)  I-STAT TROPOININ, ED      EKG Interpretation   Date/Time:  Thursday September 18 2014 16:09:04 EST Ventricular Rate:  90 PR Interval:  111 QRS Duration: 71 QT Interval:  365 QTC Calculation: 447 R Axis:   78 Text Interpretation:  Sinus rhythm Borderline short PR interval Abnormal Q  suggests anterior infarct Borderline T abnormalities, anterior leads Sinus  rhythm Non-specific intra-ventricular conduction delay T wave abnormality  Abnormal ekg Confirmed by Gerhard MunchLOCKWOOD, Lareen Mullings  MD [phone]([id number]) on 09/18/2014 4:12:09  PM      Update:  [facility].  Patient does not meet criteria for inpatient eval.  She has been scheduled an outpatient visit.   6:53 PM Patient improved following nebs MDM   Patient presents with 2  ongoing concerns. Patient has episodic suicidal thoughts, but no plan. Patient's risk profile is relatively low aside from history of panic attacks, depression. She does not smoke, is not disenfranchised, is not a substance abuser.  [facility] has evaluated the patient, scheduled outpatient follow-up.  Patient's other concern, chest pain, breathing difficulty, is likely due to asthma, with low suspicion for ongoing ACS that her risk profile. No evidence for  PE. Patient improved here with bronchodilators, was discharged to follow-up with primary care, [facility].    Gerhard Munch, MD 09/18/14 [phone]
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
## 2014-09-18 NOTE — ED Notes (Signed)
Pt reports intermittent CP and dizziness since Friday. Pt reports pain began again at 1530 today with dizziness and nausea and feeling of sob. Pt speaking in complete sentences.

## 2014-10-31 ENCOUNTER — Other Ambulatory Visit: Payer: Self-pay | Admitting: Urology

## 2014-10-31 DIAGNOSIS — N6002 Solitary cyst of left breast: Secondary | ICD-10-CM

## 2014-11-04 ENCOUNTER — Other Ambulatory Visit: Payer: Medicaid Other

## 2015-01-05 ENCOUNTER — Emergency Department (HOSPITAL_COMMUNITY)
Admission: EM | Admit: 2015-01-05 | Discharge: 2015-01-05 | Disposition: A | Discharge status: Home / Self Care | Payer: Medicaid Other | Attending: Emergency Medicine | Admitting: Emergency Medicine

## 2015-01-05 ENCOUNTER — Emergency Department (HOSPITAL_COMMUNITY): Payer: Medicaid Other

## 2015-01-05 ENCOUNTER — Encounter (HOSPITAL_COMMUNITY): Payer: Self-pay | Admitting: Emergency Medicine

## 2015-01-05 DIAGNOSIS — Z79899 Other long term (current) drug therapy: Secondary | ICD-10-CM | POA: Insufficient documentation

## 2015-01-05 DIAGNOSIS — R079 Chest pain, unspecified: Secondary | ICD-10-CM | POA: Insufficient documentation

## 2015-01-05 DIAGNOSIS — R51 Headache: Secondary | ICD-10-CM | POA: Diagnosis not present

## 2015-01-05 DIAGNOSIS — Z8719 Personal history of other diseases of the digestive system: Secondary | ICD-10-CM | POA: Insufficient documentation

## 2015-01-05 DIAGNOSIS — Z8659 Personal history of other mental and behavioral disorders: Secondary | ICD-10-CM | POA: Diagnosis not present

## 2015-01-05 DIAGNOSIS — Z8619 Personal history of other infectious and parasitic diseases: Secondary | ICD-10-CM | POA: Insufficient documentation

## 2015-01-05 DIAGNOSIS — J45909 Unspecified asthma, uncomplicated: Secondary | ICD-10-CM | POA: Diagnosis not present

## 2015-01-05 DIAGNOSIS — R519 Headache, unspecified: Secondary | ICD-10-CM

## 2015-01-05 HISTORY — DX: Migraine, unspecified, not intractable, without status migrainosus: G43.909

## 2015-01-05 LAB — BASIC METABOLIC PANEL
Anion gap: 8 (ref 5–15)
BUN: 13 mg/dL (ref 6–20)
CALCIUM: 9.4 mg/dL (ref 8.9–10.3)
CO2: 27 mmol/L (ref 22–32)
Chloride: 107 mmol/L (ref 101–111)
Creatinine, Ser: 1.14 mg/dL — ABNORMAL HIGH (ref 0.44–1.00)
GFR calc Af Amer: >60 mL/min (ref >60)
Glucose, Bld: 98 mg/dL (ref 65–99)
Potassium: 3.6 mmol/L (ref 3.5–5.1)
Sodium: 142 mmol/L (ref 135–145)

## 2015-01-05 LAB — CBC
HEMATOCRIT: 42.9 % (ref 36.0–46.0)
Hemoglobin: 14.4 g/dL (ref 12.0–15.0)
MCH: 30.8 pg (ref 26.0–34.0)
MCHC: 33.6 g/dL (ref 30.0–36.0)
MCV: 91.9 fL (ref 78.0–100.0)
Platelets: 208 10*3/uL (ref 150–400)
RBC: 4.67 MIL/uL (ref 3.87–5.11)
RDW: 12.9 % (ref 11.5–15.5)
WBC: 5.6 10*3/uL (ref 4.0–10.5)

## 2015-01-05 LAB — I-STAT TROPONIN, ED
TROPONIN I, POC: 0 ng/mL (ref 0.00–0.08)
Troponin i, poc: 0 ng/mL (ref 0.00–0.08)

## 2015-01-05 MED ORDER — DIPHENHYDRAMINE HCL 50 MG/ML IJ SOLN
25.0000 mg | Freq: Once | INTRAMUSCULAR | Status: AC
  Administered 2015-01-05: 25 mg via INTRAVENOUS
  Filled 2015-01-05: qty 1

## 2015-01-05 MED ORDER — METOCLOPRAMIDE HCL 10 MG PO TABS: 10.0000 mg | ORAL_TABLET | Freq: Four times a day (QID) | ORAL | Status: DC

## 2015-01-05 MED ORDER — KETOROLAC TROMETHAMINE 30 MG/ML IJ SOLN
30.0000 mg | Freq: Once | INTRAMUSCULAR | Status: AC
  Administered 2015-01-05: 30 mg via INTRAVENOUS
  Filled 2015-01-05: qty 1

## 2015-01-05 MED ORDER — METOCLOPRAMIDE HCL 5 MG/ML IJ SOLN
10.0000 mg | Freq: Once | INTRAMUSCULAR | Status: AC
  Administered 2015-01-05: 10 mg via INTRAVENOUS
  Filled 2015-01-05: qty 2

## 2015-01-05 MED ORDER — SODIUM CHLORIDE 0.9 % IV BOLUS (SEPSIS)
1000.0000 mL | Freq: Once | INTRAVENOUS | Status: AC
  Administered 2015-01-05: 1000 mL via INTRAVENOUS

## 2015-01-05 NOTE — ED Notes (Signed)
PA at bedside.

## 2015-01-05 NOTE — ED Provider Notes (Signed)
CSN: [id number]     Arrival date & time 01/05/15  1619 History   First MD Initiated Contact with Patient 01/05/15 1820     Chief Complaint  Patient presents with  . Chest Pain  . Migraine     (Consider location/radiation/quality/duration/timing/severity/associated sxs/prior Treatment) HPI  Nicole Gallegos is a 29 y.o. female  presenting with central chest pain intermittently for the past 2 days described as soreness and squeezing without alleviating or aggravating factors. Patient states it is associated with her migraine. She denies shortness of breath, vomiting, diaphoresis. Pt denies history of DVT, PE, recent surgery or trauma, malignancy, hemoptysis, exogenous estrogen use, unilateral leg swelling or tenderness, immobilization. Patient also with migraine the past couple of days on right side had this type as throbbing without visual changes, slurred speech, weakness. Patient states is like other headaches she has had before. She endorses gradual onset. It resolves with extra strength Tylenol. She denies fevers or chills.   Past Medical History  Diagnosis Date  . Constipation   . Depression     No meds. currently  . Panic attacks     No meds currently;used to take meds in 2009  . Asthma     Exercise induced;inhaler prn  . Infection     Yeast;not frequent  . History of blood transfusion     Accident at 7 yoa and lost a lot of blood  . Preterm labor 2010    2nd child;had to stop labor 2 times;due to stress  . Stress     Per pt tends to worry about small things  . Seizures     if gets overheated;no meds, last seizure 6 yrs ago  . Migraine    Past Surgical History  Procedure Laterality Date  . Foot surgery      R foot, repair torn tissue and skin graft  . Dilation and curettage of uterus      Suction D&C to remove 16 wk. fetal demise  . Wisdom tooth extraction  2008   Family History  Problem Relation Age of Onset  . Anesthesia problems Neg Hx   . Heart disease Maternal  Aunt     Hole in the heart  . Heart disease Maternal Uncle     Hole in the heart  . Heart attack Maternal Grandmother     Deceased  . Hypertension Maternal Uncle   . Hypertension Maternal Grandmother   . Hypertension Paternal Uncle   . Diabetes Paternal Grandmother   . Seizures Daughter     if gets overheated  . Seizures Daughter     if gets overheated  . Seizures Father     if gets overheated  . Bipolar disorder Cousin     maternal  . Depression Cousin     maternal  . Other Daughter     1st child had 2 vein umbilical cord; Pt did weekly NSTs.   History  Substance Use Topics  . Smoking status: Never Smoker   . Smokeless tobacco: Never Used  . Alcohol Use: No     Comment: Occasioanlly prior to pregnancy   OB History    Gravida Para Term Preterm AB TAB SAB Ectopic Multiple Living   5 3 2 1 2 1 1  0 0 3     Review of Systems 10 Systems reviewed and are negative for acute change except as noted in the HPI.    Allergies  Chocolate  Home Medications   Prior to Admission medications  Medication Sig Start Date End Date Taking? Authorizing Provider  ibuprofen (ADVIL,MOTRIN) 600 MG tablet Take 1 tablet (600 mg total) by mouth every 6 (six) hours. Patient not taking: Reported on 01/05/2015 04/21/13   Sanda Klein, CNM  metoCLOPramide (REGLAN) 10 MG tablet Take 1 tablet (10 mg total) by mouth every 6 (six) hours. 01/05/15   Oswaldo Conroy, PA-C  Prenatal Vit-Fe Fumarate-FA (PRENATAL MULTIVITAMIN) TABS Take 1 tablet by mouth daily at 12 noon.    Historical Provider, MD   BP 101/63 mmHg  Pulse 66  Temp(Src) 98.4 F (36.9 C) (Oral)  Resp 18  SpO2 100% Physical Exam  Constitutional: She appears well-developed and well-nourished. No distress.  HENT:  Head: Normocephalic and atraumatic.  Mouth/Throat: Oropharynx is clear and moist.  Eyes: Conjunctivae and EOM are normal. Pupils are equal, round, and reactive to light. Right eye exhibits no discharge. Left eye exhibits  no discharge.  Neck: Normal range of motion. Neck supple. No JVD present.  No nuchal rigidity  Cardiovascular: Normal rate and regular rhythm.   No leg swelling or tenderness. Negative Homan's sign.  Pulmonary/Chest: Effort normal and breath sounds normal. No respiratory distress. She has no wheezes.  Chest wall tenderness diffusely  Abdominal: Soft. Bowel sounds are normal. She exhibits no distension. There is no tenderness.  Neurological: She is alert. No cranial nerve deficit. Coordination normal.  Speech is clear and goal oriented. Peripheral visual fields intact. Strength 5/5 in upper and lower extremities. Sensation intact. Intact rapid alternating movements, finger to nose, and heel to shin. Negative Romberg. No pronator drift. Normal gait.   Skin: Skin is warm and dry. She is not diaphoretic.  Nursing note and vitals reviewed.   ED Course  Procedures (including critical care time) Labs Review Labs Reviewed  BASIC METABOLIC PANEL - Abnormal; Notable for the following:    Creatinine, Ser 1.14 (*)    All other components within normal limits  CBC  I-STAT TROPOININ, ED  Rosezena Sensor, ED    Imaging Review Dg Chest 2 View  01/05/2015   CLINICAL DATA:  Mid region chest pain for 2 weeks  EXAM: CHEST  2 VIEW  COMPARISON:  September 20, 2010  FINDINGS: There is no edema or consolidation. Heart size and pulmonary vascularity are normal. No adenopathy. There is stable mid thoracic dextroscoliosis. No pneumothorax.  IMPRESSION: Scoliosis.  No edema or consolidation.   Electronically Signed   By: Bretta Bang III M.D.   On: 01/05/2015 16:51     EKG Interpretation   Date/Time:  Monday January 05 2015 16:23:52 EDT Ventricular Rate:  85 PR Interval:  107 QRS Duration: 69 QT Interval:  377 QTC Calculation: 448 R Axis:   73 Text Interpretation:  Sinus rhythm Short PR interval since last tracing no  significant change Confirmed by Effie Shy  MD, ELLIOTT [phone]) on 01/05/2015  5:35:40  PM      Meds given in ED:  Medications  sodium chloride 0.9 % bolus 1,000 mL (0 mLs Intravenous Stopped 01/05/15 2051)  ketorolac (TORADOL) 30 MG/ML injection 30 mg (30 mg Intravenous Given 01/05/15 1904)  diphenhydrAMINE (BENADRYL) injection 25 mg (25 mg Intravenous Given 01/05/15 1904)  metoCLOPramide (REGLAN) injection 10 mg (10 mg Intravenous Given 01/05/15 1904)    Discharge Medication List as of 01/05/2015  8:32 PM    START taking these medications   Details  metoCLOPramide (REGLAN) 10 MG tablet Take 1 tablet (10 mg total) by mouth every 6 (six) hours., Starting 01/05/2015, Until Discontinued,  Print          MDM   Final diagnoses:  Nonintractable headache  Chest pain, unspecified chest pain type   Chest pain is not likely of cardiac or pulmonary etiology d/t presentation, PERC negative, low risk HEART score of 2. VSS no JVD or new murmur, RRR, breath sounds equal bilaterally, EKG without acute abnormalities, negative troponin and delta troponin, and negative CXR. Pt also with headache with normal neurological exam. Not sudden in onset or maximal in severity. No worst of life. I doubt SAH, ICH, meningitis. Pt to follow up with PCP in one week as planned. Pt appears reliable for follow up and is agreeable to discharge.   Discussed return precautions with patient. Discussed all results and patient verbalizes understanding and agrees with plan.    Oswaldo Conroy, PA-C 01/06/15 1301  Lorre Nick, MD 01/06/15 [id number]

## 2015-01-05 NOTE — Discharge Instructions (Signed)
Return to the emergency room with worsening of symptoms, new symptoms or with symptoms that are concerning , especially chest pain that feels like a pressure or sharp, spreads to left arm or jaw, worse with exertion, associated with nausea, vomiting, shortness of breath and/or sweating, leg swelling or coughing up blood OR , especially severe worsening of headache, visual or speech changes, weakness in face, arms or legs. Please call your doctor for a followup appointment within 24-48 hours. When you talk to your doctor please let them know that you were seen in the emergency department and have them acquire all of your records so that they can discuss the findings with you and formulate a treatment plan to fully care for your new and ongoing problems.  Read below information and follow recommendations. General Headache Without Cause A headache is pain or discomfort felt around the head or neck area. The specific cause of a headache may not be found. There are many causes and types of headaches. A few common ones are:  Tension headaches.  Migraine headaches.  Cluster headaches.  Chronic daily headaches. HOME CARE INSTRUCTIONS   Keep all follow-up appointments with your caregiver or any specialist referral.  Only take over-the-counter or prescription medicines for pain or discomfort as directed by your caregiver.  Lie down in a dark, quiet room when you have a headache.  Keep a headache journal to find out what may trigger your migraine headaches. For example, write down:  What you eat and drink.  How much sleep you get.  Any change to your diet or medicines.  Try massage or other relaxation techniques.  Put ice packs or heat on the head and neck. Use these 3 to 4 times per day for 15 to 20 minutes each time, or as needed.  Limit stress.  Sit up straight, and do not tense your muscles.  Quit smoking if you smoke.  Limit alcohol use.  Decrease the amount of caffeine you drink,  or stop drinking caffeine.  Eat and sleep on a regular schedule.  Get 7 to 9 hours of sleep, or as recommended by your caregiver.  Keep lights dim if bright lights bother you and make your headaches worse. SEEK MEDICAL CARE IF:   You have problems with the medicines you were prescribed.  Your medicines are not working.  You have a change from the usual headache.  You have nausea or vomiting. SEEK IMMEDIATE MEDICAL CARE IF:   Your headache becomes severe.  You have a fever.  You have a stiff neck.  You have loss of vision.  You have muscular weakness or loss of muscle control.  You start losing your balance or have trouble walking.  You feel faint or pass out.  You have severe symptoms that are different from your first symptoms. MAKE SURE YOU:   Understand these instructions.  Will watch your condition.  Will get help right away if you are not doing well or get worse. Document Released: 06/27/2005 Document Revised: 09/19/2011 Document Reviewed: 07/13/2011 [facility] Patient Information 2015 [place], [place]. This information is not intended to replace advice given to you by your health care provider. Make sure you discuss any questions you have with your health care provider.

## 2015-01-05 NOTE — ED Notes (Signed)
Pt c/o intermittent chest pain x 2 weeks.  Pt states that the pain is squeezing and will come on with out a cause.  Pt also has migraine on right side of head about same amount of time as CP.  Pt states that she has PMH migraines but hasnt had any in about 3 years.

## 2015-01-05 NOTE — ED Notes (Signed)
Pt states she has had mid-sternal chest pain and headache today.  Pt also c/o nausea.  Pt states chest is tender to palpation.  Pt denies other complaints.

## 2015-01-19 ENCOUNTER — Telehealth: Payer: Self-pay | Admitting: Neurology

## 2015-01-19 ENCOUNTER — Ambulatory Visit (INDEPENDENT_AMBULATORY_CARE_PROVIDER_SITE_OTHER): Payer: Medicaid Other | Admitting: Neurology

## 2015-01-19 ENCOUNTER — Encounter: Payer: Self-pay | Admitting: Neurology

## 2015-01-19 VITALS — BP 108/74 | HR 89 | Temp 97.2°F | Ht 68.0 in | Wt 134.8 lb

## 2015-01-19 DIAGNOSIS — M791 Myalgia: Secondary | ICD-10-CM

## 2015-01-19 DIAGNOSIS — R519 Headache, unspecified: Secondary | ICD-10-CM

## 2015-01-19 DIAGNOSIS — H538 Other visual disturbances: Secondary | ICD-10-CM | POA: Diagnosis not present

## 2015-01-19 DIAGNOSIS — R51 Headache: Secondary | ICD-10-CM | POA: Diagnosis not present

## 2015-01-19 DIAGNOSIS — M419 Scoliosis, unspecified: Secondary | ICD-10-CM | POA: Diagnosis not present

## 2015-01-19 DIAGNOSIS — M7918 Myalgia, other site: Secondary | ICD-10-CM

## 2015-01-19 MED ORDER — NORTRIPTYLINE HCL 10 MG PO CAPS: 20.0000 mg | ORAL_CAPSULE | Freq: Every day | ORAL | Status: DC

## 2015-01-19 NOTE — Telephone Encounter (Signed)
Spoke w/ pt and told her Dr. Lucia GaskinsAhern sent Rx for nortriptyline to her pharmacy. She stated when she went to pick it up, they did not have anything. I told her they did receive confirmation at 10am and she had gone before then. She is going to try again. Told her to call back with further questions. Pt verbalized understanding.

## 2015-01-19 NOTE — Telephone Encounter (Signed)
Patient called and stated that Dr. Lucia GaskinsAhern was supposed to write a prescription for a new medication for her head aches. She does not know what medication it was but would like to follow up on it. Please call and advise.
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
## 2015-01-19 NOTE — Patient Instructions (Addendum)
Overall you are doing well but I do want to suggest a few things today:   Remember to drink plenty of fluid, eat healthy meals and do not skip any meals. Try to eat protein with a every meal and eat a healthy snack such as fruit or nuts in between meals. Try to keep a regular sleep-wake schedule and try to exercise daily, particularly in the form of walking, 20-30 minutes a day, if you can.   As far as your medications are concerned, I would like to suggest: Nortriptyline 10mg  at night 30 minutes before bed. Start with 10mg (1 tab) and can increase to 2 tabs in a week. If sexually active, use birth control Physical Therapy for back pain  As far as diagnostic testing: MRI of the brain  I would like to see you back in 3 months, sooner if we need to. Please call us with any interim questions, concerns, problems, updates or refill requests.   Please also call us for any test results so we can go over those with you on the phone.  My clinical assistant and will answer any of your questions and relay your messages to me and also relay most of my messages to you.   Our phone number is [phone]. We also have an after hours call service for urgent matters and there is a physician on-call for urgent questions. For any emergencies you know to call 911 or go to the nearest emergency room  To prevent or relieve headaches, try the following: Cool Compress. Lie down and place a cool compress on your head.  Avoid headache triggers. If certain foods or odors seem to have triggered your migraines in the past, avoid them. A headache diary might help you identify triggers.  Include physical activity in your daily routine. Try a daily walk or other moderate aerobic exercise.  Manage stress. Find healthy ways to cope with the stressors, such as delegating tasks on your to-do list.  Practice relaxation techniques. Try deep breathing, yoga, massage and visualization.  Eat regularly. Eating regularly scheduled  meals and maintaining a healthy diet might help prevent headaches. Also, drink plenty of fluids.  Follow a regular sleep schedule. Sleep deprivation might contribute to headaches Consider biofeedback. With this mind-body technique, you learn to control certain bodily functions - such as muscle tension, heart rate and blood pressure - to prevent headaches or reduce headache pain.    Proceed to emergency room if you experience new or worsening symptoms or symptoms do not resolve, if you have new neurologic symptoms or if headache is severe, or for any concerning symptom.

## 2015-01-21 ENCOUNTER — Other Ambulatory Visit: Payer: Self-pay | Admitting: Neurology

## 2015-01-24 ENCOUNTER — Ambulatory Visit
Admission: RE | Admit: 2015-01-24 | Discharge: 2015-01-24 | Disposition: A | Discharge status: Home / Self Care | Payer: Medicaid Other | Source: Ambulatory Visit | Attending: Neurology | Admitting: Neurology

## 2015-01-24 DIAGNOSIS — H538 Other visual disturbances: Secondary | ICD-10-CM

## 2015-01-24 DIAGNOSIS — R519 Headache, unspecified: Secondary | ICD-10-CM

## 2015-01-24 DIAGNOSIS — R51 Headache: Principal | ICD-10-CM

## 2015-01-27 ENCOUNTER — Telehealth: Payer: Self-pay | Admitting: *Deleted

## 2015-01-27 NOTE — Telephone Encounter (Signed)
Left message on voicemail letting her know test results are normal.  Left our call back number for any questions.

## 2015-01-27 NOTE — Telephone Encounter (Signed)
-----   Message from Anson FretAntonia B Ahern, MD sent at 01/26/2015  5:11 PM EDT ----- Please let patient know the MRI of her brain was normal, thanks

## 2015-03-19 ENCOUNTER — Ambulatory Visit: Payer: Medicaid Other | Attending: Neurology | Admitting: Rehabilitation

## 2015-03-19 DIAGNOSIS — M4120 Other idiopathic scoliosis, site unspecified: Secondary | ICD-10-CM | POA: Insufficient documentation

## 2015-03-19 DIAGNOSIS — M545 Low back pain: Secondary | ICD-10-CM | POA: Insufficient documentation

## 2015-03-19 DIAGNOSIS — M791 Myalgia: Secondary | ICD-10-CM | POA: Insufficient documentation

## 2015-03-20 ENCOUNTER — Ambulatory Visit: Payer: Medicaid Other | Admitting: Rehabilitation

## 2015-03-20 ENCOUNTER — Encounter: Payer: Self-pay | Admitting: Rehabilitation

## 2015-03-20 DIAGNOSIS — M4120 Other idiopathic scoliosis, site unspecified: Secondary | ICD-10-CM

## 2015-03-20 DIAGNOSIS — M545 Low back pain, unspecified: Secondary | ICD-10-CM

## 2015-03-20 DIAGNOSIS — M7918 Myalgia, other site: Secondary | ICD-10-CM

## 2015-03-20 DIAGNOSIS — M791 Myalgia: Secondary | ICD-10-CM | POA: Diagnosis present
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
## 2015-03-20 NOTE — Patient Instructions (Addendum)
Side Stretch    Do the above stretch in sitting with pillows or wedge stacked to your R, lean over pillow and stretch L arm over head, hold for 60 secs.  Do this 2x per set and 2 x per day.  Increase time to 1 min and a half to 2 mins as tolerated.    Back Injury Prevention  The following tips can help you to prevent a back injury. PHYSICAL FITNESS  Exercise often. Try to develop strong stomach (abdominal) muscles.  Do aerobic exercises often. This includes walking, jogging, biking, swimming.  Do exercises that help with balance and strength often. This includes tai chi and yoga.  Stretch before and after you exercise.  Keep a healthy weight. DIET   Ask your doctor how much calcium and vitamin D you need every day.  Include calcium in your diet. Foods high in calcium include dairy products; green, leafy vegetables; and products with calcium added (fortified).  Include vitamin D in your diet. Foods high in vitamin D include milk and products with vitamin D added.  Think about taking a multivitamin or other nutritional products called " supplements."  Stop smoking if you smoke. POSTURE   Sit and stand up straight. Avoid leaning forward or hunching over.  Choose chairs that support your lower back.  If you work at a desk:  Sit close to your work so you do not lean over.  Keep your chin tucked in.  Keep your neck drawn back.  Keep your elbows bent at a right angle. Your arms should look like the letter "L."  Sit high and close to the steering wheel when you drive. Add low back support to your car seat if needed.  Avoid sitting or standing in one position for too long. Get up and move around every hour. Take breaks if you are driving for a long time.  Sleep on your side with your knees slightly bent. You can also sleep on your back with a pillow under your knees. Do not sleep on your stomach. LIFTING, TWISTING, AND REACHING  Avoid heavy lifting, especially lifting  over and over again. If you must do heavy lifting:  Stretch before lifting.  Work slowly.  Rest between lifts.  Use carts and dollies to move objects when possible.  Make several small trips instead of carrying 1 heavy load.  Ask for help when you need it.  Ask for help when moving big, awkward objects.  Follow these steps when lifting:  Stand with your feet shoulder-width apart.  Get as close to the object as you can. Do not pick up heavy objects that are far from your body.  Use handles or lifting straps when possible.  Bend at your knees. Squat down, but keep your heels off the floor.  Keep your shoulders back, your chin tucked in, and your back straight.  Lift the object slowly. Tighten the muscles in your legs, stomach, and butt. Keep the object as close to the center of your body as possible.  Reverse these directions when you put a load down.  Do not:  Lift the object above your waist.  Twist at the waist while lifting or carrying a load. Move your feet if you need to turn, not your waist.  Bend over without bending at your knees.  Avoid reaching over your head, across a table, or for an object on a high surface. OTHER TIPS  Avoid wet floors and keep sidewalks clear of ice. Do not  sleep on a mattress that is too soft or too hard.Pelvic Tilt: Posterior - Legs Bent (Supine)  Keep items that you use often within easy reach.  Put heavier objects on shelves at waist level. Put lighter objects on lower or higher shelves.  Find ways to lessen your stress. You can try exercise, massage, or relaxation.  Get help for depression or anxiety if needed. GET HELP IF:  You injure your back.  You have questions about diet, exercise, or other ways to prevent back injuries. MAKE SURE YOU:  Understand these instructions.  Will watch your condition.  Will get help right away if you are not doing well or get worse. Document Released: 12/14/2007 Document Revised:  09/19/2011 Document Reviewed: 08/08/2011 [facility] Patient Information 2015 [place], [place]. This information is not intended to replace advice given to you by your health care provider. Make sure you discuss any questions you have with your health care provider. Abduction: Clam (Eccentric) - Side-Lying _  Lie on side with knees bent. Lift top knee, keeping feet together. Keep trunk steady. Slowly lower for 3-5 seconds. _10__ reps per set, _2__ sets per day, __2_ days per week.   Copyright  VHI. All rights reserved.  Bracing With Arm / Leg Raise (Quadruped)   On hands and knees find neutral spine. Tighten pelvic floor and abdominals and hold. Alternating, lift arm to shoulder level and opposite leg to hip level. Repeat _10__ times. Do _2_ times a day.  Pelvic Tilt: Posterior - Legs Bent (Supine)   Tighten stomach and flatten back by rolling pelvis down. Hold __5__ seconds. Relax. Repeat __10__ times per set. Do _2___ sets per session. Do _2___ sessions per day.  Add marching while keeping pelvic down R then L then relax, repeat 10x, do 2x per set and 2 sets per session.   http://orth.exer.us/203   Copyright  VHI. All rights reserved.   Abduction: Clam (Eccentric) - Side-Lying   Lie on side with knees bent. Lift top knee, keeping feet together. Keep trunk steady. Slowly lower for 3-5 seconds. _10__ reps per set, __2_ sets per day, __5-7  days per week.  Copyright  VHI. All rights reserved.

## 2015-04-21 ENCOUNTER — Ambulatory Visit (INDEPENDENT_AMBULATORY_CARE_PROVIDER_SITE_OTHER): Payer: Self-pay | Admitting: Neurology

## 2015-04-21 ENCOUNTER — Encounter: Payer: Self-pay | Admitting: Neurology

## 2015-04-21 VITALS — BP 115/71 | HR 88 | Ht 68.0 in | Wt 138.4 lb

## 2015-04-21 DIAGNOSIS — G44229 Chronic tension-type headache, not intractable: Secondary | ICD-10-CM

## 2015-04-21 DIAGNOSIS — G44209 Tension-type headache, unspecified, not intractable: Secondary | ICD-10-CM

## 2015-04-21 DIAGNOSIS — G43909 Migraine, unspecified, not intractable, without status migrainosus: Secondary | ICD-10-CM | POA: Insufficient documentation

## 2015-04-21 DIAGNOSIS — G43009 Migraine without aura, not intractable, without status migrainosus: Secondary | ICD-10-CM

## 2015-04-21 MED ORDER — NORTRIPTYLINE HCL 10 MG PO CAPS: 30.0000 mg | ORAL_CAPSULE | Freq: Every day | ORAL | Status: DC
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
## 2015-04-21 NOTE — Patient Instructions (Signed)
Remember to drink plenty of fluid, eat healthy meals and do not skip any meals. Try to eat protein with a every meal and eat a healthy snack such as fruit or nuts in between meals. Try to keep a regular sleep-wake schedule and try to exercise daily, particularly in the form of walking, 20-30 minutes a day, if you can.   As far as your medications are concerned, I would like to suggest: Increase the Nortriptyline to  at night. Then in 2-3 weeks can go to  an hour before bed. Stop for any side effects.  I would like to see you back in 3 months, sooner if we need to. Please call us with any interim questions, concerns, problems, updates or refill requests.   Please also call us for any test results so we can go over those with you on the phone.  My clinical assistant and will answer any of your questions and relay your messages to me and also relay most of my messages to you.   Our phone number is [phone]. We also have an after hours call service for urgent matters and there is a physician on-call for urgent questions. For any emergencies you know to call 911 or go to the nearest emergency room

## 2015-07-12 NOTE — L&D Delivery Note (Signed)
Delivery Note At 1:26 AM a viable female was delivered via Vaginal, Spontaneous Delivery (Presentation:ROA ; Vertex  ).  APGAR: 8, 9; weight pending  .   Placenta status: delivered intact with gentle traction.  Cord: 3 vessel with the following complications: none .  Cord pH: not collected  Anesthesia:  none Episiotomy: None Lacerations: None Est. Blood Loss (mL): 100  Mom to postpartum.  Baby to Couplet care / Skin to Skin.  [name] 07/02/2016, 1:48 AM

## 2015-07-27 ENCOUNTER — Telehealth: Payer: Self-pay | Admitting: *Deleted

## 2015-07-27 ENCOUNTER — Ambulatory Visit (INDEPENDENT_AMBULATORY_CARE_PROVIDER_SITE_OTHER): Payer: Self-pay | Admitting: Neurology

## 2015-07-27 DIAGNOSIS — G43809 Other migraine, not intractable, without status migrainosus: Secondary | ICD-10-CM

## 2015-07-27 NOTE — Telephone Encounter (Signed)
no showed f/u appt 

## 2015-07-27 NOTE — Progress Notes (Signed)
No show

## 2015-07-28 ENCOUNTER — Encounter: Payer: Self-pay | Admitting: Neurology

## 2015-08-12 ENCOUNTER — Encounter (HOSPITAL_COMMUNITY): Payer: Self-pay

## 2015-08-12 ENCOUNTER — Emergency Department (HOSPITAL_COMMUNITY)
Admission: EM | Admit: 2015-08-12 | Discharge: 2015-08-12 | Disposition: A | Discharge status: Home / Self Care | Payer: Medicaid Other | Attending: Emergency Medicine | Admitting: Emergency Medicine

## 2015-08-12 ENCOUNTER — Emergency Department (HOSPITAL_COMMUNITY): Payer: Medicaid Other

## 2015-08-12 DIAGNOSIS — R0789 Other chest pain: Secondary | ICD-10-CM | POA: Insufficient documentation

## 2015-08-12 DIAGNOSIS — Z8751 Personal history of pre-term labor: Secondary | ICD-10-CM | POA: Insufficient documentation

## 2015-08-12 DIAGNOSIS — Z79899 Other long term (current) drug therapy: Secondary | ICD-10-CM | POA: Insufficient documentation

## 2015-08-12 DIAGNOSIS — J45909 Unspecified asthma, uncomplicated: Secondary | ICD-10-CM | POA: Insufficient documentation

## 2015-08-12 DIAGNOSIS — F329 Major depressive disorder, single episode, unspecified: Secondary | ICD-10-CM | POA: Insufficient documentation

## 2015-08-12 DIAGNOSIS — M791 Myalgia: Secondary | ICD-10-CM | POA: Insufficient documentation

## 2015-08-12 DIAGNOSIS — M419 Scoliosis, unspecified: Secondary | ICD-10-CM | POA: Insufficient documentation

## 2015-08-12 DIAGNOSIS — Z3202 Encounter for pregnancy test, result negative: Secondary | ICD-10-CM | POA: Insufficient documentation

## 2015-08-12 DIAGNOSIS — M7918 Myalgia, other site: Secondary | ICD-10-CM

## 2015-08-12 DIAGNOSIS — Z8719 Personal history of other diseases of the digestive system: Secondary | ICD-10-CM | POA: Insufficient documentation

## 2015-08-12 DIAGNOSIS — G43909 Migraine, unspecified, not intractable, without status migrainosus: Secondary | ICD-10-CM | POA: Insufficient documentation

## 2015-08-12 DIAGNOSIS — Z8619 Personal history of other infectious and parasitic diseases: Secondary | ICD-10-CM | POA: Insufficient documentation

## 2015-08-12 DIAGNOSIS — R42 Dizziness and giddiness: Secondary | ICD-10-CM | POA: Insufficient documentation

## 2015-08-12 LAB — I-STAT TROPONIN, ED: TROPONIN I, POC: 0 ng/mL (ref 0.00–0.08)

## 2015-08-12 LAB — BASIC METABOLIC PANEL
Anion gap: 8 (ref 5–15)
BUN: 11 mg/dL (ref 6–20)
CALCIUM: 9.5 mg/dL (ref 8.9–10.3)
CO2: 26 mmol/L (ref 22–32)
CREATININE: 0.93 mg/dL (ref 0.44–1.00)
Chloride: 105 mmol/L (ref 101–111)
GFR calc Af Amer: >60 mL/min (ref >60)
GFR calc non Af Amer: >60 mL/min (ref >60)
Glucose, Bld: 87 mg/dL (ref 65–99)
Potassium: 4.1 mmol/L (ref 3.5–5.1)
Sodium: 139 mmol/L (ref 135–145)

## 2015-08-12 LAB — CBG MONITORING, ED: Glucose-Capillary: 80 mg/dL (ref 65–99)

## 2015-08-12 LAB — I-STAT BETA HCG BLOOD, ED (MC, WL, AP ONLY)

## 2015-08-12 LAB — CBC
HCT: 44.1 % (ref 36.0–46.0)
Hemoglobin: 14.8 g/dL (ref 12.0–15.0)
MCH: 30.8 pg (ref 26.0–34.0)
MCHC: 33.6 g/dL (ref 30.0–36.0)
MCV: 91.7 fL (ref 78.0–100.0)
PLATELETS: 243 10*3/uL (ref 150–400)
RBC: 4.81 MIL/uL (ref 3.87–5.11)
RDW: 12.6 % (ref 11.5–15.5)
WBC: 4 10*3/uL (ref 4.0–10.5)

## 2015-08-12 NOTE — ED Notes (Signed)
MD at bedside. 

## 2015-08-12 NOTE — ED Provider Notes (Signed)
CSN: [id number]     Arrival date & time 08/12/15  1005 History   First MD Initiated Contact with Patient 08/12/15 1123     Chief Complaint  Patient presents with  . Dizziness  . Chest Pain  . Back Pain     (Consider location/radiation/quality/duration/timing/severity/associated sxs/prior Treatment) HPI   She complains of occasional dizziness and blurred vision for one week. She has also noticed some pain in her right upper back for several days. She denies fever, chills, nausea, vomiting, paresthesias or localized weakness or changes in bowel and urinary habits. There are no other known modifying factors.  Past Medical History  Diagnosis Date  . Constipation   . Depression     No meds. currently  . Panic attacks     No meds currently;used to take meds in 2009  . Asthma     Exercise induced;inhaler prn  . Infection     Yeast;not frequent  . History of blood transfusion     Accident at 7 yoa and lost a lot of blood  . Preterm labor 2010    2nd child;had to stop labor 2 times;due to stress  . Stress     Per pt tends to worry about small things  . Seizures (HCC)     if gets overheated;no meds, last seizure 6 yrs ago  . Migraine    Past Surgical History  Procedure Laterality Date  . Foot surgery      R foot, repair torn tissue and skin graft  . Dilation and curettage of uterus      Suction D&C to remove 16 wk. fetal demise  . Wisdom tooth extraction  2008   Family History  Problem Relation Age of Onset  . Anesthesia problems Neg Hx   . Heart disease Maternal Aunt     Hole in the heart  . Heart disease Maternal Uncle     Hole in the heart  . Heart attack Maternal Grandmother     Deceased  . Hypertension Maternal Uncle   . Hypertension Maternal Grandmother   . Hypertension Paternal Uncle   . Diabetes Paternal Grandmother   . Seizures Daughter     if gets overheated  . Seizures Daughter     if gets overheated  . Seizures Father     if gets overheated  . Bipolar  disorder Cousin     maternal  . Depression Cousin     maternal  . Other Daughter     1st child had 2 vein umbilical cord; Pt did weekly NSTs.   Social History  Substance Use Topics  . Smoking status: Never Smoker   . Smokeless tobacco: Never Used  . Alcohol Use: No     Comment: Occasioanlly prior to pregnancy   OB History    Gravida Para Term Preterm AB TAB SAB Ectopic Multiple Living   5 3 2 1 2 1 1  0 0 3     Review of Systems  All other systems reviewed and are negative.     Allergies  Chocolate  Home Medications   Prior to Admission medications   Medication Sig Start Date End Date Taking? Authorizing Provider  HYDROcodone-acetaminophen (NORCO/VICODIN) 5-325 MG per tablet Take 1 tablet by mouth every 6 (six) hours as needed for moderate pain.    Historical Provider, MD  ibuprofen (ADVIL,MOTRIN) 600 MG tablet Take 1 tablet (600 mg total) by mouth every 6 (six) hours. 04/21/13   Sanda Klein, CNM  metoCLOPramide (REGLAN) 10  MG tablet Take 1 tablet (10 mg total) by mouth every 6 (six) hours. 01/05/15   Oswaldo Conroy, PA-C  nortriptyline (PAMELOR) 10 MG capsule Take 3 capsules (30 mg total) by mouth at bedtime. Start with (1 tab at night). Can increase to 2 tabs after 1 week. 04/21/15   Anson Fret, MD  Prenatal Vit-Fe Fumarate-FA (PRENATAL MULTIVITAMIN) TABS Take 1 tablet by mouth daily at 12 noon.    Historical Provider, MD   BP 117/79 mmHg  Pulse 69  Temp(Src) 97.7 F (36.5 C) (Oral)  Resp 16  SpO2 100%  LMP 08/02/2015 Physical Exam  Constitutional: She is oriented to person, place, and time. She appears well-developed and well-nourished.  HENT:  Head: Normocephalic and atraumatic.  Right Ear: External ear normal.  Left Ear: External ear normal.  Eyes: Conjunctivae and EOM are normal. Pupils are equal, round, and reactive to light.  Neck: Normal range of motion and phonation normal. Neck supple.  Cardiovascular: Normal rate, regular rhythm and normal  heart sounds.   Pulmonary/Chest: Effort normal and breath sounds normal. She exhibits no bony tenderness.  Abdominal: Soft. There is no tenderness.  Musculoskeletal: Normal range of motion.  Mild thoracic scoliosis, convex right. Tender right posterior upper chest wall. With mild spasm at this site.  Neurological: She is alert and oriented to person, place, and time. No cranial nerve deficit or sensory deficit. She exhibits normal muscle tone. Coordination normal.  No dysarthria and aphasia or nystagmus  Skin: Skin is warm, dry and intact.  Psychiatric: She has a normal mood and affect. Her behavior is normal. Judgment and thought content normal.  Nursing note and vitals reviewed.   ED Course  Procedures (including critical care time)  Medications - No data to display  Patient Vitals for the past 24 hrs:  BP Temp Temp src Pulse Resp SpO2  08/12/15 1016 117/79 mmHg 97.7 F (36.5 C) Oral 69 16 100 %    12:29 PM Reevaluation with update and discussion. After initial assessment and treatment, an updated evaluation reveals no change in clinical status. Findings discussed with patient, all questions were answered. [place] [facility]    Labs Review Labs Reviewed  BASIC METABOLIC PANEL  CBC  URINALYSIS, ROUTINE W REFLEX MICROSCOPIC (NOT AT [facility])  I-STAT TROPOININ, ED  CBG MONITORING, ED  I-STAT BETA HCG BLOOD, ED ([facility], [facility], [facility] ONLY)    Imaging Review Dg Chest 2 View  08/12/2015  CLINICAL DATA:  Mild chest pain for weeks. EXAM: CHEST  2 VIEW COMPARISON:  01/05/2015 FINDINGS: Cardiomediastinal silhouette is normal. Mediastinal contours appear intact. There is no evidence of focal airspace consolidation, pleural effusion or pneumothorax. Osseous structures are without acute abnormality. There is mild dextro convex thoracic scoliosis centered at T10 vertebral body. Soft tissues are grossly normal. IMPRESSION: No active cardiopulmonary disease. Mild dextro convex thoracic scoliosis. Electronically  Signed   By: Ted Mcalpine M.D.   On: 08/12/2015 11:13   I have personally reviewed and evaluated these images and lab results as part of my medical decision-making.   EKG Interpretation   Date/Time:  Wednesday August 12 2015 10:27:46 EST Ventricular Rate:  76 PR Interval:  114 QRS Duration: 64 QT Interval:  395 QTC Calculation: 444 R Axis:   62 Text Interpretation:  Sinus rhythm Borderline short PR interval Abnormal Q  suggests anterior infarct since last tracing no significant change  Confirmed by Effie Shy  MD, Kazaria Gaertner ([id number]) on 08/12/2015 11:24:39 AM      MDM  Final diagnoses:  Musculoskeletal pain    Nonspecific back and chest pain, likely related to her scoliosis. Blurred vision is nonspecific finding. Has been told that she may have diabetes in the past, but blood sugar and metabolic screen is normal. Doubt pneumonia, ACS, PE, or serious bacterial infection.   Nursing Notes Reviewed/ Care Coordinated Applicable Imaging Reviewed Interpretation of Laboratory Data incorporated into ED treatment  The patient appears reasonably screened and/or stabilized for discharge and I doubt any other medical condition or other [facility] requiring further screening, evaluation, or treatment in the ED at this time prior to discharge.  Plan: Home Medications- OTC analgesia; Home Treatments- rest, heat; return here if the recommended treatment, does not improve the symptoms; Recommended follow up- PCP prn   Mancel Bale, MD 08/12/15 1230

## 2015-08-12 NOTE — Discharge Instructions (Signed)
Use heat on the sore area 3 or 4 times a day. For pain, use Tylenol or Advil. Have your blurred vision checked by an eye doctor in one or 2 weeks.  Musculoskeletal Pain Musculoskeletal pain is muscle and boney aches and pains. These pains can occur in any part of the body. Your caregiver may treat you without knowing the cause of the pain. They may treat you if blood or urine tests, X-rays, and other tests were normal.  CAUSES There is often not a definite cause or reason for these pains. These pains may be caused by a type of germ (virus). The discomfort may also come from overuse. Overuse includes working out too hard when your body is not fit. Boney aches also come from weather changes. Bone is sensitive to atmospheric pressure changes. HOME CARE INSTRUCTIONS   Ask when your test results will be ready. Make sure you get your test results.  Only take over-the-counter or prescription medicines for pain, discomfort, or fever as directed by your caregiver. If you were given medications for your condition, do not drive, operate machinery or power tools, or sign legal documents for 24 hours. Do not drink alcohol. Do not take sleeping pills or other medications that may interfere with treatment.  Continue all activities unless the activities cause more pain. When the pain lessens, slowly resume normal activities. Gradually increase the intensity and duration of the activities or exercise.  During periods of severe pain, bed rest may be helpful. Lay or sit in any position that is comfortable.  Putting ice on the injured area.  Put ice in a bag.  Place a towel between your skin and the bag.  Leave the ice on for 15 to 20 minutes, 3 to 4 times a day.  Follow up with your caregiver for continued problems and no reason can be found for the pain. If the pain becomes worse or does not go away, it may be necessary to repeat tests or do additional testing. Your caregiver may need to look further for a  possible cause. SEEK IMMEDIATE MEDICAL CARE IF:  You have pain that is getting worse and is not relieved by medications.  You develop chest pain that is associated with shortness or breath, sweating, feeling sick to your stomach (nauseous), or throw up (vomit).  Your pain becomes localized to the abdomen.  You develop any new symptoms that seem different or that concern you. MAKE SURE YOU:   Understand these instructions.  Will watch your condition.  Will get help right away if you are not doing well or get worse.   This information is not intended to replace advice given to you by your health care provider. Make sure you discuss any questions you have with your health care provider.   Document Released: 06/27/2005 Document Revised: 09/19/2011 Document Reviewed: 03/01/2013 Elsevier Interactive Patient Education [employer]

## 2015-08-12 NOTE — ED Notes (Signed)
Pt c/o c/o intermittent dizziness, blurred vision, central chest pain, and back pain x 1 week.  Pain score 7/10.  Hx DM, migraines, and scoliosis.

## 2015-08-24 ENCOUNTER — Emergency Department (HOSPITAL_COMMUNITY)
Admission: EM | Admit: 2015-08-24 | Discharge: 2015-08-24 | Disposition: A | Discharge status: Home / Self Care | Payer: Medicaid Other | Attending: Emergency Medicine | Admitting: Emergency Medicine

## 2015-08-24 ENCOUNTER — Encounter (HOSPITAL_COMMUNITY): Payer: Self-pay

## 2015-08-24 ENCOUNTER — Emergency Department (HOSPITAL_COMMUNITY): Payer: Medicaid Other

## 2015-08-24 DIAGNOSIS — X58XXXA Exposure to other specified factors, initial encounter: Secondary | ICD-10-CM | POA: Insufficient documentation

## 2015-08-24 DIAGNOSIS — J45909 Unspecified asthma, uncomplicated: Secondary | ICD-10-CM | POA: Insufficient documentation

## 2015-08-24 DIAGNOSIS — Y9389 Activity, other specified: Secondary | ICD-10-CM | POA: Insufficient documentation

## 2015-08-24 DIAGNOSIS — Z8619 Personal history of other infectious and parasitic diseases: Secondary | ICD-10-CM | POA: Insufficient documentation

## 2015-08-24 DIAGNOSIS — Z8751 Personal history of pre-term labor: Secondary | ICD-10-CM | POA: Insufficient documentation

## 2015-08-24 DIAGNOSIS — R0789 Other chest pain: Secondary | ICD-10-CM

## 2015-08-24 DIAGNOSIS — Z8679 Personal history of other diseases of the circulatory system: Secondary | ICD-10-CM | POA: Insufficient documentation

## 2015-08-24 DIAGNOSIS — S29001A Unspecified injury of muscle and tendon of front wall of thorax, initial encounter: Secondary | ICD-10-CM | POA: Insufficient documentation

## 2015-08-24 DIAGNOSIS — Z8659 Personal history of other mental and behavioral disorders: Secondary | ICD-10-CM | POA: Insufficient documentation

## 2015-08-24 DIAGNOSIS — Y998 Other external cause status: Secondary | ICD-10-CM | POA: Insufficient documentation

## 2015-08-24 DIAGNOSIS — Z79899 Other long term (current) drug therapy: Secondary | ICD-10-CM | POA: Insufficient documentation

## 2015-08-24 DIAGNOSIS — Y9289 Other specified places as the place of occurrence of the external cause: Secondary | ICD-10-CM | POA: Insufficient documentation

## 2015-08-24 DIAGNOSIS — Z7982 Long term (current) use of aspirin: Secondary | ICD-10-CM | POA: Insufficient documentation

## 2015-08-24 LAB — CBC
HEMATOCRIT: 42 % (ref 36.0–46.0)
HEMOGLOBIN: 14.3 g/dL (ref 12.0–15.0)
MCH: 31 pg (ref 26.0–34.0)
MCHC: 34 g/dL (ref 30.0–36.0)
MCV: 91.1 fL (ref 78.0–100.0)
PLATELETS: 234 10*3/uL (ref 150–400)
RBC: 4.61 MIL/uL (ref 3.87–5.11)
RDW: 12.6 % (ref 11.5–15.5)
WBC: 5.3 10*3/uL (ref 4.0–10.5)

## 2015-08-24 LAB — I-STAT TROPONIN, ED: Troponin i, poc: 0 ng/mL (ref 0.00–0.08)

## 2015-08-24 LAB — BASIC METABOLIC PANEL
Anion gap: 12 (ref 5–15)
BUN: 7 mg/dL (ref 6–20)
CHLORIDE: 104 mmol/L (ref 101–111)
CO2: 24 mmol/L (ref 22–32)
CREATININE: 0.84 mg/dL (ref 0.44–1.00)
Calcium: 9.4 mg/dL (ref 8.9–10.3)
GFR calc non Af Amer: >60 mL/min (ref >60)
Glucose, Bld: 87 mg/dL (ref 65–99)
POTASSIUM: 4 mmol/L (ref 3.5–5.1)
Sodium: 140 mmol/L (ref 135–145)

## 2015-08-24 MED ORDER — ACETAMINOPHEN 500 MG PO TABS
1000.0000 mg | ORAL_TABLET | Freq: Once | ORAL | Status: AC
  Administered 2015-08-24: 1000 mg via ORAL
  Filled 2015-08-24: qty 2

## 2015-08-24 NOTE — ED Notes (Signed)
Pt brought in EMS for central chest pain.  Pt reports she was stretching when she felt a pop in her midsternal chest with a sudden onset of chest pressure.  Pt reports she became diaphoretic and nauseous during the episode as well.  Pt given  ASA and  Zofran PTA.  Pt in NAD and A&Ox4

## 2015-08-24 NOTE — Discharge Instructions (Signed)
Take Tylenol and ibuprofen as needed for pain. If you were given medicines take as directed.  If you are on coumadin or contraceptives realize their levels and effectiveness is altered by many different medicines.  If you have any reaction (rash, tongues swelling, other) to the medicines stop taking and see a physician.    If your blood pressure was elevated in the ER make sure you follow up for management with a primary doctor or return for chest pain, shortness of breath or stroke symptoms.  Please follow up as directed and return to the ER or see a physician for new or worsening symptoms.  Thank you. Filed Vitals:   08/24/15 1015 08/24/15 1020  BP:  109/72  Pulse:  75  Temp:  98.1 F (36.7 C)  TempSrc:  Oral  Resp:  15  Height:   (1.727 m)  Weight:  140 lb (63.504 kg)  SpO2: 100% 98%

## 2015-08-24 NOTE — ED Provider Notes (Signed)
CSN: [id number]     Arrival date & time 08/24/15  1015 History   First MD Initiated Contact with Patient 08/24/15 1023     Chief Complaint  Patient presents with  . Chest Pain     (Consider location/radiation/quality/duration/timing/severity/associated sxs/prior Treatment) HPI Comments: 30 year old female with history of anxiety, seizures presents with lower anterior chest discomfort. Patient is brought in by EMS she was stretching and felt a pop on her anterior rib cage region. Pressure and tightness with nausea followed. No history of cardiac problems no significant family history. Patient has mild asthma controlled at home. Currently pain with anything including range of motion palpation. Patient denies classic pulmonary embolism risk factors.  Patient is a 30 y.o. female presenting with chest pain. The history is provided by the patient.  Chest Pain Associated symptoms: no abdominal pain, no back pain, no fever, no headache, no shortness of breath and not vomiting     Past Medical History  Diagnosis Date  . Constipation   . Depression     No meds. currently  . Panic attacks     No meds currently;used to take meds in 2009  . Asthma     Exercise induced;inhaler prn  . Infection     Yeast;not frequent  . History of blood transfusion     Accident at 7 yoa and lost a lot of blood  . Preterm labor 2010    2nd child;had to stop labor 2 times;due to stress  . Stress     Per pt tends to worry about small things  . Seizures (HCC)     if gets overheated;no meds, last seizure 6 yrs ago  . Migraine    Past Surgical History  Procedure Laterality Date  . Foot surgery      R foot, repair torn tissue and skin graft  . Dilation and curettage of uterus      Suction D&C to remove 16 wk. fetal demise  . Wisdom tooth extraction  2008   Family History  Problem Relation Age of Onset  . Anesthesia problems Neg Hx   . Heart disease Maternal Aunt     Hole in the heart  . Heart disease  Maternal Uncle     Hole in the heart  . Heart attack Maternal Grandmother     Deceased  . Hypertension Maternal Uncle   . Hypertension Maternal Grandmother   . Hypertension Paternal Uncle   . Diabetes Paternal Grandmother   . Seizures Daughter     if gets overheated  . Seizures Daughter     if gets overheated  . Seizures Father     if gets overheated  . Bipolar disorder Cousin     maternal  . Depression Cousin     maternal  . Other Daughter     1st child had 2 vein umbilical cord; Pt did weekly NSTs.   Social History  Substance Use Topics  . Smoking status: Never Smoker   . Smokeless tobacco: Never Used  . Alcohol Use: No     Comment: Occasioanlly prior to pregnancy   OB History    Gravida Para Term Preterm AB TAB SAB Ectopic Multiple Living   0 0 3     Review of Systems  Constitutional: Negative for fever and chills.  HENT: Negative for congestion.   Eyes: Negative for visual disturbance.  Respiratory: Negative for shortness of breath.   Cardiovascular: Positive for chest pain. Negative  for leg swelling.  Gastrointestinal: Negative for vomiting and abdominal pain.  Genitourinary: Negative for dysuria and flank pain.  Musculoskeletal: Negative for back pain, neck pain and neck stiffness.  Skin: Negative for rash.  Neurological: Negative for light-headedness and headaches.      Allergies  Chocolate  Home Medications   Prior to Admission medications   Medication Sig Start Date End Date Taking? Authorizing Provider  acetaminophen (TYLENOL) 650 MG CR tablet Take 650 mg by mouth every 8 (eight) hours as needed for pain.    Historical Provider, MD  albuterol (PROVENTIL HFA;VENTOLIN HFA) 108 (90 Base) MCG/ACT inhaler Inhale 2 puffs into the lungs every 6 (six) hours as needed for wheezing or shortness of breath.    Historical Provider, MD  aspirin 325 MG EC tablet Take 325 mg by mouth daily.    Historical Provider, MD  ibuprofen (ADVIL,MOTRIN) 600 MG  tablet Take 1 tablet (600 mg total) by mouth every 6 (six) hours. Patient not taking: Reported on 08/12/2015 04/21/13   Sanda Klein, CNM  metoCLOPramide (REGLAN) 10 MG tablet Take 1 tablet (10 mg total) by mouth every 6 (six) hours. Patient not taking: Reported on 08/12/2015 01/05/15   Oswaldo Conroy, PA-C  nortriptyline (PAMELOR) 10 MG capsule Take 3 capsules (30 mg total) by mouth at bedtime. Start with 10mg (1 tab at night). Can increase to 2 tabs after 1 week. Patient not taking: Reported on 08/12/2015 04/21/15   Anson Fret, MD   BP 109/72 mmHg  Pulse 75  Temp(Src) 98.1 F (36.7 C) (Oral)  Resp 15  Ht 5\' 8"  (1.727 m)  Wt 140 lb (63.504 kg)  BMI 21.29 kg/m2  SpO2 98%  LMP 08/02/2015 Physical Exam  Constitutional: She is oriented to person, place, and time. She appears well-developed and well-nourished.  HENT:  Head: Normocephalic and atraumatic.  Eyes: Conjunctivae are normal. Right eye exhibits no discharge. Left eye exhibits no discharge.  Neck: Normal range of motion. Neck supple. No tracheal deviation present.  Cardiovascular: Normal rate and regular rhythm.   Pulmonary/Chest: Effort normal and breath sounds normal.  Abdominal: Soft. She exhibits no distension. There is no tenderness. There is no guarding.  Musculoskeletal: She exhibits tenderness. She exhibits no edema.  Significant tenderness left parasternal ribs  Neurological: She is alert and oriented to person, place, and time.  Skin: Skin is warm. No rash noted.  Psychiatric: She has a normal mood and affect.  Nursing note and vitals reviewed.   ED Course  Procedures (including critical care time) Labs Review Labs Reviewed  BASIC METABOLIC PANEL  CBC  I-STAT TROPOININ, ED    Imaging Review Dg Chest 2 View  08/24/2015  CLINICAL DATA:  Chest pain and cough. EXAM: CHEST  2 VIEW COMPARISON:  08/12/2015 and 09/20/2010 FINDINGS: The heart size and mediastinal contours are within normal limits. Both lungs are  clear. Chronic thoracic scoliosis with straightening of the thoracic kyphosis. No acute osseous abnormality. IMPRESSION: No acute abnormalities. Electronically Signed   By: Francene Boyers M.D.   On: 08/24/2015 11:14   I have personally reviewed and evaluated these images and lab results as part of my medical decision-making.   EKG Interpretation   Date/Time:  Monday August 24 2015 10:19:36 EST Ventricular Rate:  70 PR Interval:  112 QRS Duration: 71 QT Interval:  396 QTC Calculation: 427 R Axis:   77 Text Interpretation:  Sinus rhythm Borderline short PR interval similar  previous Confirmed by Frederick Klinger  MD, Ligaya Cormier ([id number]) on  08/24/2015 10:40:09 AM      MDM   Final diagnoses:  Chest wall pain   Overall healthy patient presents with atypical chest pain after a pop sound consistent with musculoskeletal. Plan for cardiac screen, Tylenol and close outpatient follow-up.  Results and differential diagnosis were discussed with the patient/parent/guardian. Xrays were independently reviewed by myself.  Close follow up outpatient was discussed, comfortable with the plan.   Medications  acetaminophen (TYLENOL) tablet 1,000 mg (1,000 mg Oral Given 08/24/15 1048)    Filed Vitals:   08/24/15 1015 08/24/15 1020  BP:  109/72  Pulse:  75  Temp:  98.1 F (36.7 C)  TempSrc:  Oral  Resp:  15  Height:   (1.727 m)  Weight:  140 lb (63.504 kg)  SpO2: 100% 98%    Final diagnoses:  Chest wall pain       Blane Ohara, MD 08/24/15 1152

## 2015-11-03 ENCOUNTER — Encounter (HOSPITAL_COMMUNITY): Payer: Self-pay

## 2015-11-03 ENCOUNTER — Emergency Department (HOSPITAL_COMMUNITY)
Admission: EM | Admit: 2015-11-03 | Discharge: 2015-11-03 | Disposition: A | Discharge status: Home / Self Care | Payer: Medicaid Other | Attending: Emergency Medicine | Admitting: Emergency Medicine

## 2015-11-03 DIAGNOSIS — Z8679 Personal history of other diseases of the circulatory system: Secondary | ICD-10-CM | POA: Insufficient documentation

## 2015-11-03 DIAGNOSIS — O9989 Other specified diseases and conditions complicating pregnancy, childbirth and the puerperium: Secondary | ICD-10-CM | POA: Insufficient documentation

## 2015-11-03 DIAGNOSIS — O99511 Diseases of the respiratory system complicating pregnancy, first trimester: Secondary | ICD-10-CM | POA: Insufficient documentation

## 2015-11-03 DIAGNOSIS — Z8619 Personal history of other infectious and parasitic diseases: Secondary | ICD-10-CM | POA: Insufficient documentation

## 2015-11-03 DIAGNOSIS — Z3490 Encounter for supervision of normal pregnancy, unspecified, unspecified trimester: Secondary | ICD-10-CM

## 2015-11-03 DIAGNOSIS — Z8719 Personal history of other diseases of the digestive system: Secondary | ICD-10-CM | POA: Insufficient documentation

## 2015-11-03 DIAGNOSIS — R110 Nausea: Secondary | ICD-10-CM | POA: Insufficient documentation

## 2015-11-03 DIAGNOSIS — R8271 Bacteriuria: Secondary | ICD-10-CM

## 2015-11-03 DIAGNOSIS — R51 Headache: Secondary | ICD-10-CM | POA: Insufficient documentation

## 2015-11-03 DIAGNOSIS — J45909 Unspecified asthma, uncomplicated: Secondary | ICD-10-CM | POA: Insufficient documentation

## 2015-11-03 DIAGNOSIS — Z8659 Personal history of other mental and behavioral disorders: Secondary | ICD-10-CM | POA: Insufficient documentation

## 2015-11-03 DIAGNOSIS — Z3A00 Weeks of gestation of pregnancy not specified: Secondary | ICD-10-CM | POA: Insufficient documentation

## 2015-11-03 DIAGNOSIS — Z79899 Other long term (current) drug therapy: Secondary | ICD-10-CM | POA: Insufficient documentation

## 2015-11-03 DIAGNOSIS — Z8751 Personal history of pre-term labor: Secondary | ICD-10-CM | POA: Insufficient documentation

## 2015-11-03 DIAGNOSIS — R519 Headache, unspecified: Secondary | ICD-10-CM

## 2015-11-03 LAB — URINALYSIS, ROUTINE W REFLEX MICROSCOPIC
Bilirubin Urine: NEGATIVE
GLUCOSE, UA: NEGATIVE mg/dL
HGB URINE DIPSTICK: NEGATIVE
KETONES UR: NEGATIVE mg/dL
Nitrite: NEGATIVE
PROTEIN: NEGATIVE mg/dL
Specific Gravity, Urine: 1.03 (ref 1.005–1.030)
pH: 6 (ref 5.0–8.0)

## 2015-11-03 LAB — URINE MICROSCOPIC-ADD ON

## 2015-11-03 LAB — CBC WITH DIFFERENTIAL/PLATELET
BASOS PCT: 0 %
Basophils Absolute: 0 10*3/uL (ref 0.0–0.1)
EOS ABS: 0.1 10*3/uL (ref 0.0–0.7)
Eosinophils Relative: 1 %
HCT: 38.8 % (ref 36.0–46.0)
HEMOGLOBIN: 13.5 g/dL (ref 12.0–15.0)
Lymphocytes Relative: 38 %
Lymphs Abs: 2.8 10*3/uL (ref 0.7–4.0)
MCH: 30.9 pg (ref 26.0–34.0)
MCHC: 34.8 g/dL (ref 30.0–36.0)
MCV: 88.8 fL (ref 78.0–100.0)
MONOS PCT: 5 %
Monocytes Absolute: 0.4 10*3/uL (ref 0.1–1.0)
NEUTROS PCT: 56 %
Neutro Abs: 4 10*3/uL (ref 1.7–7.7)
PLATELETS: 234 10*3/uL (ref 150–400)
RBC: 4.37 MIL/uL (ref 3.87–5.11)
RDW: 12.6 % (ref 11.5–15.5)
WBC: 7.2 10*3/uL (ref 4.0–10.5)

## 2015-11-03 LAB — BASIC METABOLIC PANEL
Anion gap: 8 (ref 5–15)
BUN: 11 mg/dL (ref 6–20)
CALCIUM: 9.2 mg/dL (ref 8.9–10.3)
CHLORIDE: 110 mmol/L (ref 101–111)
CO2: 20 mmol/L — ABNORMAL LOW (ref 22–32)
CREATININE: 0.85 mg/dL (ref 0.44–1.00)
Glucose, Bld: 117 mg/dL — ABNORMAL HIGH (ref 65–99)
Potassium: 3.5 mmol/L (ref 3.5–5.1)
SODIUM: 138 mmol/L (ref 135–145)

## 2015-11-03 LAB — I-STAT BETA HCG BLOOD, ED (MC, WL, AP ONLY): HCG, QUANTITATIVE: 20.6 m[IU]/mL — AB (ref <5)

## 2015-11-03 LAB — PREGNANCY, URINE: Preg Test, Ur: NEGATIVE

## 2015-11-03 LAB — HCG, SERUM, QUALITATIVE: PREG SERUM: POSITIVE — AB

## 2015-11-03 MED ORDER — DIPHENHYDRAMINE HCL 50 MG/ML IJ SOLN: 25.0000 mg | Freq: Once | INTRAMUSCULAR | Status: DC

## 2015-11-03 MED ORDER — ONDANSETRON 4 MG PO TBDP: ORAL_TABLET | ORAL | Status: DC

## 2015-11-03 MED ORDER — NITROFURANTOIN MONOHYD MACRO 100 MG PO CAPS: 100.0000 mg | ORAL_CAPSULE | Freq: Two times a day (BID) | ORAL | Status: DC

## 2015-11-03 MED ORDER — SODIUM CHLORIDE 0.9 % IV BOLUS (SEPSIS)
1000.0000 mL | Freq: Once | INTRAVENOUS | Status: AC
  Administered 2015-11-03: 1000 mL via INTRAVENOUS

## 2015-11-03 MED ORDER — METOCLOPRAMIDE HCL 5 MG/ML IJ SOLN
10.0000 mg | Freq: Once | INTRAMUSCULAR | Status: AC
  Administered 2015-11-03: 10 mg via INTRAVENOUS
  Filled 2015-11-03: qty 2

## 2015-11-03 MED ORDER — NITROFURANTOIN MONOHYD MACRO 100 MG PO CAPS
100.0000 mg | ORAL_CAPSULE | Freq: Once | ORAL | Status: AC
  Administered 2015-11-03: 100 mg via ORAL
  Filled 2015-11-03: qty 1

## 2015-11-03 NOTE — ED Provider Notes (Signed)
CSN: [id number]     Arrival date & time 11/03/15  1624 History   First MD Initiated Contact with Patient 11/03/15 2100     Chief Complaint  Patient presents with  . Headache     (Consider location/radiation/quality/duration/timing/severity/associated sxs/prior Treatment) The history is provided by the patient.  Nicole Gallegos is a 30 y.o. female hx of asthma, seizure, here with headache, nausea. Patient states that she has some nausea for the last 4 days as well as associated headaches. She states that she has a history of migraines and this is similar to her previous migraine.  She states that she has some blurry vision that resolved. Denies any fevers or vomiting but feels nauseated. Patient states that her period seems to be late for 2 weeks. She denies any vaginal bleeding or lower abdominal pain. She had one positive and one negative pregnancy test at home.    Past Medical History  Diagnosis Date  . Constipation   . Depression     No meds. currently  . Panic attacks     No meds currently;used to take meds in 2009  . Asthma     Exercise induced;inhaler prn  . Infection     Yeast;not frequent  . History of blood transfusion     Accident at 7 yoa and lost a lot of blood  . Preterm labor 2010    2nd child;had to stop labor 2 times;due to stress  . Stress     Per pt tends to worry about small things  . Seizures (HCC)     if gets overheated;no meds, last seizure 6 yrs ago  . Migraine    Past Surgical History  Procedure Laterality Date  . Foot surgery      R foot, repair torn tissue and skin graft  . Dilation and curettage of uterus      Suction D&C to remove 16 wk. fetal demise  . Wisdom tooth extraction  2008   Family History  Problem Relation Age of Onset  . Anesthesia problems Neg Hx   . Heart disease Maternal Aunt     Hole in the heart  . Heart disease Maternal Uncle     Hole in the heart  . Heart attack Maternal Grandmother     Deceased  . Hypertension  Maternal Uncle   . Hypertension Maternal Grandmother   . Hypertension Paternal Uncle   . Diabetes Paternal Grandmother   . Seizures Daughter     if gets overheated  . Seizures Daughter     if gets overheated  . Seizures Father     if gets overheated  . Bipolar disorder Cousin     maternal  . Depression Cousin     maternal  . Other Daughter     1st child had 2 vein umbilical cord; Pt did weekly NSTs.   Social History  Substance Use Topics  . Smoking status: Never Smoker   . Smokeless tobacco: Never Used  . Alcohol Use: No     Comment: Occasioanlly prior to pregnancy   OB History    Gravida Para Term Preterm AB TAB SAB Ectopic Multiple Living   0 0 3     Review of Systems  Neurological: Positive for headaches.  All other systems reviewed and are negative.     Allergies  Chocolate  Home Medications   Prior to Admission medications   Medication Sig Start Date End Date Taking? Authorizing  Provider  albuterol (PROVENTIL HFA;VENTOLIN HFA) 108 (90 Base) MCG/ACT inhaler Inhale 2 puffs into the lungs every 6 (six) hours as needed for wheezing or shortness of breath.   Yes Historical Provider, MD  ibuprofen (ADVIL,MOTRIN) 200 MG tablet Take 200 mg by mouth every 6 (six) hours as needed for headache.   Yes Historical Provider, MD  ibuprofen (ADVIL,MOTRIN) 600 MG tablet Take 1 tablet (600 mg total) by mouth every 6 (six) hours. Patient not taking: Reported on 08/12/2015 04/21/13   Sanda KleinShelley Lillard, CNM  metoCLOPramide (REGLAN) 10 MG tablet Take 1 tablet (10 mg total) by mouth every 6 (six) hours. Patient not taking: Reported on 08/12/2015 01/05/15   Oswaldo ConroyVictoria Creech, PA-C  nortriptyline (PAMELOR) 10 MG capsule Take 3 capsules (30 mg total) by mouth at bedtime. Start with 10mg (1 tab at night). Can increase to 2 tabs after 1 week. Patient not taking: Reported on 08/12/2015 04/21/15   Anson FretAntonia B Ahern, MD   BP 126/86 mmHg  Pulse 84  Temp(Src) 98.1 F (36.7 C) (Oral)  Resp  18  SpO2 94%  LMP 10/04/2015 Physical Exam  Constitutional: She is oriented to person, place, and time. She appears well-developed and well-nourished.  HENT:  Head: Normocephalic.  Mouth/Throat: Oropharynx is clear and moist.  Eyes: Conjunctivae are normal. Pupils are equal, round, and reactive to light.  Neck: Normal range of motion. Neck supple.  Cardiovascular: Normal rate, regular rhythm and normal heart sounds.   Pulmonary/Chest: Effort normal and breath sounds normal. No respiratory distress. She has no wheezes. She has no rales.  Abdominal: Soft. Bowel sounds are normal. She exhibits no distension. There is no tenderness. There is no rebound.  No abdominal tenderness   Musculoskeletal: Normal range of motion.  Neurological: She is alert and oriented to person, place, and time. No cranial nerve deficit. Coordination normal.  CN 2-12 intact, nl strength throughout   Skin: Skin is warm and dry.  Psychiatric: She has a normal mood and affect. Her behavior is normal. Judgment and thought content normal.  Nursing note and vitals reviewed.   ED Course  Procedures (including critical care time) Labs Review Labs Reviewed  BASIC METABOLIC PANEL - Abnormal; Notable for the following:    CO2 20 (*)    Glucose, Bld 117 (*)    All other components within normal limits  URINALYSIS, ROUTINE W REFLEX MICROSCOPIC (NOT [facility]) - Abnormal; Notable for the following:    APPearance CLOUDY (*)    Leukocytes, UA TRACE (*)    All other components within normal limits  URINE MICROSCOPIC-ADD ON - Abnormal; Notable for the following:    Squamous Epithelial / LPF 6-30 (*)    Bacteria, UA MANY (*)    All other components within normal limits  HCG, SERUM, QUALITATIVE - Abnormal; Notable for the following:    Preg, Serum WEAK POSITIVE (*)    All other components within normal limits  I-STAT BETA HCG BLOOD, ED ([facility], [facility], [facility] ONLY) - Abnormal; Notable for the following:    I-stat hCG, quantitative 20.6  (*)    All other components within normal limits  CBC WITH DIFFERENTIAL/PLATELET  PREGNANCY, URINE    Imaging Review No results found. I have personally reviewed and evaluated these images and lab results as part of my medical decision-making.   EKG Interpretation None      MDM   Final diagnoses:  None   Petronella D Ferner is a 30 y.o. female here with headache, nausea, possible pregnancy. No  lower abdominal pain or vaginal bleeding. I doubt ectopic. Symptoms can be from early pregnancy vs migraines. Will get pregnancy test and give reglan for headaches. Has nl neuro exam, will not need brain imaging.   11:03 PM UCG neg but istat hcg 20. Lab serum pregnancy mildly positive. No lower abdominal tenderness and no vaginal bleeding. I think likely mild hyperemesis. Recommend follow up with OB and prn zofran for nausea.      Richardean Canal, MD 11/03/15 [phone]

## 2015-11-03 NOTE — Discharge Instructions (Signed)
Take zofran for nausea.   Take tylenol for headache.   You have some bacteria in your urine. Take macrobid twice daily for 5 days.   Stay hydrated.   See OB outpatient.   Return to ER if you have worse headaches, vomiting, fevers, vaginal bleeding, abdominal pain.

## 2015-11-03 NOTE — ED Notes (Signed)
Pt here with headache started on Saturday.  Hx of migraines.  Nausea.  No vomiting.

## 2015-11-14 ENCOUNTER — Inpatient Hospital Stay (HOSPITAL_COMMUNITY)
Admission: AD | Admit: 2015-11-14 | Discharge: 2015-11-14 | Disposition: A | Discharge status: Home / Self Care | Payer: Medicaid Other | Source: Ambulatory Visit | Attending: Obstetrics & Gynecology | Admitting: Obstetrics & Gynecology

## 2015-11-14 ENCOUNTER — Encounter (HOSPITAL_COMMUNITY): Payer: Self-pay | Admitting: *Deleted

## 2015-11-14 ENCOUNTER — Inpatient Hospital Stay (HOSPITAL_COMMUNITY): Payer: Medicaid Other

## 2015-11-14 DIAGNOSIS — B9689 Other specified bacterial agents as the cause of diseases classified elsewhere: Secondary | ICD-10-CM

## 2015-11-14 DIAGNOSIS — Z91018 Allergy to other foods: Secondary | ICD-10-CM | POA: Diagnosis not present

## 2015-11-14 DIAGNOSIS — R109 Unspecified abdominal pain: Secondary | ICD-10-CM | POA: Diagnosis present

## 2015-11-14 DIAGNOSIS — O23591 Infection of other part of genital tract in pregnancy, first trimester: Secondary | ICD-10-CM | POA: Diagnosis not present

## 2015-11-14 DIAGNOSIS — O26899 Other specified pregnancy related conditions, unspecified trimester: Secondary | ICD-10-CM

## 2015-11-14 DIAGNOSIS — M419 Scoliosis, unspecified: Secondary | ICD-10-CM | POA: Insufficient documentation

## 2015-11-14 DIAGNOSIS — O9989 Other specified diseases and conditions complicating pregnancy, childbirth and the puerperium: Secondary | ICD-10-CM

## 2015-11-14 DIAGNOSIS — N760 Acute vaginitis: Secondary | ICD-10-CM

## 2015-11-14 DIAGNOSIS — J45909 Unspecified asthma, uncomplicated: Secondary | ICD-10-CM | POA: Diagnosis not present

## 2015-11-14 DIAGNOSIS — Z3A01 Less than 8 weeks gestation of pregnancy: Secondary | ICD-10-CM | POA: Diagnosis not present

## 2015-11-14 DIAGNOSIS — O26891 Other specified pregnancy related conditions, first trimester: Secondary | ICD-10-CM | POA: Diagnosis not present

## 2015-11-14 DIAGNOSIS — F329 Major depressive disorder, single episode, unspecified: Secondary | ICD-10-CM | POA: Insufficient documentation

## 2015-11-14 DIAGNOSIS — A499 Bacterial infection, unspecified: Secondary | ICD-10-CM | POA: Diagnosis not present

## 2015-11-14 DIAGNOSIS — O3680X0 Pregnancy with inconclusive fetal viability, not applicable or unspecified: Secondary | ICD-10-CM

## 2015-11-14 HISTORY — DX: Type 2 diabetes mellitus with hyperglycemia: E11.65

## 2015-11-14 HISTORY — DX: Scoliosis, unspecified: M41.9

## 2015-11-14 LAB — URINALYSIS, ROUTINE W REFLEX MICROSCOPIC
BILIRUBIN URINE: NEGATIVE
Glucose, UA: NEGATIVE mg/dL
Hgb urine dipstick: NEGATIVE
KETONES UR: 15 mg/dL — AB
Leukocytes, UA: NEGATIVE
NITRITE: NEGATIVE
Protein, ur: NEGATIVE mg/dL
SPECIFIC GRAVITY, URINE: 1.015 (ref 1.005–1.030)
pH: 7 (ref 5.0–8.0)

## 2015-11-14 LAB — CBC
HEMATOCRIT: 39.7 % (ref 36.0–46.0)
Hemoglobin: 13.3 g/dL (ref 12.0–15.0)
MCH: 30.5 pg (ref 26.0–34.0)
MCHC: 33.5 g/dL (ref 30.0–36.0)
MCV: 91.1 fL (ref 78.0–100.0)
Platelets: 241 10*3/uL (ref 150–400)
RBC: 4.36 MIL/uL (ref 3.87–5.11)
RDW: 13 % (ref 11.5–15.5)
WBC: 6.2 10*3/uL (ref 4.0–10.5)

## 2015-11-14 LAB — OB RESULTS CONSOLE GC/CHLAMYDIA: GC PROBE AMP, GENITAL: NEGATIVE

## 2015-11-14 LAB — WET PREP, GENITAL
SPERM: NONE SEEN
TRICH WET PREP: NONE SEEN
YEAST WET PREP: NONE SEEN

## 2015-11-14 LAB — HCG, QUANTITATIVE, PREGNANCY: hCG, Beta Chain, Quant, S: 2993 m[IU]/mL — ABNORMAL HIGH (ref <5)

## 2015-11-14 MED ORDER — METRONIDAZOLE 500 MG PO TABS: 500.0000 mg | ORAL_TABLET | Freq: Two times a day (BID) | ORAL | Status: DC

## 2015-11-14 NOTE — MAU Provider Note (Signed)
History     CSN: [id number]  Arrival date and time: 11/14/15 1734   First Provider Initiated Contact with Patient 11/14/15 1804      Chief Complaint  Patient presents with  . Fall   HPI Nicole Gallegos 30 y.o. [redacted]w[redacted]d  Recently learned she was pregnant.  Was taking the trash out today and was going down the steps at her apartment from the second floor.  Her ankle turned and she slipped down about 10 steps before stopping.  Mostly her hips and side of her fight leg took most of the brunt of the fall.  As the day has progressed she has developed lower abdominal cramping and is worried that the fall has affected this pregnancy.  Has not started prenatal care yet.  OB History    Gravida Para Term Preterm AB TAB SAB Ectopic Multiple Living   6 3 2 1 2 1 1  0 0 3      Past Medical History  Diagnosis Date  . Constipation   . Depression     No meds. currently  . Panic attacks     No meds currently;used to take meds in 2009  . Asthma     Exercise induced;inhaler prn  . Infection     Yeast;not frequent  . History of blood transfusion     Accident at 7 yoa and lost a lot of blood  . Preterm labor 2010    2nd child;had to stop labor 2 times;due to stress  . Stress     Per pt tends to worry about small things  . Migraine   . Seizures (HCC)     if gets overheated;no meds, last seizure 6 yrs ago  . Scoliosis   . Diabetes type 2, uncontrolled (HCC)     Past Surgical History  Procedure Laterality Date  . Foot surgery      R foot, repair torn tissue and skin graft  . Dilation and curettage of uterus      Suction D&C to remove 16 wk. fetal demise  . Wisdom tooth extraction  2008    Family History  Problem Relation Age of Onset  . Anesthesia problems Neg Hx   . Heart disease Maternal Aunt     Hole in the heart  . Heart disease Maternal Uncle     Hole in the heart  . Heart attack Maternal Grandmother     Deceased  . Hypertension Maternal Uncle   . Hypertension Maternal  Grandmother   . Hypertension Paternal Uncle   . Diabetes Paternal Grandmother   . Seizures Daughter     if gets overheated  . Seizures Daughter     if gets overheated  . Seizures Father     if gets overheated  . Bipolar disorder Cousin     maternal  . Depression Cousin     maternal  . Other Daughter     1st child had 2 vein umbilical cord; Pt did weekly NSTs.    Social History  Substance Use Topics  . Smoking status: Never Smoker   . Smokeless tobacco: Never Used  . Alcohol Use: No     Comment: Occasioanlly prior to pregnancy    Allergies:  Allergies  Allergen Reactions  . Chocolate Hives    Prescriptions prior to admission  Medication Sig Dispense Refill Last Dose  . albuterol (PROVENTIL HFA;VENTOLIN HFA) 108 (90 Base) MCG/ACT inhaler Inhale 2 puffs into the lungs every 6 (six) hours as needed for  wheezing or shortness of breath.   Past Month at Unknown time  . Prenatal Vit-Fe Fumarate-FA (PRENATAL MULTIVITAMIN) TABS tablet Take 1 tablet by mouth daily.   11/14/2015 at Unknown time  . nitrofurantoin, macrocrystal-monohydrate, (MACROBID) 100 MG capsule Take 1 capsule (100 mg total) by mouth 2 (two) times daily. (Patient not taking: Reported on 11/14/2015) 10 capsule 0   . [DISCONTINUED] ondansetron (ZOFRAN ODT) 4 MG disintegrating tablet  ODT q6 hours prn nausea/vomit (Patient not taking: Reported on 11/14/2015) 10 tablet 0     Review of Systems  Constitutional: Negative for fever.  Gastrointestinal: Positive for abdominal pain. Negative for nausea and vomiting.  Genitourinary:       No vaginal discharge. No vaginal bleeding. No leaking of fluid. No dysuria.   Physical Exam   Blood pressure 115/86, pulse 99, temperature 98.3 F (36.8 C), temperature source Oral, resp. rate 18, height  (1.727 m), weight 136 lb 9.6 oz (61.961 kg), last menstrual period 10/04/2015, unknown if currently breastfeeding.  Physical Exam  Nursing note and vitals  reviewed. Constitutional: She is oriented to person, place, and time. She appears well-developed and well-nourished.  HENT:  Head: Normocephalic.  Eyes: EOM are normal.  Neck: Neck supple.  GI: Soft. There is no tenderness.  Tender in lowest areas of the abdomen bilaterally - almost in groin.  Genitourinary:  Speculum exam: Vagina - Small amount of thin white discharge, no odor Cervix - No contact bleeding Bimanual exam: Cervix closed Uterus mildly tender, normal size Adnexa mild tenderness with palpation, no masses bilaterally GC/Chlam, wet prep done Chaperone present for exam.  Musculoskeletal: Normal range of motion.  Neurological: She is alert and oriented to person, place, and time.  Skin: Skin is warm and dry.  Psychiatric: She has a normal mood and affect.    MAU Course  Procedures Results for orders placed or performed during the hospital encounter of 11/14/15 (from the past 24 hour(s))  Urinalysis, Routine w reflex microscopic (not at [facility])     Status: Abnormal   Collection Time: 11/14/15  5:40 PM  Result Value Ref Range   Color, Urine YELLOW YELLOW   APPearance HAZY (A) CLEAR   Specific Gravity, Urine 1.015 1.005 - 1.030   pH 7.0 5.0 - 8.0   Glucose, UA NEGATIVE NEGATIVE mg/dL   Hgb urine dipstick NEGATIVE NEGATIVE   Bilirubin Urine NEGATIVE NEGATIVE   Ketones, ur 15 (A) NEGATIVE mg/dL   Protein, ur NEGATIVE NEGATIVE mg/dL   Nitrite NEGATIVE NEGATIVE   Leukocytes, UA NEGATIVE NEGATIVE  hCG, quantitative, pregnancy     Status: Abnormal   Collection Time: 11/14/15  6:18 PM  Result Value Ref Range   hCG, Beta Chain, Quant, S 2993 (H) <5 mIU/mL  CBC     Status: None   Collection Time: 11/14/15  6:19 PM  Result Value Ref Range   WBC 6.2 4.0 - 10.5 K/uL   RBC 4.36 3.87 - 5.11 MIL/uL   Hemoglobin 13.3 12.0 - 15.0 g/dL   HCT 16.1 09.6 - 04.5 %   MCV 91.1 78.0 - 100.0 fL   MCH 30.5 26.0 - 34.0 pg   MCHC 33.5 30.0 - 36.0 g/dL   RDW 40.9 81.1 - 91.4 %    Platelets 241 150 - 400 K/uL  Wet prep, genital     Status: Abnormal   Collection Time: 11/14/15  7:20 PM  Result Value Ref Range   Yeast Wet Prep HPF POC NONE SEEN NONE SEEN  Trich, Wet Prep NONE SEEN NONE SEEN   Clue Cells Wet Prep HPF POC PRESENT (A) NONE SEEN   WBC, Wet Prep HPF POC FEW (A) NONE SEEN   Sperm NONE SEEN     MDM Preliminary ultrasound result reviewed.  Questionable yolk sac.  Will follow up in 48 hours for repeat quant on Monday in the clinic. Care assumed by Harlon FlorJ. Jahniah Pallas, PA at [facility]2000  BURLESON,TERRI 11/14/2015, 6:12 PM   [place] Ob Comp Less 14 Wks  11/14/2015  EXAM: OBSTETRIC <14 WK US AND TRANSVAGINAL OB US TECHNIQUE: Both transabdominal and transvaginal ultrasound examinations were performed for complete evaluation of the gestation as well as the maternal uterus, adnexal regions, and pelvic cul-de-sac. Transvaginal technique was performed to assess early pregnancy. COMPARISON:  None. FINDINGS: Two rounded regions of fluid are seen within the fundal endometrium. 1 of these collections measures 7 x 3 mm and the other demonstrates a mean diameter of 7 mm. No definitive yolk sac or fetal pole seen in either collection. The ovaries are normal in appearance other than a corpus luteum cyst on the right. A small amount of simple fluid is seen in the cul-de-sac. IMPRESSION: 1. No definitive intrauterine pregnancy. The two rounded collections of fluid in the fundal endometrium could represent gestational sacs or pseudo gestational sacs. As a result, early pregnancy, recent miscarriage, and ectopic pregnancy are all still possible. That being said, I believe that 1 of the oval collections of fluid is likely an early gestational sac. Recommend follow-up as clinically warranted. Electronically Signed   By: Gerome Samavid  Williams III M.D   On: 11/14/2015 19:17   [place] Ob Transvaginal  11/14/2015  EXAM: OBSTETRIC <14 WK US AND TRANSVAGINAL OB US TECHNIQUE: Both transabdominal and transvaginal ultrasound  examinations were performed for complete evaluation of the gestation as well as the maternal uterus, adnexal regions, and pelvic cul-de-sac. Transvaginal technique was performed to assess early pregnancy. COMPARISON:  None. FINDINGS: Two rounded regions of fluid are seen within the fundal endometrium. 1 of these collections measures 7 x 3 mm and the other demonstrates a mean diameter of 7 mm. No definitive yolk sac or fetal pole seen in either collection. The ovaries are normal in appearance other than a corpus luteum cyst on the right. A small amount of simple fluid is seen in the cul-de-sac. IMPRESSION: 1. No definitive intrauterine pregnancy. The two rounded collections of fluid in the fundal endometrium could represent gestational sacs or pseudo gestational sacs. As a result, early pregnancy, recent miscarriage, and ectopic pregnancy are all still possible. That being said, I believe that 1 of the oval collections of fluid is likely an early gestational sac. Recommend follow-up as clinically warranted. Electronically Signed   By: Gerome Samavid  Williams III M.D   On: 11/14/2015 19:17     Assessment and Plan  A: Pregnancy of unknown location Fall in pregnancy Bacterial vaginosis Abdominal pain in pregnancy  P: Discharge home Rx for Flagyl given to patient  Ectopic/bleeding precautions discussed Tylenol PRN for pain advised Patient advised to follow-up with [facility] on Tuesday at 11:00 am for repeat hCG Patient may return to [facility] as needed or if her condition were to change or worsen  Marny LowensteinJulie N Claudette Wermuth, PA-C 11/14/2015 8:33 PM

## 2015-11-14 NOTE — Discharge Instructions (Signed)
Bacterial Vaginosis °Bacterial vaginosis is a vaginal infection that occurs when the normal balance of bacteria in the vagina is disrupted. It results from an overgrowth of certain bacteria. This is the most common vaginal infection in women of childbearing age. Treatment is important to prevent complications, especially in pregnant women, as it can cause a premature delivery. °CAUSES  °Bacterial vaginosis is caused by an increase in harmful bacteria that are normally present in smaller amounts in the vagina. Several different kinds of bacteria can cause bacterial vaginosis. However, the reason that the condition develops is not fully understood. °RISK FACTORS °Certain activities or behaviors can put you at an increased risk of developing bacterial vaginosis, including: °· Having a new sex partner or multiple sex partners. °· Douching. °· Using an intrauterine device (IUD) for contraception. °Women do not get bacterial vaginosis from toilet seats, bedding, swimming pools, or contact with objects around them. °SIGNS AND SYMPTOMS  °Some women with bacterial vaginosis have no signs or symptoms. Common symptoms include: °· Grey vaginal discharge. °· A fishlike odor with discharge, especially after sexual intercourse. °· Itching or burning of the vagina and vulva. °· Burning or pain with urination. °DIAGNOSIS  °Your health care provider will take a medical history and examine the vagina for signs of bacterial vaginosis. A sample of vaginal fluid may be taken. Your health care provider will look at this sample under a microscope to check for bacteria and abnormal cells. A vaginal pH test may also be done.  °TREATMENT  °Bacterial vaginosis may be treated with antibiotic medicines. These may be given in the form of a pill or a vaginal cream. A second round of antibiotics may be prescribed if the condition comes back after treatment. Because bacterial vaginosis increases your risk for sexually transmitted diseases, getting  treated can help reduce your risk for chlamydia, gonorrhea, HIV, and herpes. °HOME CARE INSTRUCTIONS  °· Only take over-the-counter or prescription medicines as directed by your health care provider. °· If antibiotic medicine was prescribed, take it as directed. Make sure you finish it even if you start to feel better. °· Tell all sexual partners that you have a vaginal infection. They should see their health care provider and be treated if they have problems, such as a mild rash or itching. °· During treatment, it is important that you follow these instructions: °· Avoid sexual activity or use condoms correctly. °· Do not douche. °· Avoid alcohol as directed by your health care provider. °· Avoid breastfeeding as directed by your health care provider. °SEEK MEDICAL CARE IF:  °· Your symptoms are not improving after 3 days of treatment. °· You have increased discharge or pain. °· You have a fever. °MAKE SURE YOU:  °· Understand these instructions. °· Will watch your condition. °· Will get help right away if you are not doing well or get worse. °FOR MORE INFORMATION  °Centers for Disease Control and Prevention, Division of STD Prevention: www.cdc.gov/std °American Sexual Health Association (ASHA): www.ashastd.org  °  °This information is not intended to replace advice given to you by your health care provider. Make sure you discuss any questions you have with your health care provider. °  °Document Released: 06/27/2005 Document Revised: 07/18/2014 Document Reviewed: 02/06/2013 °Elsevier Interactive Patient Education ©2016 Else[employer] Inc. °First Trimester of Pregnancy °The first trimester of pregnancy is from week 1 until the end of week 12 (months 1 through 3). During this time, your baby will begin to develop inside you. At 6-8 weeks,   the eyes and face are formed, and the heartbeat can be seen on ultrasound. At the end of 12 weeks, all the baby's organs are formed. Prenatal care is all the medical care you receive  before the birth of your baby. Make sure you get good prenatal care and follow all of your doctor's instructions. °HOME CARE  °Medicines °· Take medicine only as told by your doctor. Some medicines are safe and some are not during pregnancy. °· Take your prenatal vitamins as told by your doctor. °· Take medicine that helps you poop (stool softener) as needed if your doctor says it is okay. °Diet °· Eat regular, healthy meals. °· Your doctor will tell you the amount of weight gain that is right for you. °· Avoid raw meat and uncooked cheese. °· If you feel sick to your stomach (nauseous) or throw up (vomit): °¨ Eat 4 or 5 small meals a day instead of 3 large meals. °¨ Try eating a few soda crackers. °¨ Drink liquids between meals instead of during meals. °· If you have a hard time pooping (constipation): °¨ Eat high-fiber foods like fresh vegetables, fruit, and whole grains. °¨ Drink enough fluids to keep your pee (urine) clear or pale yellow. °Activity and Exercise °· Exercise only as told by your doctor. Stop exercising if you have cramps or pain in your lower belly (abdomen) or low back. °· Try to avoid standing for long periods of time. Move your legs often if you must stand in one place for a long time. °· Avoid heavy lifting. °· Wear low-heeled shoes. Sit and stand up straight. °· You can have sex unless your doctor tells you not to. °Relief of Pain or Discomfort °· Wear a good support bra if your breasts are sore. °· Take warm water baths (sitz baths) to soothe pain or discomfort caused by hemorrhoids. Use hemorrhoid cream if your doctor says it is okay. °· Rest with your legs raised if you have leg cramps or low back pain. °· Wear support hose if you have puffy, bulging veins (varicose veins) in your legs. Raise (elevate) your feet for 15 minutes, 3-4 times a day. Limit salt in your diet. °Prenatal Care °· Schedule your prenatal visits by the twelfth week of pregnancy. °· Write down your questions. Take them  to your prenatal visits. °· Keep all your prenatal visits as told by your doctor. °Safety °· Wear your seat belt at all times when driving. °· Make a list of emergency phone numbers. The list should include numbers for family, friends, the hospital, and police and fire departments. °General Tips °· Ask your doctor for a referral to a local prenatal class. Begin classes no later than at the start of month 6 of your pregnancy. °· Ask for help if you need counseling or help with nutrition. Your doctor can give you advice or tell you where to go for help. °· Do not use hot tubs, steam rooms, or saunas. °· Do not douche or use tampons or scented sanitary pads. °· Do not cross your legs for long periods of time. °· Avoid litter boxes and soil used by cats. °· Avoid all smoking, herbs, and alcohol. Avoid drugs not approved by your doctor. °· Do not use any tobacco products, including cigarettes, chewing tobacco, and electronic cigarettes. If you need help quitting, ask your doctor. You may get counseling or other support to help you quit. °· Visit your dentist. At home, brush your teeth with a soft   toothbrush. Be gentle when you floss. GET HELP IF:  You are dizzy.  You have mild cramps or pressure in your lower belly.  You have a nagging pain in your belly area.  You continue to feel sick to your stomach, throw up, or have watery poop (diarrhea).  You have a bad smelling fluid coming from your vagina.  You have pain with peeing (urination).  You have increased puffiness (swelling) in your face, hands, legs, or ankles. GET HELP RIGHT AWAY IF:   You have a fever.  You are leaking fluid from your vagina.  You have spotting or bleeding from your vagina.  You have very bad belly cramping or pain.  You gain or lose weight rapidly.  You throw up blood. It may look like coffee grounds.  You are around people who have [place] measles, fifth disease, or chickenpox.  You have a very bad headache.  You  have shortness of breath.  You have any kind of trauma, such as from a fall or a car accident.   This information is not intended to replace advice given to you by your health care provider. Make sure you discuss any questions you have with your health care provider.   Document Released: 12/14/2007 Document Revised: 07/18/2014 Document Reviewed: 05/07/2013 Elsevier Interactive Patient Education [employer]

## 2015-11-14 NOTE — MAU Note (Signed)
Pt states recently found out she was pregnant.  Fell down the stairs this morning at 0700 and began having abdominal cramps at 1630 with pain 7/10.  Denies vaginal bleeding/discharge.

## 2015-11-15 LAB — RPR: RPR: NONREACTIVE

## 2015-11-15 LAB — HIV ANTIBODY (ROUTINE TESTING W REFLEX): HIV Screen 4th Generation wRfx: NONREACTIVE

## 2015-11-16 LAB — GC/CHLAMYDIA PROBE AMP (~~LOC~~) NOT AT ARMC
Chlamydia: NEGATIVE
NEISSERIA GONORRHEA: NEGATIVE

## 2015-11-17 ENCOUNTER — Telehealth: Payer: Self-pay

## 2015-11-17 ENCOUNTER — Other Ambulatory Visit: Payer: Medicaid Other

## 2015-11-17 NOTE — Telephone Encounter (Signed)
Pt miss her appointment today for repeat lab Pt was supposed to be seen today for repeat BHCG and miss her appointment. I have left a message for patient to call us back to rescheduled.

## 2015-11-19 ENCOUNTER — Other Ambulatory Visit: Payer: Medicaid Other

## 2015-11-19 DIAGNOSIS — O3680X0 Pregnancy with inconclusive fetal viability, not applicable or unspecified: Secondary | ICD-10-CM

## 2015-11-20 LAB — HCG, QUANTITATIVE, PREGNANCY: HCG, BETA CHAIN, QUANT, S: 11758.5 m[IU]/mL — AB

## 2015-11-23 NOTE — Telephone Encounter (Signed)
I have left a message for patient to call is back regarding rescheduling her lab draw for a repeat beta

## 2015-12-04 ENCOUNTER — Telehealth: Payer: Self-pay | Admitting: *Deleted

## 2015-12-04 DIAGNOSIS — Z3490 Encounter for supervision of normal pregnancy, unspecified, unspecified trimester: Secondary | ICD-10-CM

## 2015-12-04 NOTE — Telephone Encounter (Signed)
Patient called back into front office. Informed patient of results & need for u/s appt & appt scheduled for 6/2 @ 8am & clinic appt to follow. Patient verbalized understanding to all & had no questions

## 2015-12-04 NOTE — Telephone Encounter (Signed)
Pt returning call for results

## 2015-12-04 NOTE — Telephone Encounter (Signed)
-----   Message from Levie HeritageJacob J Stinson, DO sent at 11/20/2015  8:14 AM EDT ----- Needs follow up US in 10 days.

## 2015-12-04 NOTE — Telephone Encounter (Signed)
Attempted to call patient. There was no answer. Voice mail left stating I am calling regarding her test results, please return my call at the clinics.

## 2015-12-11 ENCOUNTER — Ambulatory Visit (INDEPENDENT_AMBULATORY_CARE_PROVIDER_SITE_OTHER): Payer: Medicaid Other | Admitting: Family Medicine

## 2015-12-11 ENCOUNTER — Encounter: Payer: Self-pay | Admitting: Family Medicine

## 2015-12-11 ENCOUNTER — Ambulatory Visit (HOSPITAL_COMMUNITY)
Admission: RE | Admit: 2015-12-11 | Discharge: 2015-12-11 | Disposition: A | Discharge status: Home / Self Care | Payer: Medicaid Other | Source: Ambulatory Visit | Attending: Family Medicine | Admitting: Family Medicine

## 2015-12-11 DIAGNOSIS — O468X1 Other antepartum hemorrhage, first trimester: Secondary | ICD-10-CM | POA: Diagnosis not present

## 2015-12-11 DIAGNOSIS — O3481 Maternal care for other abnormalities of pelvic organs, first trimester: Secondary | ICD-10-CM | POA: Diagnosis not present

## 2015-12-11 DIAGNOSIS — Z331 Pregnant state, incidental: Secondary | ICD-10-CM | POA: Diagnosis present

## 2015-12-11 DIAGNOSIS — Z3490 Encounter for supervision of normal pregnancy, unspecified, unspecified trimester: Secondary | ICD-10-CM

## 2015-12-11 DIAGNOSIS — O9989 Other specified diseases and conditions complicating pregnancy, childbirth and the puerperium: Secondary | ICD-10-CM

## 2015-12-11 DIAGNOSIS — N83201 Unspecified ovarian cyst, right side: Secondary | ICD-10-CM | POA: Diagnosis not present

## 2015-12-11 DIAGNOSIS — Z3A08 8 weeks gestation of pregnancy: Secondary | ICD-10-CM | POA: Insufficient documentation

## 2015-12-11 DIAGNOSIS — R109 Unspecified abdominal pain: Secondary | ICD-10-CM
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
## 2015-12-30 ENCOUNTER — Inpatient Hospital Stay (HOSPITAL_COMMUNITY)
Admission: AD | Admit: 2015-12-30 | Discharge: 2015-12-30 | Disposition: A | Discharge status: Home / Self Care | Payer: Medicaid Other | Source: Ambulatory Visit | Attending: Obstetrics and Gynecology | Admitting: Obstetrics and Gynecology

## 2015-12-30 ENCOUNTER — Encounter (HOSPITAL_COMMUNITY): Payer: Self-pay | Admitting: *Deleted

## 2015-12-30 DIAGNOSIS — O9989 Other specified diseases and conditions complicating pregnancy, childbirth and the puerperium: Secondary | ICD-10-CM | POA: Diagnosis not present

## 2015-12-30 DIAGNOSIS — O219 Vomiting of pregnancy, unspecified: Secondary | ICD-10-CM

## 2015-12-30 DIAGNOSIS — R110 Nausea: Secondary | ICD-10-CM | POA: Diagnosis present

## 2015-12-30 DIAGNOSIS — O210 Mild hyperemesis gravidarum: Secondary | ICD-10-CM | POA: Insufficient documentation

## 2015-12-30 DIAGNOSIS — R51 Headache: Secondary | ICD-10-CM | POA: Diagnosis present

## 2015-12-30 DIAGNOSIS — G43009 Migraine without aura, not intractable, without status migrainosus: Secondary | ICD-10-CM | POA: Diagnosis not present

## 2015-12-30 DIAGNOSIS — O26891 Other specified pregnancy related conditions, first trimester: Secondary | ICD-10-CM | POA: Diagnosis present

## 2015-12-30 LAB — URINALYSIS, ROUTINE W REFLEX MICROSCOPIC
Bilirubin Urine: NEGATIVE
GLUCOSE, UA: NEGATIVE mg/dL
Hgb urine dipstick: NEGATIVE
Ketones, ur: NEGATIVE mg/dL
LEUKOCYTES UA: NEGATIVE
NITRITE: NEGATIVE
PROTEIN: NEGATIVE mg/dL
SPECIFIC GRAVITY, URINE: 1.025 (ref 1.005–1.030)
pH: 6 (ref 5.0–8.0)

## 2015-12-30 LAB — COMPREHENSIVE METABOLIC PANEL
ALBUMIN: 3.7 g/dL (ref 3.5–5.0)
ALK PHOS: 60 U/L (ref 38–126)
ALT: 14 U/L (ref 14–54)
AST: 16 U/L (ref 15–41)
Anion gap: 6 (ref 5–15)
BILIRUBIN TOTAL: 0.4 mg/dL (ref 0.3–1.2)
BUN: 8 mg/dL (ref 6–20)
CALCIUM: 9.4 mg/dL (ref 8.9–10.3)
CO2: 26 mmol/L (ref 22–32)
CREATININE: 0.54 mg/dL (ref 0.44–1.00)
Chloride: 104 mmol/L (ref 101–111)
GFR calc Af Amer: >60 mL/min (ref >60)
GFR calc non Af Amer: >60 mL/min (ref >60)
GLUCOSE: 82 mg/dL (ref 65–99)
Potassium: 4 mmol/L (ref 3.5–5.1)
Sodium: 136 mmol/L (ref 135–145)
TOTAL PROTEIN: 6.7 g/dL (ref 6.5–8.1)

## 2015-12-30 LAB — CBC
HEMATOCRIT: 40.3 % (ref 36.0–46.0)
HEMOGLOBIN: 13.9 g/dL (ref 12.0–15.0)
MCH: 31 pg (ref 26.0–34.0)
MCHC: 34.5 g/dL (ref 30.0–36.0)
MCV: 90 fL (ref 78.0–100.0)
Platelets: 230 10*3/uL (ref 150–400)
RBC: 4.48 MIL/uL (ref 3.87–5.11)
RDW: 13 % (ref 11.5–15.5)
WBC: 7.4 10*3/uL (ref 4.0–10.5)

## 2015-12-30 MED ORDER — DIPHENHYDRAMINE HCL 50 MG/ML IJ SOLN
25.0000 mg | Freq: Once | INTRAMUSCULAR | Status: AC
Start: 2015-12-30 — End: 2015-12-30
  Administered 2015-12-30: 25 mg via INTRAVENOUS
  Filled 2015-12-30: qty 1

## 2015-12-30 MED ORDER — SODIUM CHLORIDE 0.9 % IV SOLN
INTRAVENOUS | Status: DC
  Administered 2015-12-30: 11:00:00 via INTRAVENOUS

## 2015-12-30 MED ORDER — PROMETHAZINE HCL 12.5 MG PO TABS: 12.5000 mg | ORAL_TABLET | Freq: Four times a day (QID) | ORAL | Status: DC | PRN

## 2015-12-30 MED ORDER — DEXAMETHASONE SODIUM PHOSPHATE 10 MG/ML IJ SOLN
10.0000 mg | Freq: Once | INTRAMUSCULAR | Status: AC
  Administered 2015-12-30: 10 mg via INTRAVENOUS
  Filled 2015-12-30: qty 1

## 2015-12-30 MED ORDER — METOCLOPRAMIDE HCL 5 MG/ML IJ SOLN
10.0000 mg | Freq: Once | INTRAMUSCULAR | Status: AC
  Administered 2015-12-30: 10 mg via INTRAVENOUS
  Filled 2015-12-30: qty 2

## 2015-12-30 NOTE — Discharge Instructions (Signed)
Morning Sickness Morning sickness is when you feel sick to your stomach (nauseous) during pregnancy. This nauseous feeling may or may not come with vomiting. It often occurs in the morning but can be a problem any time of day. Morning sickness is most common during the first trimester, but it may continue throughout pregnancy. While morning sickness is unpleasant, it is usually harmless unless you develop severe and continual vomiting (hyperemesis gravidarum). This condition requires more intense treatment.  CAUSES  The cause of morning sickness is not completely known but seems to be related to normal hormonal changes that occur in pregnancy. RISK FACTORS You are at greater risk if you:  Experienced nausea or vomiting before your pregnancy.  Had morning sickness during a previous pregnancy.  Are pregnant with more than one baby, such as twins. TREATMENT  Do not use any medicines (prescription, over-the-counter, or herbal) for morning sickness without first talking to your health care provider. Your health care provider may prescribe or recommend:  Vitamin B6 supplements.  Anti-nausea medicines.  The herbal medicine ginger. HOME CARE INSTRUCTIONS   Only take over-the-counter or prescription medicines as directed by your health care provider.  Taking multivitamins before getting pregnant can prevent or decrease the severity of morning sickness in most women.  Eat a piece of dry toast or unsalted crackers before getting out of bed in the morning.  Eat five or six small meals a day.  Eat dry and bland foods (rice, baked potato). Foods high in carbohydrates are often helpful.  Do not drink liquids with your meals. Drink liquids between meals.  Avoid greasy, fatty, and spicy foods.  Get someone to cook for you if the smell of any food causes nausea and vomiting.  If you feel nauseous after taking prenatal vitamins, take the vitamins at night or with a snack.  Snack on protein  foods (nuts, yogurt, cheese) between meals if you are hungry.  Eat unsweetened gelatins for desserts.  Wearing an acupressure wristband (worn for sea sickness) may be helpful.  Acupuncture may be helpful.  Do not smoke.  Get a humidifier to keep the air in your house free of odors.  Get plenty of fresh air. SEEK MEDICAL CARE IF:   Your home remedies are not working, and you need medicine.  You feel dizzy or lightheaded.  You are losing weight. SEEK IMMEDIATE MEDICAL CARE IF:   You have persistent and uncontrolled nausea and vomiting.  You pass out (faint). MAKE SURE YOU:  Understand these instructions.  Will watch your condition.  Will get help right away if you are not doing well or get worse.   This information is not intended to replace advice given to you by your health care provider. Make sure you discuss any questions you have with your health care provider.   Document Released: 08/18/2006 Document Revised: 07/02/2013 Document Reviewed: 12/12/2012 Elsevier Interactive Patient Education 2016 [employer] Migraine Headache A migraine headache is an intense, throbbing pain on one or both sides of your head. A migraine can last for 30 minutes to several hours. CAUSES  The exact cause of a migraine headache is not always known. However, a migraine may be caused when nerves in the brain become irritated and release chemicals that cause inflammation. This causes pain. Certain things may also trigger migraines, such as:  Alcohol.  Smoking.  Stress.  Menstruation.  Aged cheeses.  Foods or drinks that contain nitrates, glutamate, aspartame, or tyramine.  Lack of sleep.  Chocolate.  Caffeine.  Hunger.  Physical exertion.  Fatigue.  Medicines used to treat chest pain (nitroglycerine), birth control pills, estrogen, and some blood pressure medicines. SIGNS AND SYMPTOMS  Pain on one or both sides of your head.  Pulsating or throbbing pain.  Severe  pain that prevents daily activities.  Pain that is aggravated by any physical activity.  Nausea, vomiting, or both.  Dizziness.  Pain with exposure to bright lights, loud noises, or activity.  General sensitivity to bright lights, loud noises, or smells. Before you get a migraine, you may get warning signs that a migraine is coming (aura). An aura may include:  Seeing flashing lights.  Seeing bright spots, halos, or zigzag lines.  Having tunnel vision or blurred vision.  Having feelings of numbness or tingling.  Having trouble talking.  Having muscle weakness. DIAGNOSIS  A migraine headache is often diagnosed based on:  Symptoms.  Physical exam.  A CT scan or MRI of your head. These imaging tests cannot diagnose migraines, but they can help rule out other causes of headaches. TREATMENT Medicines may be given for pain and nausea. Medicines can also be given to help prevent recurrent migraines.  HOME CARE INSTRUCTIONS  Only take over-the-counter or prescription medicines for pain or discomfort as directed by your health care provider. The use of long-term narcotics is not recommended.  Lie down in a dark, quiet room when you have a migraine.  Keep a journal to find out what may trigger your migraine headaches. For example, write down:  What you eat and drink.  How much sleep you get.  Any change to your diet or medicines.  Limit alcohol consumption.  Quit smoking if you smoke.  Get 7-9 hours of sleep, or as recommended by your health care provider.  Limit stress.  Keep lights dim if bright lights bother you and make your migraines worse. SEEK IMMEDIATE MEDICAL CARE IF:   Your migraine becomes severe.  You have a fever.  You have a stiff neck.  You have vision loss.  You have muscular weakness or loss of muscle control.  You start losing your balance or have trouble walking.  You feel faint or pass out.  You have severe symptoms that are different  from your first symptoms. MAKE SURE YOU:   Understand these instructions.  Will watch your condition.  Will get help right away if you are not doing well or get worse.   This information is not intended to replace advice given to you by your health care provider. Make sure you discuss any questions you have with your health care provider.   Document Released: 06/27/2005 Document Revised: 07/18/2014 Document Reviewed: 03/04/2013 Elsevier Interactive Patient Education [employer]

## 2015-12-30 NOTE — MAU Note (Signed)
Pt C/O HA x 3 days, dry cough - wakes her up.  Feeling weak, nauseated but no vomiting.  Has taken tylenol for HA but did not help.  Intermittent lower abd & back pain, denies bleeding.

## 2015-12-30 NOTE — MAU Provider Note (Signed)
History     CSN: [id number]  Arrival date and time: 12/30/15 5956   First Provider Initiated Contact with Patient 12/30/15 1029      Chief Complaint  Patient presents with  . Headache  . Nausea   HPI  Nicole Gallegos is a 30 y.o. [id number] at [redacted]w[redacted]d who presents with headache and nausea. PMH significant for migraines. Current headache x 3 days. Reports constant temporal/frontal headache that is throbbing in nature. Rates pain 9/10. Aggravated by loud noises. No alleviating factors. Has tried tylenol and lying down in dark room without relief. Associated symptom of nausea. Also reports lower abdominal cramping this week that she rates 6/10. No treatment. Denies fever, vision changes, vomiting, vaginal bleeding, diarrhea, constipation, or vaginal discharge.   OB History    Gravida Para Term Preterm AB TAB SAB Ectopic Multiple Living   0 0 3      Past Medical History  Diagnosis Date  . Constipation   . Depression     No meds. currently  . Panic attacks     No meds currently;used to take meds in 2009  . Asthma     Exercise induced;inhaler prn  . Infection     Yeast;not frequent  . History of blood transfusion     Accident at 7 yoa and lost a lot of blood  . Preterm labor 2010    2nd child;had to stop labor 2 times;due to stress  . Stress     Per pt tends to worry about small things  . Migraine   . Seizures (HCC)     if gets overheated;no meds, last seizure 6 yrs ago  . Scoliosis   . Diabetes type 2, uncontrolled (HCC)     Past Surgical History  Procedure Laterality Date  . Foot surgery      R foot, repair torn tissue and skin graft  . Dilation and curettage of uterus      Suction D&C to remove 16 wk. fetal demise  . Wisdom tooth extraction  2008    Family History  Problem Relation Age of Onset  . Anesthesia problems Neg Hx   . Heart disease Maternal Aunt     Hole in the heart  . Heart disease Maternal Uncle     Hole in the heart  . Heart attack  Maternal Grandmother     Deceased  . Hypertension Maternal Uncle   . Hypertension Maternal Grandmother   . Hypertension Paternal Uncle   . Diabetes Paternal Grandmother   . Seizures Daughter     if gets overheated  . Seizures Daughter     if gets overheated  . Seizures Father     if gets overheated  . Bipolar disorder Cousin     maternal  . Depression Cousin     maternal  . Other Daughter     1st child had 2 vein umbilical cord; Pt did weekly NSTs.    Social History  Substance Use Topics  . Smoking status: Never Smoker   . Smokeless tobacco: Never Used  . Alcohol Use: No     Comment: Occasioanlly prior to pregnancy    Allergies:  Allergies  Allergen Reactions  . Chocolate Hives    Prescriptions prior to admission  Medication Sig Dispense Refill Last Dose  . albuterol (PROVENTIL HFA;VENTOLIN HFA) 108 (90 Base) MCG/ACT inhaler Inhale 2 puffs into the lungs every 6 (six) hours as needed for wheezing or shortness  of breath.   Past Month at Unknown time  . metroNIDAZOLE (FLAGYL) 500 MG tablet Take 1 tablet (500 mg total) by mouth 2 (two) times daily. 14 tablet 0   . Prenatal Vit-Fe Fumarate-FA (PRENATAL MULTIVITAMIN) TABS tablet Take 1 tablet by mouth daily.   11/14/2015 at Unknown time    Review of Systems  Constitutional: Negative.   HENT: Negative for congestion and tinnitus.   Eyes: Negative.   Gastrointestinal: Positive for nausea and abdominal pain. Negative for vomiting.  Genitourinary: Negative.   Neurological: Positive for headaches. Negative for dizziness, sensory change and seizures.   Physical Exam   Blood pressure 108/63, pulse 99, temperature 98.2 F (36.8 C), temperature source Oral, resp. rate 16, height 5\' 8"  (1.727 m), weight 138 lb (62.596 kg), last menstrual period 10/04/2015, unknown if currently breastfeeding.  Physical Exam  Nursing note and vitals reviewed. Constitutional: She is oriented to person, place, and time. She appears well-developed  and well-nourished. No distress.  HENT:  Head: Normocephalic and atraumatic.  Eyes: Conjunctivae are normal. Right eye exhibits no discharge. Left eye exhibits no discharge. No scleral icterus.  Neck: Normal range of motion.  Cardiovascular: Normal rate, regular rhythm and normal heart sounds.   No murmur heard. Respiratory: Effort normal and breath sounds normal. No respiratory distress. She has no wheezes.  GI: Soft. Bowel sounds are normal. She exhibits no distension. There is no tenderness.  Genitourinary:  Cervix closed  Neurological: She is alert and oriented to person, place, and time.  Skin: Skin is warm and dry. She is not diaphoretic.  Psychiatric: She has a normal mood and affect. Her behavior is normal. Judgment and thought content normal.    MAU Course  Procedures Results for orders placed or performed during the hospital encounter of 12/30/15 (from the past 24 hour(s))  Urinalysis, Routine w reflex microscopic (not at [facility])     Status: None   Collection Time: 12/30/15  9:53 AM  Result Value Ref Range   Color, Urine YELLOW YELLOW   APPearance CLEAR CLEAR   Specific Gravity, Urine 1.025 1.005 - 1.030   pH 6.0 5.0 - 8.0   Glucose, UA NEGATIVE NEGATIVE mg/dL   Hgb urine dipstick NEGATIVE NEGATIVE   Bilirubin Urine NEGATIVE NEGATIVE   Ketones, ur NEGATIVE NEGATIVE mg/dL   Protein, ur NEGATIVE NEGATIVE mg/dL   Nitrite NEGATIVE NEGATIVE   Leukocytes, UA NEGATIVE NEGATIVE  CBC     Status: None   Collection Time: 12/30/15 11:27 AM  Result Value Ref Range   WBC 7.4 4.0 - 10.5 K/uL   RBC 4.48 3.87 - 5.11 MIL/uL   Hemoglobin 13.9 12.0 - 15.0 g/dL   HCT 16.140.3 09.636.0 - 04.546.0 %   MCV 90.0 78.0 - 100.0 fL   MCH 31.0 26.0 - 34.0 pg   MCHC 34.5 30.0 - 36.0 g/dL   RDW 40.913.0 81.111.5 - 91.415.5 %   Platelets 230 150 - 400 K/uL  Comprehensive metabolic panel     Status: None   Collection Time: 12/30/15 11:27 AM  Result Value Ref Range   Sodium 136 135 - 145 mmol/L   Potassium 4.0 3.5 -  5.1 mmol/L   Chloride 104 101 - 111 mmol/L   CO2 26 22 - 32 mmol/L   Glucose, Bld 82 65 - 99 mg/dL   BUN 8 6 - 20 mg/dL   Creatinine, Ser 7.820.54 0.44 - 1.00 mg/dL   Calcium 9.4 8.9 - 95.610.3 mg/dL   Total Protein 6.7 6.5 -  8.1 g/dL   Albumin 3.7 3.5 - 5.0 g/dL   AST 16 15 - 41 U/L   ALT 14 14 - 54 U/L   Alkaline Phosphatase 60 38 - 126 U/L   Total Bilirubin 0.4 0.3 - 1.2 mg/dL   GFR calc non Af Amer >60 >60 mL/min   GFR calc Af Amer >60 >60 mL/min   Anion gap 6 5 - 15    MDM FHT 155 by doppler VSS IV headache cocktail (decadron, reglan, benadryl) Pt reports resolution of symptoms Assessment and Plan  A: 1. Migraine without aura and without status migrainosus, not intractable   2. Nausea and vomiting during pregnancy prior to [redacted] weeks gestation     P: Discharge home Rx phenergan Has initial ob appt in [facility] next month Discussed reasons to return to MAU   Judeth Horn 12/30/2015, 10:28 AM

## 2016-01-08 ENCOUNTER — Telehealth: Payer: Self-pay | Admitting: *Deleted

## 2016-01-08 NOTE — Telephone Encounter (Signed)
Pt called and stated that she would like a call to discuss some concerns that she has. She reports pelvic pressure and some cramping. Pt informed that some cramping can be normal, pt denies any bleeding or vaginal discharge.

## 2016-01-13 ENCOUNTER — Encounter: Payer: Self-pay | Admitting: Family

## 2016-01-13 ENCOUNTER — Ambulatory Visit (INDEPENDENT_AMBULATORY_CARE_PROVIDER_SITE_OTHER): Payer: Medicaid Other | Admitting: Family

## 2016-01-13 ENCOUNTER — Other Ambulatory Visit (HOSPITAL_COMMUNITY)
Admission: RE | Admit: 2016-01-13 | Discharge: 2016-01-13 | Disposition: A | Discharge status: Home / Self Care | Payer: Medicaid Other | Source: Ambulatory Visit | Attending: Family | Admitting: Family

## 2016-01-13 VITALS — BP 109/75 | HR 89 | Wt 134.8 lb

## 2016-01-13 DIAGNOSIS — O09212 Supervision of pregnancy with history of pre-term labor, second trimester: Secondary | ICD-10-CM | POA: Diagnosis not present

## 2016-01-13 DIAGNOSIS — Z36 Encounter for antenatal screening of mother: Secondary | ICD-10-CM | POA: Diagnosis not present

## 2016-01-13 DIAGNOSIS — Z113 Encounter for screening for infections with a predominantly sexual mode of transmission: Secondary | ICD-10-CM | POA: Insufficient documentation

## 2016-01-13 DIAGNOSIS — F329 Major depressive disorder, single episode, unspecified: Secondary | ICD-10-CM | POA: Diagnosis not present

## 2016-01-13 DIAGNOSIS — O09219 Supervision of pregnancy with history of pre-term labor, unspecified trimester: Secondary | ICD-10-CM | POA: Insufficient documentation

## 2016-01-13 DIAGNOSIS — F32A Depression, unspecified: Secondary | ICD-10-CM

## 2016-01-13 DIAGNOSIS — Z3689 Encounter for other specified antenatal screening: Secondary | ICD-10-CM

## 2016-01-13 DIAGNOSIS — O99340 Other mental disorders complicating pregnancy, unspecified trimester: Secondary | ICD-10-CM | POA: Diagnosis not present

## 2016-01-13 DIAGNOSIS — Z3492 Encounter for supervision of normal pregnancy, unspecified, second trimester: Secondary | ICD-10-CM

## 2016-01-13 DIAGNOSIS — O09892 Supervision of other high risk pregnancies, second trimester: Secondary | ICD-10-CM

## 2016-01-13 DIAGNOSIS — O0992 Supervision of high risk pregnancy, unspecified, second trimester: Secondary | ICD-10-CM

## 2016-01-13 DIAGNOSIS — O0990 Supervision of high risk pregnancy, unspecified, unspecified trimester: Secondary | ICD-10-CM | POA: Insufficient documentation

## 2016-01-13 LAB — POCT URINALYSIS DIP (DEVICE)
BILIRUBIN URINE: NEGATIVE
Glucose, UA: NEGATIVE mg/dL
HGB URINE DIPSTICK: NEGATIVE
KETONES UR: NEGATIVE mg/dL
Leukocytes, UA: NEGATIVE
Nitrite: NEGATIVE
PH: 5.5 (ref 5.0–8.0)
PROTEIN: NEGATIVE mg/dL
Specific Gravity, Urine: 1.025 (ref 1.005–1.030)
Urobilinogen, UA: 1 mg/dL (ref 0.0–1.0)

## 2016-01-13 NOTE — Patient Instructions (Signed)

## 2016-01-13 NOTE — Addendum Note (Signed)
Addended by: Jill SideAY, Igor Bishop L on: 01/13/2016 02:16 PM   Modules accepted: Orders

## 2016-01-13 NOTE — Progress Notes (Signed)
Subjective:    Nicole Gallegos is a [id number] 36236w3d being seen today for her first obstetrical visit.  Dated by 8 wk ultrasound.  Her obstetrical history is significant for depression/anxiety preterm labor (x2) and preterm delivery x 1.  Reports preterm labor with second child, but delivered at 37 wks.  Also NSVD at 36 wks. Patient does not intend to breast feed. Pregnancy history fully reviewed.  Patient reports no complaints.  Filed Vitals:   01/13/16 0957  BP: 109/75  Pulse: 89  Weight: 134 lb 12.8 oz (61.145 kg)    HISTORY: OB History  Gravida Para Term Preterm AB SAB TAB Ectopic Multiple Living  6 3 2 1 2 1 1  0 0 3    # Outcome Date GA Lbr Len/2nd Weight Sex Delivery Anes PTL Lv  6 Current           5 Preterm 04/19/13 6411w1d 06:02 / 00:05 5 lb 8.7 oz (2.515 kg) F Vag-Spont None  Y  4 TAB 2013 2436w3d            Comments: No complications  3 SAB 2011 4882w0d            Comments: D&C;no complications  2 Term 10/02/08 3819w0d  5 lb 7 oz (2.466 kg) F Vag-Spont None  Y     Comments: No complications  1 Term 12/12/05 2349w0d  7 lb 7 oz (3.374 kg) F Vag-Spont None  Y     Comments: No complications     Past Medical History  Diagnosis Date  . Constipation   . Depression     No meds. currently  . Panic attacks     No meds currently;used to take meds in 2009  . Asthma     Exercise induced;inhaler prn  . History of blood transfusion     Accident at 7 yoa and lost a lot of blood  . Preterm labor 2010    2nd child;had to stop labor 2 times;due to stress  . Migraine   . Seizures (HCC)     if gets overheated;no meds, last seizure 6 yrs ago  . Scoliosis   . Diabetes type 2, uncontrolled (HCC)    Past Surgical History  Procedure Laterality Date  . Foot surgery      R foot, repair torn tissue and skin graft  . Dilation and curettage of uterus      Suction D&C to remove 16 wk. fetal demise  . Wisdom tooth extraction  2008   Family History  Problem Relation Age of Onset  .  Anesthesia problems Neg Hx   . Heart disease Maternal Aunt     Hole in the heart  . Heart disease Maternal Uncle     Hole in the heart  . Heart attack Maternal Grandmother     Deceased  . Hypertension Maternal Uncle   . Hypertension Maternal Grandmother   . Hypertension Paternal Uncle   . Diabetes Paternal Grandmother   . Seizures Daughter     if gets overheated  . Seizures Daughter     if gets overheated  . Seizures Father     if gets overheated  . Bipolar disorder Cousin     maternal  . Depression Cousin     maternal  . Other Daughter     1st child had 2 vein umbilical cord; Pt did weekly NSTs.     Exam    Uterus:  Fundal Height: 14 cm   Filed Vitals:  01/13/16 0957  BP: 109/75  Pulse: 89   System: Breast:  No nipple retraction or dimpling, No nipple discharge or bleeding, No axillary or supraclavicular adenopathy, Normal to palpation without dominant masses   Skin: normal coloration and turgor, no rashes    Neurologic: negative   Extremities: normal strength, tone, and muscle mass   HEENT neck supple with midline trachea and thyroid without masses   Mouth/Teeth mucous membranes moist, pharynx normal without lesions   Neck supple and no masses   Cardiovascular: regular rate and rhythm, no murmurs or gallops   Respiratory:  appears well, vitals normal, no respiratory distress, acyanotic, normal RR, neck free of mass or lymphadenopathy, chest clear, no wheezing, crepitations, rhonchi, normal symmetric air entry   Abdomen: soft, non-tender; bowel sounds normal; no masses,  no organomegaly     Assessment:    Pregnancy: [id number] Patient Active Problem List   Diagnosis Date Noted  . Supervision of high risk pregnancy, antepartum 01/13/2016  . High risk pregnancy due to history of preterm labor, antepartum 01/13/2016  . Tension headache, chronic 04/21/2015  . Migraine 04/21/2015  . Anxiety and depression 09/24/2012  . History of blood transfusion 09/24/2012  .  Asthma, chronic 09/24/2012  . Panic attacks 09/24/2012  . Seizures (HCC) 09/24/2012  . Premature labor 09/24/2012  . Prior pregnancy with fetal demise at 1716 weeks 09/24/2012        Plan:     Unable to stay for new OB labs, will return on Friday for lab only visit. 17-p application completed today. Prenatal vitamins. Problem list reviewed and updated. Genetic Screening discussed Quad Screen: declined.  Ultrasound discussed; fetal survey: ordered.  Follow up in 3 weeks for initial 17-p.   Marlis EdelsonKARIM, WALIDAH N 01/13/2016

## 2016-01-13 NOTE — Progress Notes (Addendum)
New ob packet given  Anatomy US scheduled for August 11th @ 0815. Pt notified.   [employer] application faxed.  Informed pt to be expecting a call from them.   ROI filled out for request of pap smear from [facility].   Home Medicaid Form Completed

## 2016-01-14 LAB — GC/CHLAMYDIA PROBE AMP (~~LOC~~) NOT AT ARMC
Chlamydia: NEGATIVE
NEISSERIA GONORRHEA: NEGATIVE

## 2016-01-14 LAB — CULTURE, OB URINE

## 2016-01-15 ENCOUNTER — Other Ambulatory Visit: Payer: Medicaid Other

## 2016-01-16 LAB — PAIN MGMT, PROFILE 6 CONF W/O MM, U
6 Acetylmorphine: NEGATIVE ng/mL (ref <10)
Alcohol Metabolites: NEGATIVE ng/mL (ref <500)
Amphetamines: NEGATIVE ng/mL (ref <500)
BENZODIAZEPINES: NEGATIVE ng/mL (ref <100)
Barbiturates: NEGATIVE ng/mL (ref <300)
Cocaine Metabolite: NEGATIVE ng/mL (ref <150)
Creatinine: 221 mg/dL (ref >=20.0)
MARIJUANA METABOLITE: NEGATIVE ng/mL (ref <20)
Methadone Metabolite: NEGATIVE ng/mL (ref <100)
OPIATES: NEGATIVE ng/mL (ref <100)
OXIDANT: NEGATIVE ug/mL (ref <200)
OXYCODONE: NEGATIVE ng/mL (ref <100)
PLEASE NOTE: 0
Phencyclidine: NEGATIVE ng/mL (ref <25)
pH: 6.6 (ref 4.5–9.0)

## 2016-01-19 ENCOUNTER — Other Ambulatory Visit: Payer: Medicaid Other

## 2016-01-19 DIAGNOSIS — O0990 Supervision of high risk pregnancy, unspecified, unspecified trimester: Secondary | ICD-10-CM

## 2016-01-20 LAB — PRENATAL PROFILE (SOLSTAS)
Antibody Screen: NEGATIVE
BASOS PCT: 0 %
Basophils Absolute: 0 cells/uL (ref 0–200)
EOS ABS: 53 {cells}/uL (ref 15–500)
Eosinophils Relative: 1 %
HEMATOCRIT: 40.1 % (ref 35.0–45.0)
HEP B S AG: NEGATIVE
HIV: NONREACTIVE
Hemoglobin: 13.4 g/dL (ref 11.7–15.5)
LYMPHS ABS: 1378 {cells}/uL (ref 850–3900)
Lymphocytes Relative: 26 %
MCH: 30.5 pg (ref 27.0–33.0)
MCHC: 33.4 g/dL (ref 32.0–36.0)
MCV: 91.3 fL (ref 80.0–100.0)
MONO ABS: 424 {cells}/uL (ref 200–950)
MPV: 10.8 fL (ref 7.5–12.5)
Monocytes Relative: 8 %
NEUTROS ABS: 3445 {cells}/uL (ref 1500–7800)
Neutrophils Relative %: 65 %
PLATELETS: 241 10*3/uL (ref 140–400)
RBC: 4.39 MIL/uL (ref 3.80–5.10)
RDW: 13.2 % (ref 11.0–15.0)
RH TYPE: POSITIVE
Rubella: 3.47 Index — ABNORMAL HIGH (ref <0.90)
WBC: 5.3 10*3/uL (ref 3.8–10.8)

## 2016-01-21 LAB — HEMOGLOBINOPATHY EVALUATION
HCT: 40.1 % (ref 35.0–45.0)
HEMOGLOBIN: 13.4 g/dL (ref 11.7–15.5)
Hgb A2 Quant: 2.3 % (ref 1.8–3.5)
Hgb A: 96.7 % (ref >96.0)
Hgb F Quant: <1 % (ref <2.0)
MCH: 30.5 pg (ref 27.0–33.0)
MCV: 91.3 fL (ref 80.0–100.0)
RBC: 4.39 MIL/uL (ref 3.80–5.10)
RDW: 13.2 % (ref 11.0–15.0)

## 2016-02-02 ENCOUNTER — Encounter: Payer: Self-pay | Admitting: Student

## 2016-02-02 ENCOUNTER — Ambulatory Visit (INDEPENDENT_AMBULATORY_CARE_PROVIDER_SITE_OTHER): Payer: Self-pay | Admitting: Clinical

## 2016-02-02 ENCOUNTER — Ambulatory Visit (INDEPENDENT_AMBULATORY_CARE_PROVIDER_SITE_OTHER): Payer: Medicaid Other | Admitting: Student

## 2016-02-02 VITALS — BP 108/70 | HR 80 | Wt 135.9 lb

## 2016-02-02 DIAGNOSIS — O09219 Supervision of pregnancy with history of pre-term labor, unspecified trimester: Secondary | ICD-10-CM

## 2016-02-02 DIAGNOSIS — O09212 Supervision of pregnancy with history of pre-term labor, second trimester: Secondary | ICD-10-CM | POA: Diagnosis present

## 2016-02-02 DIAGNOSIS — F4322 Adjustment disorder with anxiety: Secondary | ICD-10-CM

## 2016-02-02 DIAGNOSIS — O289 Unspecified abnormal findings on antenatal screening of mother: Secondary | ICD-10-CM

## 2016-02-02 DIAGNOSIS — O283 Abnormal ultrasonic finding on antenatal screening of mother: Secondary | ICD-10-CM

## 2016-02-02 DIAGNOSIS — O0992 Supervision of high risk pregnancy, unspecified, second trimester: Secondary | ICD-10-CM

## 2016-02-02 MED ORDER — HYDROXYPROGESTERONE CAPROATE 250 MG/ML IM OIL
250.0000 mg | TOPICAL_OIL | Freq: Once | INTRAMUSCULAR | Status: AC
  Administered 2016-02-02: 250 mg via INTRAMUSCULAR

## 2016-02-02 NOTE — Patient Instructions (Signed)

## 2016-02-02 NOTE — Progress Notes (Signed)
Subjective:  Nicole Gallegos is a 30 y.o. [id number] at [redacted]w[redacted]d being seen today for ongoing prenatal care.  She is currently monitored for the following issues for this high-risk pregnancy and has Anxiety and depression; History of blood transfusion; Asthma, chronic; Panic attacks; Seizures (HCC); Prior pregnancy with fetal demise at 16 weeks; Tension headache, chronic; Migraine; Supervision of high risk pregnancy, antepartum; High risk pregnancy due to history of preterm labor, antepartum; and Depression affecting pregnancy, antepartum on her problem list.  Patient reports no complaints.  Contractions: Not present.  .  Movement: Absent. Denies leaking of fluid.   The following portions of the patient's history were reviewed and updated as appropriate: allergies, current medications, past family history, past medical history, past social history, past surgical history and problem list. Problem list updated.  Objective:   Vitals:   02/02/16 1451  BP: 108/70  Pulse: 80  Weight: 135 lb 14.4 oz (61.6 kg)    Fetal Status: Fetal Heart Rate (bpm): 155   Movement: Absent     General:  Alert, oriented and cooperative. Patient is in no acute distress.  Skin: Skin is warm and dry. No rash noted.   Cardiovascular: Normal heart rate noted  Respiratory: Normal respiratory effort, no problems with respiration noted  Abdomen: Soft, gravid, appropriate for gestational age. Pain/Pressure: Present     Pelvic:  Cervical exam deferred        Extremities: Normal range of motion.  Edema: None  Mental Status: Normal mood and affect. Normal behavior. Normal judgment and thought content.   Urinalysis:      Assessment and Plan:  Pregnancy: [id number] at [redacted]w[redacted]d  1. Supervision of high risk pregnancy, antepartum, second trimester   2. High risk pregnancy due to history of preterm labor, antepartum -waiting for 17 P    Preterm labor symptoms and general obstetric precautions including but not limited to vaginal  bleeding, contractions, leaking of fluid and fetal movement were reviewed in detail with the patient. Please refer to After Visit Summary for other counseling recommendations.  Return in about 4 weeks (around 03/01/2016) for Routine OB.   Judeth Horn, NP

## 2016-02-02 NOTE — Progress Notes (Signed)
  ASSESSMENT: Pt currently experiencing Adjustment disorder with anxious mood. Pt needs to f/u with OB.  Pt would benefit from psychoeducation and brief therapeutic interventions regarding coping with symptoms of anxiety. Stage of Change: contemplative  PLAN: 1. F/U with behavioral health clinician in as needed 2. Psychiatric Medications: none 3. Behavioral recommendations:   -Continue to keep positive outlook -Practice daily relaxation breathing technique, as practiced in office visit, before bedtime -Read educational material regarding coping with life stressors (to reduce anxiety)  5. From scale of 1-10, how likely are you to follow plan:10  SUBJECTIVE: Pt. referred by Judeth Horn, NP for symptoms of anxiety and depression Pt. reports the following symptoms/concerns: Pt states that she is planning her wedding, that will take place in two weeks; wedding plans and unexpected (but wanted) pregnancy have caused an increase in feeling "stressed" and anxious. Pt primary concern is having a difficult time going to sleep, staying asleep, and does have some headaches when stressed; tries to stay positive to cope. Duration of problem: Increase in past month Severity: .mild(depressive)/moderate(anxious)   OBJECTIVE: Orientation & Cognition: Oriented x3. Thought processes normal and appropriate to situation. Mood: appropriate. Affect: appropriate Appearance: appropriate Risk of harm to self or others:  Substance use: none Assessments administered: PHQ9: 11/GAD7: 15  Diagnosis: Adjustment disorder with anxious mood CPT Code: F43.22  -------------------------------------------- Other(s) present in the room: none  Time spent with patient in exam room: 25 minutes to 3:00-3:25pm   Depression screen PHQ 2/9 02/02/2016  Decreased Interest 2  Down, Depressed, Hopeless 2  PHQ - 2 Score 4  Altered sleeping 3  Tired, decreased energy 3  Change in appetite 1  Feeling bad or failure about  yourself  0  Trouble concentrating 0  Moving slowly or fidgety/restless 0  Suicidal thoughts 0  PHQ-9 Score 11   GAD 7 : Generalized Anxiety Score 02/02/2016  Nervous, Anxious, on Edge 3  Control/stop worrying 2  Worry too much - different things 2  Trouble relaxing 3  Restless 2  Easily annoyed or irritable 2  Afraid - awful might happen 1  Total GAD 7 Score 15

## 2016-02-02 NOTE — Progress Notes (Signed)
1 hour gtt given  

## 2016-02-03 ENCOUNTER — Encounter: Payer: Medicaid Other | Admitting: Advanced Practice Midwife

## 2016-02-03 LAB — GLUCOSE TOLERANCE, 1 HOUR (50G) W/O FASTING: GLUCOSE, 1 HR, GESTATIONAL: 100 mg/dL (ref <140)

## 2016-02-09 ENCOUNTER — Ambulatory Visit (INDEPENDENT_AMBULATORY_CARE_PROVIDER_SITE_OTHER): Payer: Medicaid Other | Admitting: General Practice

## 2016-02-09 VITALS — BP 98/70 | HR 115 | Ht 68.0 in | Wt 136.0 lb

## 2016-02-09 DIAGNOSIS — O09212 Supervision of pregnancy with history of pre-term labor, second trimester: Secondary | ICD-10-CM | POA: Diagnosis present

## 2016-02-09 MED ORDER — HYDROXYPROGESTERONE CAPROATE 250 MG/ML IM OIL
250.0000 mg | TOPICAL_OIL | Freq: Once | INTRAMUSCULAR | Status: AC
  Administered 2016-02-09: 250 mg via INTRAMUSCULAR

## 2016-02-10 ENCOUNTER — Encounter (HOSPITAL_COMMUNITY): Payer: Self-pay | Admitting: Family

## 2016-02-12 ENCOUNTER — Telehealth: Payer: Self-pay | Admitting: *Deleted

## 2016-02-12 ENCOUNTER — Inpatient Hospital Stay (HOSPITAL_COMMUNITY)
Admission: AD | Admit: 2016-02-12 | Discharge: 2016-02-12 | Disposition: A | Discharge status: Home / Self Care | Payer: Medicaid Other | Source: Ambulatory Visit | Attending: Obstetrics & Gynecology | Admitting: Obstetrics & Gynecology

## 2016-02-12 ENCOUNTER — Encounter (HOSPITAL_COMMUNITY): Payer: Self-pay | Admitting: *Deleted

## 2016-02-12 DIAGNOSIS — G43909 Migraine, unspecified, not intractable, without status migrainosus: Secondary | ICD-10-CM | POA: Insufficient documentation

## 2016-02-12 DIAGNOSIS — J45909 Unspecified asthma, uncomplicated: Secondary | ICD-10-CM | POA: Insufficient documentation

## 2016-02-12 DIAGNOSIS — Z3A17 17 weeks gestation of pregnancy: Secondary | ICD-10-CM | POA: Insufficient documentation

## 2016-02-12 DIAGNOSIS — O99342 Other mental disorders complicating pregnancy, second trimester: Secondary | ICD-10-CM | POA: Insufficient documentation

## 2016-02-12 DIAGNOSIS — F329 Major depressive disorder, single episode, unspecified: Secondary | ICD-10-CM | POA: Insufficient documentation

## 2016-02-12 DIAGNOSIS — O99352 Diseases of the nervous system complicating pregnancy, second trimester: Secondary | ICD-10-CM | POA: Diagnosis not present

## 2016-02-12 DIAGNOSIS — N898 Other specified noninflammatory disorders of vagina: Secondary | ICD-10-CM

## 2016-02-12 DIAGNOSIS — R42 Dizziness and giddiness: Secondary | ICD-10-CM | POA: Diagnosis not present

## 2016-02-12 DIAGNOSIS — Z79899 Other long term (current) drug therapy: Secondary | ICD-10-CM | POA: Diagnosis not present

## 2016-02-12 DIAGNOSIS — O99282 Endocrine, nutritional and metabolic diseases complicating pregnancy, second trimester: Secondary | ICD-10-CM | POA: Diagnosis not present

## 2016-02-12 DIAGNOSIS — O99512 Diseases of the respiratory system complicating pregnancy, second trimester: Secondary | ICD-10-CM | POA: Insufficient documentation

## 2016-02-12 DIAGNOSIS — B9689 Other specified bacterial agents as the cause of diseases classified elsewhere: Secondary | ICD-10-CM

## 2016-02-12 DIAGNOSIS — O24912 Unspecified diabetes mellitus in pregnancy, second trimester: Secondary | ICD-10-CM | POA: Diagnosis not present

## 2016-02-12 DIAGNOSIS — N760 Acute vaginitis: Secondary | ICD-10-CM

## 2016-02-12 DIAGNOSIS — E860 Dehydration: Secondary | ICD-10-CM | POA: Diagnosis not present

## 2016-02-12 DIAGNOSIS — O26892 Other specified pregnancy related conditions, second trimester: Secondary | ICD-10-CM | POA: Diagnosis not present

## 2016-02-12 LAB — WET PREP, GENITAL
Sperm: NONE SEEN
TRICH WET PREP: NONE SEEN
YEAST WET PREP: NONE SEEN

## 2016-02-12 LAB — CBC WITH DIFFERENTIAL/PLATELET
BASOS ABS: 0 10*3/uL (ref 0.0–0.1)
BASOS PCT: 0 %
EOS PCT: 1 %
Eosinophils Absolute: 0.1 10*3/uL (ref 0.0–0.7)
HCT: 36.3 % (ref 36.0–46.0)
Hemoglobin: 12.4 g/dL (ref 12.0–15.0)
Lymphocytes Relative: 27 %
Lymphs Abs: 1.7 10*3/uL (ref 0.7–4.0)
MCH: 30.2 pg (ref 26.0–34.0)
MCHC: 34.2 g/dL (ref 30.0–36.0)
MCV: 88.5 fL (ref 78.0–100.0)
MONO ABS: 0.4 10*3/uL (ref 0.1–1.0)
Monocytes Relative: 6 %
Neutro Abs: 4.3 10*3/uL (ref 1.7–7.7)
Neutrophils Relative %: 66 %
PLATELETS: 203 10*3/uL (ref 150–400)
RBC: 4.1 MIL/uL (ref 3.87–5.11)
RDW: 13 % (ref 11.5–15.5)
WBC: 6.5 10*3/uL (ref 4.0–10.5)

## 2016-02-12 LAB — URINALYSIS, ROUTINE W REFLEX MICROSCOPIC
BILIRUBIN URINE: NEGATIVE
Glucose, UA: NEGATIVE mg/dL
Hgb urine dipstick: NEGATIVE
Ketones, ur: NEGATIVE mg/dL
NITRITE: NEGATIVE
PH: 6 (ref 5.0–8.0)
Protein, ur: NEGATIVE mg/dL
SPECIFIC GRAVITY, URINE: 1.015 (ref 1.005–1.030)

## 2016-02-12 LAB — URINE MICROSCOPIC-ADD ON: RBC / HPF: NONE SEEN RBC/hpf (ref 0–5)

## 2016-02-12 MED ORDER — SODIUM CHLORIDE 0.9 % IV SOLN
INTRAVENOUS | Status: DC
  Administered 2016-02-12: 15:00:00 via INTRAVENOUS

## 2016-02-12 MED ORDER — METRONIDAZOLE 500 MG PO TABS: 500.0000 mg | ORAL_TABLET | Freq: Two times a day (BID) | ORAL | 0 refills | Status: AC

## 2016-02-12 NOTE — Telephone Encounter (Signed)
Called patient. She stated she is leaking off and on for the last 2 days, feeling dizzy, cramping. She is having to wear a pad because she is staying wet. She doesn't think this is urine. I advised her that the clinic is closed for the day and she should go the MAU to be evaluated. Understanding was voiced.

## 2016-02-12 NOTE — MAU Note (Signed)
Pt presents to MAU with complaints of dizziness and an increase in vaginal discharge for 2 days. Denies any vaginal bleeding. Pt has progesterone 17P every tuesday
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
## 2016-02-12 NOTE — Discharge Instructions (Signed)
Dizziness Dizziness is a common problem. It is a feeling of unsteadiness or light-headedness. You may feel like you are about to faint. Dizziness can lead to injury if you stumble or fall. Anyone can become dizzy, but dizziness is more common in older adults. This condition can be caused by a number of things, including medicines, dehydration, or illness. HOME CARE INSTRUCTIONS Taking these steps may help with your condition: Eating and Drinking  Drink enough fluid to keep your urine clear or pale yellow. This helps to keep you from becoming dehydrated. Try to drink more clear fluids, such as water.  Do not drink alcohol.  Limit your caffeine intake if directed by your health care provider.  Limit your salt intake if directed by your health care provider. Activity  Avoid making quick movements.  Rise slowly from chairs and steady yourself until you feel okay.  In the morning, first sit up on the side of the bed. When you feel okay, stand slowly while you hold onto something until you know that your balance is fine.  Move your legs often if you need to stand in one place for a long time. Tighten and relax your muscles in your legs while you are standing.  Do not drive or operate heavy machinery if you feel dizzy.  Avoid bending down if you feel dizzy. Place items in your home so that they are easy for you to reach without leaning over. Lifestyle  Do not use any tobacco products, including cigarettes, chewing tobacco, or electronic cigarettes. If you need help quitting, ask your health care provider.  Try to reduce your stress level, such as with yoga or meditation. Talk with your health care provider if you need help. General Instructions  Watch your dizziness for any changes.  Take medicines only as directed by your health care provider. Talk with your health care provider if you think that your dizziness is caused by a medicine that you are taking.  Tell a friend or a family  member that you are feeling dizzy. If he or she notices any changes in your behavior, have this person call your health care provider.  Keep all follow-up visits as directed by your health care provider. This is important. SEEK MEDICAL CARE IF:  Your dizziness does not go away.  Your dizziness or light-headedness gets worse.  You feel nauseous.  You have reduced hearing.  You have new symptoms.  You are unsteady on your feet or you feel like the room is spinning. SEEK IMMEDIATE MEDICAL CARE IF:  You vomit or have diarrhea and are unable to eat or drink anything.  You have problems talking, walking, swallowing, or using your arms, hands, or legs.  You feel generally weak.  You are not thinking clearly or you have trouble forming sentences. It may take a friend or family member to notice this.  You have chest pain, abdominal pain, shortness of breath, or sweating.  Your vision changes.  You notice any bleeding.  You have a headache.  You have neck pain or a stiff neck.  You have a fever.   This information is not intended to replace advice given to you by your health care provider. Make sure you discuss any questions you have with your health care provider.   Document Released: 12/21/2000 Document Revised: 11/11/2014 Document Reviewed: 06/23/2014 Elsevier Interactive Patient Education 2016 Elsevier Inc. Bacterial Vaginosis Bacterial vaginosis is a vaginal infection that occurs when the normal balance of bacteria in the vagina  is disrupted. It results from an overgrowth of certain bacteria. This is the most common vaginal infection in women of childbearing age. Treatment is important to prevent complications, especially in pregnant women, as it can cause a premature delivery. CAUSES  Bacterial vaginosis is caused by an increase in harmful bacteria that are normally present in smaller amounts in the vagina. Several different kinds of bacteria can cause bacterial vaginosis.  However, the reason that the condition develops is not fully understood. RISK FACTORS Certain activities or behaviors can put you at an increased risk of developing bacterial vaginosis, including:  Having a new sex partner or multiple sex partners.  Douching.  Using an intrauterine device (IUD) for contraception. Women do not get bacterial vaginosis from toilet seats, bedding, swimming pools, or contact with objects around them. SIGNS AND SYMPTOMS  Some women with bacterial vaginosis have no signs or symptoms. Common symptoms include:  Grey vaginal discharge.  A fishlike odor with discharge, especially after sexual intercourse.  Itching or burning of the vagina and vulva.  Burning or pain with urination. DIAGNOSIS  Your health care provider will take a medical history and examine the vagina for signs of bacterial vaginosis. A sample of vaginal fluid may be taken. Your health care provider will look at this sample under a microscope to check for bacteria and abnormal cells. A vaginal pH test may also be done.  TREATMENT  Bacterial vaginosis may be treated with antibiotic medicines. These may be given in the form of a pill or a vaginal cream. A second round of antibiotics may be prescribed if the condition comes back after treatment. Because bacterial vaginosis increases your risk for sexually transmitted diseases, getting treated can help reduce your risk for chlamydia, gonorrhea, HIV, and herpes. HOME CARE INSTRUCTIONS   Only take over-the-counter or prescription medicines as directed by your health care provider.  If antibiotic medicine was prescribed, take it as directed. Make sure you finish it even if you start to feel better.  Tell all sexual partners that you have a vaginal infection. They should see their health care provider and be treated if they have problems, such as a mild rash or itching.  During treatment, it is important that you follow these instructions:  Avoid  sexual activity or use condoms correctly.  Do not douche.  Avoid alcohol as directed by your health care provider.  Avoid breastfeeding as directed by your health care provider. SEEK MEDICAL CARE IF:   Your symptoms are not improving after 3 days of treatment.  You have increased discharge or pain.  You have a fever. MAKE SURE YOU:   Understand these instructions.  Will watch your condition.  Will get help right away if you are not doing well or get worse. FOR MORE INFORMATION  Centers for Disease Control and Prevention, Division of STD Prevention: SolutionApps.co.za American Sexual Health Association (ASHA): www.ashastd.org    This information is not intended to replace advice given to you by your health care provider. Make sure you discuss any questions you have with your health care provider.   Document Released: 06/27/2005 Document Revised: 07/18/2014 Document Reviewed: 02/06/2013 Elsevier Interactive Patient Education [employer]

## 2016-02-12 NOTE — Telephone Encounter (Signed)
Received a message left on the nurse voicemail on 02/12/16 at 0832.  Patient states she has been having some leakage for 2 days.  States she is also dizzy and having pain in the lower part of her abdomen.  Wants to know what she should do.  Requests a return call to [phone].

## 2016-02-16 ENCOUNTER — Ambulatory Visit (INDEPENDENT_AMBULATORY_CARE_PROVIDER_SITE_OTHER): Payer: Medicaid Other

## 2016-02-16 VITALS — BP 105/72 | HR 76 | Wt 138.5 lb

## 2016-02-16 DIAGNOSIS — O09212 Supervision of pregnancy with history of pre-term labor, second trimester: Secondary | ICD-10-CM

## 2016-02-16 MED ORDER — HYDROXYPROGESTERONE CAPROATE 250 MG/ML IM OIL
250.0000 mg | TOPICAL_OIL | Freq: Once | INTRAMUSCULAR | Status: AC
  Administered 2016-02-16: 250 mg via INTRAMUSCULAR

## 2016-02-16 NOTE — Progress Notes (Signed)
Patient presented to office today for her Makena injection. Patient tolerated well and will follow up next week for her next one.  

## 2016-02-19 ENCOUNTER — Ambulatory Visit (HOSPITAL_COMMUNITY)
Admission: RE | Admit: 2016-02-19 | Discharge: 2016-02-19 | Disposition: A | Discharge status: Home / Self Care | Payer: Medicaid Other | Source: Ambulatory Visit | Attending: Family | Admitting: Family

## 2016-02-19 ENCOUNTER — Other Ambulatory Visit: Payer: Self-pay | Admitting: Family

## 2016-02-19 DIAGNOSIS — Z3689 Encounter for other specified antenatal screening: Secondary | ICD-10-CM

## 2016-02-19 DIAGNOSIS — Z3A18 18 weeks gestation of pregnancy: Secondary | ICD-10-CM | POA: Insufficient documentation

## 2016-02-19 DIAGNOSIS — O283 Abnormal ultrasonic finding on antenatal screening of mother: Secondary | ICD-10-CM

## 2016-02-19 DIAGNOSIS — Z36 Encounter for antenatal screening of mother: Secondary | ICD-10-CM | POA: Diagnosis not present

## 2016-02-19 DIAGNOSIS — O09892 Supervision of other high risk pregnancies, second trimester: Secondary | ICD-10-CM

## 2016-02-19 DIAGNOSIS — Z315 Encounter for genetic counseling: Secondary | ICD-10-CM | POA: Diagnosis not present

## 2016-02-19 DIAGNOSIS — O09212 Supervision of pregnancy with history of pre-term labor, second trimester: Secondary | ICD-10-CM

## 2016-02-19 DIAGNOSIS — Z3492 Encounter for supervision of normal pregnancy, unspecified, second trimester: Secondary | ICD-10-CM

## 2016-02-19 DIAGNOSIS — O359XX0 Maternal care for (suspected) fetal abnormality and damage, unspecified, not applicable or unspecified: Secondary | ICD-10-CM

## 2016-02-19 NOTE — Progress Notes (Signed)
Genetic Counseling  High-Risk Gestation Note  Appointment Date:  02/19/2016 Referred By: Nicole Gallegos, CNM Date of Birth:  27-Dec-1985   Pregnancy History: [id number] Estimated Date of Delivery: 07/17/16 Estimated Gestational Age: [redacted]w[redacted]d Attending: Particia Nearing, MD   Nicole Gallegos was seen for genetic counseling because of soft markers for aneuploidy on today's ultrasound.    In summary:  Reviewed ultrasound findings and the association with fetal aneuploidy  Absent nasal bone and echogenic intracardiac focus  Approximate 1 in 71 Down syndrome risk based on patient's age, current gestational age, and markers visualized  Offered additional screening  NIPS-elected to pursue today ([facility] through [place])  Discussed option of diagnostic testing  Amniocentesis - declined  Reviewed family history concerns  Offered fetal echocardiogram given reported history of possible congenital heart disease for father of the pregnancy  [facility].  Ultrasound today revealed an echogenic intracardiac focus (EIF) and absent fetal nasal bone.  The complete ultrasound report will be under separate cover.    We discussed that the second trimester genetic sonogram is targeted at identifying features associated with aneuploidy.  It has evolved as a screening tool used to provide an individualized risk assessment for Down syndrome and other trisomies.  The ability of sonography to aid in the detection of aneuploidies relies on identification of both major structural anomalies and "soft markers."  The patient was counseled that the latter term refers to findings that are often normal variants and do not cause any significant medical problems.  Nonetheless, these markers have a known association with aneuploidy.    The patient was counseled that an EIF is characterized by calcified papillary muscle leading to a discreet dot in the  left, or less commonly, the right ventricle.  We discussed that this finding is typically considered to be a benign variant; however, the risk of aneuploidy is increased when this marker is found in patients who have additional risk factors for fetal aneuploidy (AMA, abnormal screening test, or other markers or anomalies by fetal ultrasound).     Nicole Gallegos was counseled that an absent nasal bone is a highly sensitive and specific marker for Down syndrome.  It is present in approximately 1% of chromosomally normal fetuses, but up to 70% of fetuses with Down syndrome, 55 % with trisomy 18, and 34% with trisomy 13. The presence and length of the fetal nasal bone varies by race and ethnicity in second trimester fetuses.  The associated risk of aneuploidy is highest in Caucasians, with a likelihood ratio of approximately 50, in African Americans the likelihood ratio is approximately 8.    Given Nicole Gallegos' maternal age of 30 y.o., we discussed the approximate 1 in 720 risk for fetal Down syndrome.  Considering the ultrasound findings of an absent nasal bone and EIF, the adjusted risk for fetal Down syndrome is approximately 1 in 72 (1.4%).   We reviewed chromosomes, nondisjunction, and the common features and variable prognosis of Down syndrome.  We also reviewed other aneuploidies including trisomies 13 and 35; however, we discussed that these conditions are less likely given that the remainder of the fetal anatomy was within normal limits by ultrasound.  We reviewed other available screening and diagnostic options including noninvasive prenatal screening (NIPS)/cell free DNA (cfDNA) screening, and amniocentesis.  She was counseled regarding the benefits and limitations of each option.  We reviewed the approximate 1 in 300-500 risk  for complications for amniocentesis, including spontaneous pregnancy loss. We discussed the possible results that the tests might provide including: positive, negative,  unanticipated, and no result. Finally, they were counseled regarding the cost of each option and potential out of pocket expenses. After consideration of all the options, she elected to proceed with NIPS ([facility] through [facility]) today.  Those results will be available in 8-10 days.  She declined amniocentesis. Follow-up ultrasound was scheduled for 04/01/16.   Both family histories were reviewed and found to be contributory for a female paternal first cousin once removed to the patient with Down syndrome (unspecified type). No additional relatives with Down syndrome and no relatives with recurrent miscarriage were reported. We discussed that 95% of cases of Down syndrome are not inherited and are the result of non-disjunction.  Three to 4% of cases of Down syndrome are the result of a translocation involving chromosome #21.  We discussed the option of chromosome analysis to determine if an individual is a carrier of a balanced translocation involving chromosome #21.  If an individual carries a balanced translocation involving chromosome #21, then the chance to have a baby with Down syndrome would be greater than the maternal age-related risk.  The reported family history is most consistent with sporadic Down syndrome. Additional information regarding this relative's karyotype may alter recurrence risk assessment for relatives.   Nicole Gallegos reported that the father of the pregnancy had open heart surgery at age 29 years old. She had limited information regarding the specific underlying condition. Additionally, Nicole Gallegos reported her paternal half-sister's daughter was born with a congenital heart defect. She has had two corrective surgeries, and the third will occur at approximately 644-535 years of age. These individuals are reportedly otherwise healthy. Congenital heart defects occur in approximately 1% of pregnancies.  Congenital heart defects may occur due to multifactorial influences, chromosomal  abnormalities, genetic syndromes or environmental exposures.  Isolated heart defects are generally multifactorial.  Given the reported family history and assuming multifactorial inheritance, the risk for a congenital heart defect in the current pregnancy would be approximately 2-3%. We discussed the options of detailed ultrasound and fetal echocardiogram in pregnancy to assess fetal heart in detail. She understands that ultrasound cannot diagnose or rule out all birth defects, including all heart defects, prenatally. She elected to pursue fetal echocardiogram, which [facility]. Without further information regarding the provided family history, an accurate genetic risk cannot be calculated. Further genetic counseling is warranted if more information is obtained.  Ms. Mickie KayDeirdre D Iyer denied exposure to environmental toxins or chemical agents. She denied the use of alcohol, tobacco or street drugs. She denied significant viral illnesses during the course of her pregnancy. Her medical and surgical histories were noncontributory.   I counseled Ms. Tracye D Borneman regarding the above risks and available options.  The approximate face-to-face time with the genetic counselor was 35 minutes.    Quinn PlowmanKaren Asianae Minkler, MS Certified Genetic Counselor  02/19/2016

## 2016-02-23 ENCOUNTER — Ambulatory Visit (INDEPENDENT_AMBULATORY_CARE_PROVIDER_SITE_OTHER): Payer: Medicaid Other

## 2016-02-23 VITALS — BP 111/74 | HR 84

## 2016-02-23 DIAGNOSIS — O283 Abnormal ultrasonic finding on antenatal screening of mother: Secondary | ICD-10-CM | POA: Insufficient documentation

## 2016-02-23 DIAGNOSIS — O09212 Supervision of pregnancy with history of pre-term labor, second trimester: Secondary | ICD-10-CM | POA: Diagnosis present

## 2016-02-23 MED ORDER — HYDROXYPROGESTERONE CAPROATE 250 MG/ML IM OIL
250.0000 mg | TOPICAL_OIL | Freq: Once | INTRAMUSCULAR | Status: AC
  Administered 2016-02-23: 250 mg via INTRAMUSCULAR

## 2016-02-25 ENCOUNTER — Other Ambulatory Visit (HOSPITAL_COMMUNITY): Payer: Self-pay

## 2016-02-25 ENCOUNTER — Telehealth (HOSPITAL_COMMUNITY): Payer: Self-pay | Admitting: MS"

## 2016-02-25 NOTE — Telephone Encounter (Signed)
Attempted to call patient regarding results of prenatal cell free DNA testing, which were within normal range. Left message for patient to return call.   Clydie BraunKaren Athalia Setterlund  02/25/2016 9:52 AM

## 2016-02-25 NOTE — Telephone Encounter (Signed)
Called Cledith D Maden to discuss her prenatal cell free DNA test results.  Mrs. Mickie Kayeirdre D Ghrist had Panorama testing through [facility].  Testing was offered because of ultrasound findings.   The patient was identified by name and DOB.  We reviewed that these are within normal limits, showing a less than 1 in 10,000 risk for trisomies 21, 18 and 13, and monosomy X (Turner syndrome).  In addition, the risk for triploidy/vanishing twin and sex chromosome trisomies (47,XXX and 47,XXY) was also low risk. We reviewed that this testing identifies > 99% of pregnancies with trisomy 4221, trisomy 9313, sex chromosome trisomies (47,XXX and 47,XXY), and triploidy. The detection rate for trisomy 18 is 96%.  The detection rate for monosomy X is ~92%.  The false positive rate is <0.1% for all conditions. Testing was also consistent with female fetal sex. She understands that this testing does not identify all genetic conditions.  All questions were answered to her satisfaction, she was encouraged to call with additional questions or concerns.  Quinn PlowmanKaren Kalinda Romaniello, MS Certified Genetic Counselor 02/25/2016 11:03 AM

## 2016-03-01 ENCOUNTER — Ambulatory Visit (INDEPENDENT_AMBULATORY_CARE_PROVIDER_SITE_OTHER): Payer: Medicaid Other | Admitting: Advanced Practice Midwife

## 2016-03-01 ENCOUNTER — Ambulatory Visit (INDEPENDENT_AMBULATORY_CARE_PROVIDER_SITE_OTHER): Payer: Medicaid Other | Admitting: General Practice

## 2016-03-01 VITALS — BP 103/69 | HR 80 | Wt 137.0 lb

## 2016-03-01 VITALS — BP 103/69 | HR 99 | Ht 68.0 in | Wt 137.0 lb

## 2016-03-01 DIAGNOSIS — O09212 Supervision of pregnancy with history of pre-term labor, second trimester: Secondary | ICD-10-CM | POA: Diagnosis present

## 2016-03-01 DIAGNOSIS — O283 Abnormal ultrasonic finding on antenatal screening of mother: Secondary | ICD-10-CM

## 2016-03-01 LAB — POCT URINALYSIS DIP (DEVICE)
Bilirubin Urine: NEGATIVE
GLUCOSE, UA: NEGATIVE mg/dL
Hgb urine dipstick: NEGATIVE
KETONES UR: NEGATIVE mg/dL
Nitrite: NEGATIVE
PROTEIN: NEGATIVE mg/dL
Specific Gravity, Urine: 1.01 (ref 1.005–1.030)
UROBILINOGEN UA: 1 mg/dL (ref 0.0–1.0)
pH: 7 (ref 5.0–8.0)

## 2016-03-01 MED ORDER — HYDROXYPROGESTERONE CAPROATE 250 MG/ML IM OIL
250.0000 mg | TOPICAL_OIL | Freq: Once | INTRAMUSCULAR | Status: AC
  Administered 2016-03-01: 250 mg via INTRAMUSCULAR

## 2016-03-01 NOTE — Patient Instructions (Signed)

## 2016-03-01 NOTE — Progress Notes (Signed)
Subjective:  Nicole Gallegos is a 30 y.o. [id number] at 2128w2d being seen today for ongoing prenatal care.  She is currently monitored for the following issues for this high-risk pregnancy and has Anxiety and depression; History of blood transfusion; Asthma, chronic; Panic attacks; Seizures (HCC); Prior pregnancy with fetal demise at 16 weeks; Tension headache, chronic; Migraine; Supervision of high risk pregnancy, antepartum; High risk pregnancy due to history of preterm labor, antepartum; Depression affecting pregnancy, antepartum; Echogenic intracardiac focus of fetus on prenatal ultrasound; and Abnormal fetal ultrasound on her problem list.  Patient reports no complaints.  Contractions: Not present. Vag. Bleeding: None.  Movement: Present. Denies leaking of fluid.   The following portions of the patient's history were reviewed and updated as appropriate: allergies, current medications, past family history, past medical history, past social history, past surgical history and problem list. Problem list updated.  Objective:   Vitals:   03/01/16 0834  BP: 103/69  Pulse: 80  Weight: 137 lb (62.1 kg)    Fetal Status: Fetal Heart Rate (bpm): 145   Movement: Present     General:  Alert, oriented and cooperative. Patient is in no acute distress.  Skin: Skin is warm and dry. No rash noted.   Cardiovascular: Normal heart rate noted  Respiratory: Normal respiratory effort, no problems with respiration noted  Abdomen: Soft, gravid, appropriate for gestational age. Pain/Pressure: Present     Pelvic:  Cervical exam deferred        Extremities: Normal range of motion.  Edema: None  Mental Status: Normal mood and affect. Normal behavior. Normal judgment and thought content.   Urinalysis:      Assessment and Plan:  Pregnancy: [id number] at 4228w2d  1. High risk pregnancy due to history of preterm labor in second trimester --Weekly 17-P, given today  2. Abnormal fetal ultrasound --Absent nasal bone,  normal NIPS.  Pt reported calcification area in heart noted on anatomy US but no mention of cardiac abnormality on final report.  Pt and her husband have strong family history of cardiac abnormalities so are concerned about this for baby.  Reviewed US results with pt and s/o today.  Pt has fetal ECHO and follow up to complete anatomy with MFM scheduled.  Preterm labor symptoms and general obstetric precautions including but not limited to vaginal bleeding, contractions, leaking of fluid and fetal movement were reviewed in detail with the patient. Please refer to After Visit Summary for other counseling recommendations.  Return in about 4 weeks (around 03/29/2016).   Hurshel PartyLisa A Leftwich-Kirby, CNM

## 2016-03-02 ENCOUNTER — Encounter: Payer: Self-pay | Admitting: Advanced Practice Midwife

## 2016-03-08 ENCOUNTER — Ambulatory Visit (INDEPENDENT_AMBULATORY_CARE_PROVIDER_SITE_OTHER): Payer: Medicaid Other | Admitting: *Deleted

## 2016-03-08 DIAGNOSIS — O09212 Supervision of pregnancy with history of pre-term labor, second trimester: Secondary | ICD-10-CM | POA: Diagnosis present

## 2016-03-08 MED ORDER — HYDROXYPROGESTERONE CAPROATE 250 MG/ML IM OIL
250.0000 mg | TOPICAL_OIL | Freq: Once | INTRAMUSCULAR | Status: AC
  Administered 2016-03-08: 250 mg via INTRAMUSCULAR

## 2016-03-15 ENCOUNTER — Ambulatory Visit: Payer: Medicaid Other

## 2016-03-15 VITALS — BP 106/72 | HR 69

## 2016-03-15 DIAGNOSIS — O09892 Supervision of other high risk pregnancies, second trimester: Secondary | ICD-10-CM

## 2016-03-15 MED ORDER — HYDROXYPROGESTERONE CAPROATE 250 MG/ML IM OIL
250.0000 mg | TOPICAL_OIL | Freq: Once | INTRAMUSCULAR | Status: AC
  Administered 2016-03-15: 250 mg via INTRAMUSCULAR

## 2016-03-15 NOTE — Progress Notes (Signed)
Patient presented to clinic today for her 17-p injections. Patient tolerated well and will follow up next week for next injection.

## 2016-03-22 ENCOUNTER — Ambulatory Visit (INDEPENDENT_AMBULATORY_CARE_PROVIDER_SITE_OTHER): Payer: Medicaid Other

## 2016-03-22 VITALS — BP 100/65 | HR 106

## 2016-03-22 DIAGNOSIS — O09212 Supervision of pregnancy with history of pre-term labor, second trimester: Secondary | ICD-10-CM | POA: Diagnosis present

## 2016-03-22 MED ORDER — HYDROXYPROGESTERONE CAPROATE 250 MG/ML IM OIL
250.0000 mg | TOPICAL_OIL | Freq: Once | INTRAMUSCULAR | Status: AC
Start: 2016-03-22 — End: 2016-03-22
  Administered 2016-03-22: 250 mg via INTRAMUSCULAR

## 2016-03-22 NOTE — Progress Notes (Signed)
Patient presented to office today for her 17-p injection. Patient tolerated well and will follow up at her next schedule office visit.

## 2016-03-28 ENCOUNTER — Encounter (HOSPITAL_COMMUNITY): Payer: Self-pay

## 2016-03-31 ENCOUNTER — Encounter: Payer: Self-pay | Admitting: Obstetrics and Gynecology

## 2016-03-31 ENCOUNTER — Ambulatory Visit (INDEPENDENT_AMBULATORY_CARE_PROVIDER_SITE_OTHER): Payer: Medicaid Other | Admitting: Obstetrics and Gynecology

## 2016-03-31 VITALS — BP 105/59 | HR 91 | Wt 142.1 lb

## 2016-03-31 DIAGNOSIS — F419 Anxiety disorder, unspecified: Secondary | ICD-10-CM

## 2016-03-31 DIAGNOSIS — O09212 Supervision of pregnancy with history of pre-term labor, second trimester: Secondary | ICD-10-CM | POA: Diagnosis not present

## 2016-03-31 DIAGNOSIS — O283 Abnormal ultrasonic finding on antenatal screening of mother: Secondary | ICD-10-CM

## 2016-03-31 DIAGNOSIS — O0992 Supervision of high risk pregnancy, unspecified, second trimester: Secondary | ICD-10-CM

## 2016-03-31 DIAGNOSIS — F32A Depression, unspecified: Secondary | ICD-10-CM

## 2016-03-31 DIAGNOSIS — R569 Unspecified convulsions: Secondary | ICD-10-CM

## 2016-03-31 DIAGNOSIS — F329 Major depressive disorder, single episode, unspecified: Secondary | ICD-10-CM

## 2016-03-31 DIAGNOSIS — O09299 Supervision of pregnancy with other poor reproductive or obstetric history, unspecified trimester: Secondary | ICD-10-CM

## 2016-03-31 DIAGNOSIS — O09292 Supervision of pregnancy with other poor reproductive or obstetric history, second trimester: Secondary | ICD-10-CM | POA: Diagnosis present

## 2016-03-31 DIAGNOSIS — Z23 Encounter for immunization: Secondary | ICD-10-CM | POA: Diagnosis not present

## 2016-03-31 LAB — POCT URINALYSIS DIP (DEVICE)
Bilirubin Urine: NEGATIVE
GLUCOSE, UA: NEGATIVE mg/dL
HGB URINE DIPSTICK: NEGATIVE
KETONES UR: NEGATIVE mg/dL
Nitrite: NEGATIVE
PROTEIN: NEGATIVE mg/dL
SPECIFIC GRAVITY, URINE: 1.015 (ref 1.005–1.030)
Urobilinogen, UA: 1 mg/dL (ref 0.0–1.0)
pH: 7.5 (ref 5.0–8.0)

## 2016-03-31 MED ORDER — HYDROXYPROGESTERONE CAPROATE 250 MG/ML IM OIL
250.0000 mg | TOPICAL_OIL | Freq: Once | INTRAMUSCULAR | Status: AC
  Administered 2016-03-31: 250 mg via INTRAMUSCULAR

## 2016-03-31 NOTE — Progress Notes (Signed)
Flu vaccine today 

## 2016-03-31 NOTE — Addendum Note (Signed)
Addended by: Garret ReddishBARNES, Nashiya Disbrow M on: 03/31/2016 09:11 AM   Modules accepted: Orders

## 2016-03-31 NOTE — Progress Notes (Signed)
17-p today 

## 2016-03-31 NOTE — Progress Notes (Signed)
Prenatal Visit Note Date: 03/31/2016 [facility]: [facility]  Subjective:  Nicole Gallegos is a 30 y.o. [id number] at 7642w4d being seen today for ongoing prenatal care.  She is currently monitored for the following issues for this high-risk pregnancy and has Anxiety and depression; History of blood transfusion; Asthma, chronic; Panic attacks; Seizures (HCC); Prior pregnancy with fetal demise at 16 weeks; Tension headache, chronic; Migraine; Supervision of high risk pregnancy, antepartum; High risk pregnancy due to history of preterm labor, antepartum; Depression affecting pregnancy, antepartum; Echogenic intracardiac focus of fetus on prenatal ultrasound; and Abnormal fetal ultrasound on her problem list.  Patient reports no complaints.   Contractions: Not present. Vag. Bleeding: None.  Movement: Present. Denies leaking of fluid.   The following portions of the patient's history were reviewed and updated as appropriate: allergies, current medications, past family history, past medical history, past social history, past surgical history and problem list. Problem list updated.  Objective:   Vitals:   03/31/16 0758  BP: (!) 105/59  Pulse: 91  Weight: 142 lb 1.6 oz (64.5 kg)    Fetal Status: Fetal Heart Rate (bpm): 153   Movement: Present     General:  Alert, oriented and cooperative. Patient is in no acute distress.  Skin: Skin is warm and dry. No rash noted.   Cardiovascular: Normal heart rate noted  Respiratory: Normal respiratory effort, no problems with respiration noted  Abdomen: Soft, gravid, appropriate for gestational age. Pain/Pressure: Present     Pelvic:  Cervical exam deferred        Extremities: Normal range of motion.  Edema: None  Mental Status: Normal mood and affect. Normal behavior. Normal judgment and thought content.   Urinalysis:      Assessment and Plan:  Pregnancy: [id number] at 3342w4d  1. Needs flu shot - Flu Vaccine QUAD 36+ mos IM (Fluarix, Quad  PF)  2. Supervision of high risk pregnancy, antepartum, second trimester and h/o PTB (36wks Routine care. 17p today and continue qwk until 36wks. 28wk labs nv  3. Seizures (HCC) On no meds and no issues.  4. Prior pregnancy with fetal demise at 16 weeks No issues  5. Echogenic intracardiac focus of fetus on prenatal ultrasound S/p normal 9/14 peds cards echo except for EIF seen. No f/u or post natal f/u recommended Seen by MFM GC already Negative panorama last month Has f/u u/s with MFM tomorrow for completion anatomy scan, growth  6. Anxiety and depression On no meds. D/w her EIF findings and current plan of care  Preterm labor symptoms and general obstetric precautions including but not limited to vaginal bleeding, contractions, leaking of fluid and fetal movement were reviewed in detail with the patient. Please refer to After Visit Summary for other counseling recommendations.  Return in about 3 weeks (around 04/21/2016).   Nicole Gallegos Nicole Werden, MD

## 2016-04-01 ENCOUNTER — Ambulatory Visit (HOSPITAL_COMMUNITY)
Admission: RE | Admit: 2016-04-01 | Discharge: 2016-04-01 | Disposition: A | Discharge status: Home / Self Care | Payer: Medicaid Other | Source: Ambulatory Visit | Attending: Obstetrics and Gynecology | Admitting: Obstetrics and Gynecology

## 2016-04-01 ENCOUNTER — Encounter (HOSPITAL_COMMUNITY): Payer: Self-pay

## 2016-04-01 ENCOUNTER — Other Ambulatory Visit (HOSPITAL_COMMUNITY): Payer: Self-pay | Admitting: *Deleted

## 2016-04-01 DIAGNOSIS — O283 Abnormal ultrasonic finding on antenatal screening of mother: Secondary | ICD-10-CM

## 2016-04-01 DIAGNOSIS — O09212 Supervision of pregnancy with history of pre-term labor, second trimester: Secondary | ICD-10-CM | POA: Diagnosis not present

## 2016-04-01 DIAGNOSIS — Z3A24 24 weeks gestation of pregnancy: Secondary | ICD-10-CM | POA: Insufficient documentation

## 2016-04-01 DIAGNOSIS — O358XX0 Maternal care for other (suspected) fetal abnormality and damage, not applicable or unspecified: Secondary | ICD-10-CM | POA: Insufficient documentation

## 2016-04-07 ENCOUNTER — Ambulatory Visit: Payer: Self-pay

## 2016-04-14 ENCOUNTER — Ambulatory Visit: Payer: Self-pay

## 2016-04-21 ENCOUNTER — Ambulatory Visit (INDEPENDENT_AMBULATORY_CARE_PROVIDER_SITE_OTHER): Payer: Medicaid Other | Admitting: Obstetrics and Gynecology

## 2016-04-21 VITALS — BP 104/69 | HR 79 | Wt 144.0 lb

## 2016-04-21 DIAGNOSIS — O09292 Supervision of pregnancy with other poor reproductive or obstetric history, second trimester: Secondary | ICD-10-CM

## 2016-04-21 DIAGNOSIS — O09212 Supervision of pregnancy with history of pre-term labor, second trimester: Secondary | ICD-10-CM | POA: Diagnosis not present

## 2016-04-21 DIAGNOSIS — O0990 Supervision of high risk pregnancy, unspecified, unspecified trimester: Secondary | ICD-10-CM

## 2016-04-21 DIAGNOSIS — O99340 Other mental disorders complicating pregnancy, unspecified trimester: Secondary | ICD-10-CM

## 2016-04-21 DIAGNOSIS — J4520 Mild intermittent asthma, uncomplicated: Secondary | ICD-10-CM | POA: Diagnosis not present

## 2016-04-21 DIAGNOSIS — O09219 Supervision of pregnancy with history of pre-term labor, unspecified trimester: Secondary | ICD-10-CM

## 2016-04-21 DIAGNOSIS — F329 Major depressive disorder, single episode, unspecified: Secondary | ICD-10-CM

## 2016-04-21 DIAGNOSIS — O99342 Other mental disorders complicating pregnancy, second trimester: Secondary | ICD-10-CM | POA: Diagnosis not present

## 2016-04-21 DIAGNOSIS — R569 Unspecified convulsions: Secondary | ICD-10-CM

## 2016-04-21 DIAGNOSIS — O283 Abnormal ultrasonic finding on antenatal screening of mother: Secondary | ICD-10-CM

## 2016-04-21 DIAGNOSIS — O99512 Diseases of the respiratory system complicating pregnancy, second trimester: Secondary | ICD-10-CM | POA: Diagnosis not present

## 2016-04-21 DIAGNOSIS — O09299 Supervision of pregnancy with other poor reproductive or obstetric history, unspecified trimester: Secondary | ICD-10-CM

## 2016-04-21 DIAGNOSIS — O0993 Supervision of high risk pregnancy, unspecified, third trimester: Secondary | ICD-10-CM

## 2016-04-21 LAB — CBC
HCT: 35.4 % (ref 35.0–45.0)
Hemoglobin: 11.9 g/dL (ref 11.7–15.5)
MCH: 29.6 pg (ref 27.0–33.0)
MCHC: 33.6 g/dL (ref 32.0–36.0)
MCV: 88.1 fL (ref 80.0–100.0)
MPV: 10.3 fL (ref 7.5–12.5)
PLATELETS: 196 10*3/uL (ref 140–400)
RBC: 4.02 MIL/uL (ref 3.80–5.10)
RDW: 13.4 % (ref 11.0–15.0)
WBC: 6.4 10*3/uL (ref 3.8–10.8)

## 2016-04-21 MED ORDER — HYDROXYPROGESTERONE CAPROATE 250 MG/ML IM OIL
250.0000 mg | TOPICAL_OIL | Freq: Once | INTRAMUSCULAR | Status: AC
  Administered 2016-04-21: 250 mg via INTRAMUSCULAR

## 2016-04-21 MED ORDER — TETANUS-DIPHTH-ACELL PERTUSSIS 5-2.5-18.5 LF-MCG/0.5 IM SUSP
0.5000 mL | Freq: Once | INTRAMUSCULAR | Status: AC
  Administered 2016-04-21: 0.5 mL via INTRAMUSCULAR

## 2016-04-21 MED ORDER — BUDESONIDE 180 MCG/ACT IN AEPB: 2.0000 | INHALATION_SPRAY | Freq: Two times a day (BID) | RESPIRATORY_TRACT | Status: DC

## 2016-04-21 NOTE — Progress Notes (Signed)
Subjective:  Nicole Gallegos is a 30 y.o. [id number] at 1953w4d being seen today for ongoing prenatal care.  She is currently monitored for the following issues for this high-risk pregnancy and has Anxiety and depression; History of blood transfusion; Asthma, chronic; Panic attacks; Seizures (HCC); Prior pregnancy with fetal demise at 16 weeks; Tension headache, chronic; Migraine; Supervision of high risk pregnancy, antepartum; High risk pregnancy due to history of preterm labor, antepartum; Depression affecting pregnancy, antepartum; Echogenic intracardiac focus of fetus on prenatal ultrasound; and Abnormal fetal ultrasound on her problem list.  Patient reports Increased use of her Albuterol MDI. Using 3-4 times a day. Feels SOB at times ,mostly at night. Albuterol helps. .  Contractions: Not present. Vag. Bleeding: None.  Movement: Present. Denies leaking of fluid.   The following portions of the patient's history were reviewed and updated as appropriate: allergies, current medications, past family history, past medical history, past social history, past surgical history and problem list. Problem list updated.  Objective:   Vitals:   04/21/16 0807  BP: 104/69  Pulse: 79  Weight: 144 lb (65.3 kg)    Fetal Status: Fetal Heart Rate (bpm): 155 Fundal Height: 27 cm Movement: Present     General:  Alert, oriented and cooperative. Patient is in no acute distress.  Skin: Skin is warm and dry. No rash noted.   Cardiovascular: Normal heart rate noted  Respiratory: Normal respiratory effort, no problems with respiration noted, clear, no wheezing noted  Abdomen: Soft, gravid, appropriate for gestational age. Pain/Pressure: Present     Pelvic:  Cervical exam deferred        Extremities: Normal range of motion.  Edema: None  Mental Status: Normal mood and affect. Normal behavior. Normal judgment and thought content.   Urinalysis:      Assessment and Plan:  Pregnancy: [id number] at 353w4d  1. Pregnancy,  supervision, high-risk, third trimester Tdap vaccine today - Glucose Tolerance, 1 HR (50g) w/o Fasting - RPR - HIV antibody (with reflex) - CBC  2. Echogenic intracardiac focus of fetus on prenatal ultrasound Full work up has been completed. No additional work up necessary.  3. Prior pregnancy with fetal demise at 16 weeks   4. Supervision of high risk pregnancy, antepartum   5. High risk pregnancy due to history of preterm labor, antepartum Stable. Continue with weekly 17OHP  6. Depression affecting pregnancy, antepartum No meds, stable  7. Seizures (HCC) No meds, stable  8. Asthma  Will add Pulmicort to her Albuterol MDI.  Preterm labor symptoms and general obstetric precautions including but not limited to vaginal bleeding, contractions, leaking of fluid and fetal movement were reviewed in detail with the patient. Please refer to After Visit Summary for other counseling recommendations.  Return in about 2 weeks (around 05/05/2016) for OB visit.   Hermina StaggersMichael L Krissie Merrick, MD

## 2016-04-21 NOTE — Addendum Note (Signed)
Addended by: Faythe CasaBELLAMY, Roran Wegner M on: 04/21/2016 09:19 AM   Modules accepted: Orders

## 2016-04-21 NOTE — Progress Notes (Signed)
28 wk labs today; glucola due @ 0907 28 wk packet given  tdap vaccine given

## 2016-04-22 LAB — HIV ANTIBODY (ROUTINE TESTING W REFLEX): HIV: NONREACTIVE

## 2016-04-22 LAB — RPR

## 2016-04-22 LAB — GLUCOSE TOLERANCE, 1 HOUR (50G) W/O FASTING: Glucose, 1 Hr, gestational: 77 mg/dL (ref <140)

## 2016-04-28 ENCOUNTER — Ambulatory Visit: Payer: Self-pay

## 2016-04-29 ENCOUNTER — Other Ambulatory Visit (HOSPITAL_COMMUNITY): Payer: Self-pay | Admitting: Obstetrics and Gynecology

## 2016-04-29 ENCOUNTER — Ambulatory Visit (HOSPITAL_COMMUNITY)
Admission: RE | Admit: 2016-04-29 | Discharge: 2016-04-29 | Disposition: A | Discharge status: Home / Self Care | Payer: Medicaid Other | Source: Ambulatory Visit | Attending: Obstetrics and Gynecology | Admitting: Obstetrics and Gynecology

## 2016-04-29 ENCOUNTER — Ambulatory Visit: Payer: Self-pay

## 2016-04-29 ENCOUNTER — Encounter (HOSPITAL_COMMUNITY): Payer: Self-pay

## 2016-04-29 VITALS — BP 108/74 | HR 97 | Wt 146.8 lb

## 2016-04-29 DIAGNOSIS — O09213 Supervision of pregnancy with history of pre-term labor, third trimester: Secondary | ICD-10-CM

## 2016-04-29 DIAGNOSIS — Z3A28 28 weeks gestation of pregnancy: Secondary | ICD-10-CM

## 2016-04-29 DIAGNOSIS — O358XX0 Maternal care for other (suspected) fetal abnormality and damage, not applicable or unspecified: Secondary | ICD-10-CM | POA: Insufficient documentation

## 2016-04-29 DIAGNOSIS — O283 Abnormal ultrasonic finding on antenatal screening of mother: Secondary | ICD-10-CM

## 2016-05-04 ENCOUNTER — Telehealth: Payer: Self-pay | Admitting: *Deleted

## 2016-05-04 MED ORDER — BUDESONIDE 180 MCG/ACT IN AEPB: 2.0000 | INHALATION_SPRAY | Freq: Two times a day (BID) | RESPIRATORY_TRACT | 0 refills | Status: DC

## 2016-05-04 NOTE — Telephone Encounter (Signed)
Pt left message stating that she was expecting a prescription for a different inhaler to be sent to her pharmacy but they do not have it.  Per chart review, Rx was not ordered properly. Pulmacort inhaler was e-prescribed per note form Dr. Alysia PennaErvin on 04/21/16. I called pt and left message on her voice mail that a new Rx has been sent to her pharmacy. She should contact our office if there are any further questions or problems with this medication.

## 2016-05-05 ENCOUNTER — Ambulatory Visit: Payer: Self-pay

## 2016-05-12 ENCOUNTER — Ambulatory Visit (INDEPENDENT_AMBULATORY_CARE_PROVIDER_SITE_OTHER): Payer: Medicaid Other | Admitting: Family Medicine

## 2016-05-12 VITALS — BP 100/65 | HR 101 | Wt 145.0 lb

## 2016-05-12 DIAGNOSIS — O09299 Supervision of pregnancy with other poor reproductive or obstetric history, unspecified trimester: Secondary | ICD-10-CM

## 2016-05-12 DIAGNOSIS — O09213 Supervision of pregnancy with history of pre-term labor, third trimester: Secondary | ICD-10-CM

## 2016-05-12 DIAGNOSIS — O09293 Supervision of pregnancy with other poor reproductive or obstetric history, third trimester: Secondary | ICD-10-CM

## 2016-05-12 DIAGNOSIS — J4520 Mild intermittent asthma, uncomplicated: Secondary | ICD-10-CM

## 2016-05-12 DIAGNOSIS — O0990 Supervision of high risk pregnancy, unspecified, unspecified trimester: Secondary | ICD-10-CM

## 2016-05-12 DIAGNOSIS — O09219 Supervision of pregnancy with history of pre-term labor, unspecified trimester: Secondary | ICD-10-CM

## 2016-05-12 LAB — POCT URINALYSIS DIP (DEVICE)
BILIRUBIN URINE: NEGATIVE
Glucose, UA: NEGATIVE mg/dL
HGB URINE DIPSTICK: NEGATIVE
KETONES UR: NEGATIVE mg/dL
NITRITE: NEGATIVE
Protein, ur: NEGATIVE mg/dL
SPECIFIC GRAVITY, URINE: 1.015 (ref 1.005–1.030)
Urobilinogen, UA: 2 mg/dL — ABNORMAL HIGH (ref 0.0–1.0)
pH: 7 (ref 5.0–8.0)

## 2016-05-12 MED ORDER — HYDROXYPROGESTERONE CAPROATE 250 MG/ML IM OIL
250.0000 mg | TOPICAL_OIL | Freq: Once | INTRAMUSCULAR | Status: AC
  Administered 2016-05-12: 250 mg via INTRAMUSCULAR

## 2016-05-12 MED ORDER — BUDESONIDE 180 MCG/ACT IN AEPB: 2.0000 | INHALATION_SPRAY | Freq: Two times a day (BID) | RESPIRATORY_TRACT | 2 refills | Status: DC

## 2016-05-12 NOTE — Patient Instructions (Signed)
Third Trimester of Pregnancy The third trimester is from week 29 through week 42, months 7 through 9. The third trimester is a time when the fetus is growing rapidly. At the end of the ninth month, the fetus is about 20 inches in length and weighs 6-10 pounds.  BODY CHANGES Your body goes through many changes during pregnancy. The changes vary from woman to woman.   Your weight will continue to increase. You can expect to gain 25-35 pounds (11-16 kg) by the end of the pregnancy.  You may begin to get stretch marks on your hips, abdomen, and breasts.  You may urinate more often because the fetus is moving lower into your pelvis and pressing on your bladder.  You may develop or continue to have heartburn as a result of your pregnancy.  You may develop constipation because certain hormones are causing the muscles that push waste through your intestines to slow down.  You may develop hemorrhoids or swollen, bulging veins (varicose veins).  You may have pelvic pain because of the weight gain and pregnancy hormones relaxing your joints between the bones in your pelvis. Backaches may result from overexertion of the muscles supporting your posture.  You may have changes in your hair. These can include thickening of your hair, rapid growth, and changes in texture. Some women also have hair loss during or after pregnancy, or hair that feels dry or thin. Your hair will most likely return to normal after your baby is born.  Your breasts will continue to grow and be tender. A yellow discharge may leak from your breasts called colostrum.  Your belly button may stick out.  You may feel short of breath because of your expanding uterus.  You may notice the fetus "dropping," or moving lower in your abdomen.  You may have a bloody mucus discharge. This usually occurs a few days to a week before labor begins.  Your cervix becomes thin and soft (effaced) near your due date. WHAT TO EXPECT AT YOUR  PRENATAL EXAMS  You will have prenatal exams every 2 weeks until week 36. Then, you will have weekly prenatal exams. During a routine prenatal visit:  You will be weighed to make sure you and the fetus are growing normally.  Your blood pressure is taken.  Your abdomen will be measured to track your baby's growth.  The fetal heartbeat will be listened to.  Any test results from the previous visit will be discussed.  You may have a cervical check near your due date to see if you have effaced. At around 36 weeks, your caregiver will check your cervix. At the same time, your caregiver will also perform a test on the secretions of the vaginal tissue. This test is to determine if a type of bacteria, Group B streptococcus, is present. Your caregiver will explain this further. Your caregiver may ask you:  What your birth plan is.  How you are feeling.  If you are feeling the baby move.  If you have had any abnormal symptoms, such as leaking fluid, bleeding, severe headaches, or abdominal cramping.  If you are using any tobacco products, including cigarettes, chewing tobacco, and electronic cigarettes.  If you have any questions. Other tests or screenings that may be performed during your third trimester include:  Blood tests that check for low iron levels (anemia).  Fetal testing to check the health, activity level, and growth of the fetus. Testing is done if you have certain medical conditions or if   there are problems during the pregnancy.  HIV (human immunodeficiency virus) testing. If you are at high risk, you may be screened for HIV during your third trimester of pregnancy. FALSE LABOR You may feel small, irregular contractions that eventually go away. These are called Braxton Hicks contractions, or false labor. Contractions may last for hours, days, or even weeks before true labor sets in. If contractions come at regular intervals, intensify, or become painful, it is best to be seen  by your caregiver.  SIGNS OF LABOR   Menstrual-like cramps.  Contractions that are 5 minutes apart or less.  Contractions that start on the top of the uterus and spread down to the lower abdomen and back.  A sense of increased pelvic pressure or back pain.  A watery or bloody mucus discharge that comes from the vagina. If you have any of these signs before the 37th week of pregnancy, call your caregiver right away. You need to go to the hospital to get checked immediately. HOME CARE INSTRUCTIONS   Avoid all smoking, herbs, alcohol, and unprescribed drugs. These chemicals affect the formation and growth of the baby.  Do not use any tobacco products, including cigarettes, chewing tobacco, and electronic cigarettes. If you need help quitting, ask your health care provider. You may receive counseling support and other resources to help you quit.  Follow your caregiver's instructions regarding medicine use. There are medicines that are either safe or unsafe to take during pregnancy.  Exercise only as directed by your caregiver. Experiencing uterine cramps is a good sign to stop exercising.  Continue to eat regular, healthy meals.  Wear a good support bra for breast tenderness.  Do not use hot tubs, steam rooms, or saunas.  Wear your seat belt at all times when driving.  Avoid raw meat, uncooked cheese, cat litter boxes, and soil used by cats. These carry germs that can cause birth defects in the baby.  Take your prenatal vitamins.  Take 1500-2000 mg of calcium daily starting at the 20th week of pregnancy until you deliver your baby.  Try taking a stool softener (if your caregiver approves) if you develop constipation. Eat more high-fiber foods, such as fresh vegetables or fruit and whole grains. Drink plenty of fluids to keep your urine clear or pale yellow.  Take warm sitz baths to soothe any pain or discomfort caused by hemorrhoids. Use hemorrhoid cream if your caregiver  approves.  If you develop varicose veins, wear support hose. Elevate your feet for 15 minutes, 3-4 times a day. Limit salt in your diet.  Avoid heavy lifting, wear low heal shoes, and practice good posture.  Rest a lot with your legs elevated if you have leg cramps or low back pain.  Visit your dentist if you have not gone during your pregnancy. Use a soft toothbrush to brush your teeth and be gentle when you floss.  A sexual relationship may be continued unless your caregiver directs you otherwise.  Do not travel far distances unless it is absolutely necessary and only with the approval of your caregiver.  Take prenatal classes to understand, practice, and ask questions about the labor and delivery.  Make a trial run to the hospital.  Pack your hospital bag.  Prepare the baby's nursery.  Continue to go to all your prenatal visits as directed by your caregiver. SEEK MEDICAL CARE IF:  You are unsure if you are in labor or if your water has broken.  You have dizziness.  You have   mild pelvic cramps, pelvic pressure, or nagging pain in your abdominal area.  You have persistent nausea, vomiting, or diarrhea.  You have a bad smelling vaginal discharge.  You have pain with urination. SEEK IMMEDIATE MEDICAL CARE IF:   You have a fever.  You are leaking fluid from your vagina.  You have spotting or bleeding from your vagina.  You have severe abdominal cramping or pain.  You have rapid weight loss or gain.  You have shortness of breath with chest pain.  You notice sudden or extreme swelling of your face, hands, ankles, feet, or legs.  You have not felt your baby move in over an hour.  You have severe headaches that do not go away with medicine.  You have vision changes.   This information is not intended to replace advice given to you by your health care provider. Make sure you discuss any questions you have with your health care provider.   Document Released:  06/21/2001 Document Revised: 07/18/2014 Document Reviewed: 08/28/2012 [employer] Interactive Patient Education 2016 [employer]  Breastfeeding Deciding to breastfeed is one of the best choices you can make for you and your baby. A change in hormones during pregnancy causes your breast tissue to grow and increases the number and size of your milk ducts. These hormones also allow proteins, sugars, and fats from your blood supply to make breast milk in your milk-producing glands. Hormones prevent breast milk from being released before your baby is born as well as prompt milk flow after birth. Once breastfeeding has begun, thoughts of your baby, as well as his or her sucking or crying, can stimulate the release of milk from your milk-producing glands.  BENEFITS OF BREASTFEEDING For Your Baby  Your first milk (colostrum) helps your baby's digestive system function better.  There are antibodies in your milk that help your baby fight off infections.  Your baby has a lower incidence of asthma, allergies, and sudden infant death syndrome.  The nutrients in breast milk are better for your baby than infant formulas and are designed uniquely for your baby's needs.  Breast milk improves your baby's brain development.  Your baby is less likely to develop other conditions, such as childhood obesity, asthma, or type 2 diabetes mellitus. For You  Breastfeeding helps to create a very special bond between you and your baby.  Breastfeeding is convenient. Breast milk is always available at the correct temperature and costs nothing.  Breastfeeding helps to burn calories and helps you lose the weight gained during pregnancy.  Breastfeeding makes your uterus contract to its prepregnancy size faster and slows bleeding (lochia) after you give birth.   Breastfeeding helps to lower your risk of developing type 2 diabetes mellitus, osteoporosis, and breast or ovarian cancer later in life. SIGNS THAT YOUR BABY IS  HUNGRY Early Signs of Hunger  Increased alertness or activity.  Stretching.  Movement of the head from side to side.  Movement of the head and opening of the mouth when the corner of the mouth or cheek is stroked (rooting).  Increased sucking sounds, smacking lips, cooing, sighing, or squeaking.  Hand-to-mouth movements.  Increased sucking of fingers or hands. Late Signs of Hunger  Fussing.  Intermittent crying. Extreme Signs of Hunger Signs of extreme hunger will require calming and consoling before your baby will be able to breastfeed successfully. Do not wait for the following signs of extreme hunger to occur before you initiate breastfeeding:  Restlessness.  A loud, strong cry.  Screaming.   BREASTFEEDING BASICS Breastfeeding Initiation  Find a comfortable place to sit or lie down, with your neck and back well supported.  Place a pillow or rolled up blanket under your baby to bring him or her to the level of your breast (if you are seated). Nursing pillows are specially designed to help support your arms and your baby while you breastfeed.  Make sure that your baby's abdomen is facing your abdomen.  Gently massage your breast. With your fingertips, massage from your chest wall toward your nipple in a circular motion. This encourages milk flow. You may need to continue this action during the feeding if your milk flows slowly.  Support your breast with 4 fingers underneath and your thumb above your nipple. Make sure your fingers are well away from your nipple and your baby's mouth.  Stroke your baby's lips gently with your finger or nipple.  When your baby's mouth is open wide enough, quickly bring your baby to your breast, placing your entire nipple and as much of the colored area around your nipple (areola) as possible into your baby's mouth.  More areola should be visible above your baby's upper lip than below the lower lip.  Your baby's tongue should be between his  or her lower gum and your breast.  Ensure that your baby's mouth is correctly positioned around your nipple (latched). Your baby's lips should create a seal on your breast and be turned out (everted).  It is common for your baby to suck about 2-3 minutes in order to start the flow of breast milk. Latching Teaching your baby how to latch on to your breast properly is very important. An improper latch can cause nipple pain and decreased milk supply for you and poor weight gain in your baby. Also, if your baby is not latched onto your nipple properly, he or she may swallow some air during feeding. This can make your baby fussy. Burping your baby when you switch breasts during the feeding can help to get rid of the air. However, teaching your baby to latch on properly is still the best way to prevent fussiness from swallowing air while breastfeeding. Signs that your baby has successfully latched on to your nipple:  Silent tugging or silent sucking, without causing you pain.  Swallowing heard between every 3-4 sucks.  Muscle movement above and in front of his or her ears while sucking. Signs that your baby has not successfully latched on to nipple:  Sucking sounds or smacking sounds from your baby while breastfeeding.  Nipple pain. If you think your baby has not latched on correctly, slip your finger into the corner of your baby's mouth to break the suction and place it between your baby's gums. Attempt breastfeeding initiation again. Signs of Successful Breastfeeding Signs from your baby:  A gradual decrease in the number of sucks or complete cessation of sucking.  Falling asleep.  Relaxation of his or her body.  Retention of a small amount of milk in his or her mouth.  Letting go of your breast by himself or herself. Signs from you:  Breasts that have increased in firmness, weight, and size 1-3 hours after feeding.  Breasts that are softer immediately after  breastfeeding.  Increased milk volume, as well as a change in milk consistency and color by the fifth day of breastfeeding.  Nipples that are not sore, cracked, or bleeding. Signs That Your Baby is Getting Enough Milk  Wetting at least 3 diapers in a 24-hour period.   The urine should be clear and pale yellow by age 5 days.  At least 3 stools in a 24-hour period by age 5 days. The stool should be soft and yellow.  At least 3 stools in a 24-hour period by age 7 days. The stool should be seedy and yellow.  No loss of weight greater than 10% of birth weight during the first 3 days of age.  Average weight gain of 4-7 ounces (113-198 g) per week after age 4 days.  Consistent daily weight gain by age 5 days, without weight loss after the age of 2 weeks. After a feeding, your baby may spit up a small amount. This is common. BREASTFEEDING FREQUENCY AND DURATION Frequent feeding will help you make more milk and can prevent sore nipples and breast engorgement. Breastfeed when you feel the need to reduce the fullness of your breasts or when your baby shows signs of hunger. This is called "breastfeeding on demand." Avoid introducing a pacifier to your baby while you are working to establish breastfeeding (the first 4-6 weeks after your baby is born). After this time you may choose to use a pacifier. Research has shown that pacifier use during the first year of a baby's life decreases the risk of sudden infant death syndrome (SIDS). Allow your baby to feed on each breast as long as he or she wants. Breastfeed until your baby is finished feeding. When your baby unlatches or falls asleep while feeding from the first breast, offer the second breast. Because newborns are often sleepy in the first few weeks of life, you may need to awaken your baby to get him or her to feed. Breastfeeding times will vary from baby to baby. However, the following rules can serve as a guide to help you ensure that your baby is  properly fed:  Newborns (babies 4 weeks of age or younger) may breastfeed every 1-3 hours.  Newborns should not go longer than 3 hours during the day or 5 hours during the night without breastfeeding.  You should breastfeed your baby a minimum of 8 times in a 24-hour period until you begin to introduce solid foods to your baby at around 6 months of age. BREAST MILK PUMPING Pumping and storing breast milk allows you to ensure that your baby is exclusively fed your breast milk, even at times when you are unable to breastfeed. This is especially important if you are going back to work while you are still breastfeeding or when you are not able to be present during feedings. Your lactation consultant can give you guidelines on how long it is safe to store breast milk. A breast pump is a machine that allows you to pump milk from your breast into a sterile bottle. The pumped breast milk can then be stored in a refrigerator or freezer. Some breast pumps are operated by hand, while others use electricity. Ask your lactation consultant which type will work best for you. Breast pumps can be purchased, but some hospitals and breastfeeding support groups lease breast pumps on a monthly basis. A lactation consultant can teach you how to hand express breast milk, if you prefer not to use a pump. CARING FOR YOUR BREASTS WHILE YOU BREASTFEED Nipples can become dry, cracked, and sore while breastfeeding. The following recommendations can help keep your breasts moisturized and healthy:  Avoid using soap on your nipples.  Wear a supportive bra. Although not required, special nursing bras and tank tops are designed to allow access to your   breasts for breastfeeding without taking off your entire bra or top. Avoid wearing underwire-style bras or extremely tight bras.  Air dry your nipples for 3-4minutes after each feeding.  Use only cotton bra pads to absorb leaked breast milk. Leaking of breast milk between feedings  is normal.  Use lanolin on your nipples after breastfeeding. Lanolin helps to maintain your skin's normal moisture barrier. If you use pure lanolin, you do not need to wash it off before feeding your baby again. Pure lanolin is not toxic to your baby. You may also hand express a few drops of breast milk and gently massage that milk into your nipples and allow the milk to air dry. In the first few weeks after giving birth, some women experience extremely full breasts (engorgement). Engorgement can make your breasts feel heavy, warm, and tender to the touch. Engorgement peaks within 3-5 days after you give birth. The following recommendations can help ease engorgement:  Completely empty your breasts while breastfeeding or pumping. You may want to start by applying warm, moist heat (in the shower or with warm water-soaked hand towels) just before feeding or pumping. This increases circulation and helps the milk flow. If your baby does not completely empty your breasts while breastfeeding, pump any extra milk after he or she is finished.  Wear a snug bra (nursing or regular) or tank top for 1-2 days to signal your body to slightly decrease milk production.  Apply ice packs to your breasts, unless this is too uncomfortable for you.  Make sure that your baby is latched on and positioned properly while breastfeeding. If engorgement persists after 48 hours of following these recommendations, contact your health care provider or a lactation consultant. OVERALL HEALTH CARE RECOMMENDATIONS WHILE BREASTFEEDING  Eat healthy foods. Alternate between meals and snacks, eating 3 of each per day. Because what you eat affects your breast milk, some of the foods may make your baby more irritable than usual. Avoid eating these foods if you are sure that they are negatively affecting your baby.  Drink milk, fruit juice, and water to satisfy your thirst (about 10 glasses a day).  Rest often, relax, and continue to take  your prenatal vitamins to prevent fatigue, stress, and anemia.  Continue breast self-awareness checks.  Avoid chewing and smoking tobacco. Chemicals from cigarettes that pass into breast milk and exposure to secondhand smoke may harm your baby.  Avoid alcohol and drug use, including marijuana. Some medicines that may be harmful to your baby can pass through breast milk. It is important to ask your health care provider before taking any medicine, including all over-the-counter and prescription medicine as well as vitamin and herbal supplements. It is possible to become pregnant while breastfeeding. If birth control is desired, ask your health care provider about options that will be safe for your baby. SEEK MEDICAL CARE IF:  You feel like you want to stop breastfeeding or have become frustrated with breastfeeding.  You have painful breasts or nipples.  Your nipples are cracked or bleeding.  Your breasts are red, tender, or warm.  You have a swollen area on either breast.  You have a fever or chills.  You have nausea or vomiting.  You have drainage other than breast milk from your nipples.  Your breasts do not become full before feedings by the fifth day after you give birth.  You feel sad and depressed.  Your baby is too sleepy to eat well.  Your baby is having trouble sleeping.     Your baby is wetting less than 3 diapers in a 24-hour period.  Your baby has less than 3 stools in a 24-hour period.  Your baby's skin or the white part of his or her eyes becomes yellow.   Your baby is not gaining weight by 5 days of age. SEEK IMMEDIATE MEDICAL CARE IF:  Your baby is overly tired (lethargic) and does not want to wake up and feed.  Your baby develops an unexplained fever.   This information is not intended to replace advice given to you by your health care provider. Make sure you discuss any questions you have with your health care provider.   Document Released: 06/27/2005  Document Revised: 03/18/2015 Document Reviewed: 12/19/2012 [employer] Interactive Patient Education 2016 [employer]  

## 2016-05-12 NOTE — Progress Notes (Signed)
   PRENATAL VISIT NOTE  Subjective:  Nicole Gallegos is a 30 y.o. [phone] at 283w4d being seen today for ongoing prenatal care.  She is currently monitored for the following issues for this high-risk pregnancy and has Anxiety and depression; History of blood transfusion; Asthma, chronic; Panic attacks; Seizures (HCC); Prior pregnancy with fetal demise at 16 weeks; Tension headache, chronic; Migraine; Supervision of high risk pregnancy, antepartum; High risk pregnancy due to history of preterm labor, antepartum; Depression affecting pregnancy, antepartum; Echogenic intracardiac focus of fetus on prenatal ultrasound; and Abnormal fetal ultrasound on her problem list.  Patient reports pelvic pressure.  Contractions: Irritability. Vag. Bleeding: None.  Movement: Present. Denies leaking of fluid.   The following portions of the patient's history were reviewed and updated as appropriate: allergies, current medications, past family history, past medical history, past social history, past surgical history and problem list. Problem list updated.  Objective:   Vitals:   05/12/16 0751 05/12/16 0756  BP: (!) 143/67 100/65  Pulse: (!) 101   Weight: 145 lb (65.8 kg)     Fetal Status: Fetal Heart Rate (bpm): 148 Fundal Height: 29 cm Movement: Present  Presentation: Vertex  General:  Alert, oriented and cooperative. Patient is in no acute distress.  Skin: Skin is warm and dry. No rash noted.   Cardiovascular: Normal heart rate noted  Respiratory: Normal respiratory effort, no problems with respiration noted  Abdomen: Soft, gravid, appropriate for gestational age. Pain/Pressure: Present     Pelvic:  Cervical exam performed Dilation: Closed Effacement (%): Thick Station: -1  Extremities: Normal range of motion.  Edema: None  Mental Status: Normal mood and affect. Normal behavior. Normal judgment and thought content.   Assessment and Plan:  Pregnancy: [id number] at [redacted]w[redacted]d  1. Supervision of high risk  pregnancy, antepartum Continue high risk prenatal care.  - hydroxyprogesterone caproate (MAKENA) 250 mg/mL injection 250 mg; Inject 1 mL (250 mg total) into the muscle once.  2. High risk pregnancy due to history of preterm labor, antepartum Continue 17 P weekly Maternity belt - hydroxyprogesterone caproate (MAKENA) 250 mg/mL injection 250 mg; Inject 1 mL (250 mg total) into the muscle once.  3. Prior pregnancy with fetal demise at 16 weeks   4. Mild intermittent chronic asthma without complication Have refilled her pulmicort--continue and albuterol prn - budesonide (PULMICORT FLEXHALER) 180 MCG/ACT inhaler; Inhale 2 puffs into the lungs 2 (two) times daily.  Dispense: 1 Inhaler; Refill: 2  Preterm labor symptoms and general obstetric precautions including but not limited to vaginal bleeding, contractions, leaking of fluid and fetal movement were reviewed in detail with the patient. Please refer to After Visit Summary for other counseling recommendations.  Return in 2 weeks (on 05/26/2016) for 17 P weekly, HR[facility].  Reva Boresanya S Dahir Ayer, MD

## 2016-05-13 ENCOUNTER — Encounter: Payer: Self-pay | Admitting: *Deleted

## 2016-05-19 ENCOUNTER — Ambulatory Visit: Payer: Self-pay

## 2016-05-21 ENCOUNTER — Encounter (HOSPITAL_COMMUNITY): Payer: Self-pay | Admitting: Certified Nurse Midwife

## 2016-05-21 ENCOUNTER — Inpatient Hospital Stay (HOSPITAL_COMMUNITY)
Admission: AD | Admit: 2016-05-21 | Discharge: 2016-05-21 | Disposition: A | Discharge status: Home / Self Care | Payer: Medicaid Other | Source: Ambulatory Visit | Attending: Obstetrics and Gynecology | Admitting: Obstetrics and Gynecology

## 2016-05-21 DIAGNOSIS — Z3A31 31 weeks gestation of pregnancy: Secondary | ICD-10-CM | POA: Diagnosis not present

## 2016-05-21 DIAGNOSIS — O4703 False labor before 37 completed weeks of gestation, third trimester: Secondary | ICD-10-CM

## 2016-05-21 DIAGNOSIS — M25559 Pain in unspecified hip: Secondary | ICD-10-CM | POA: Diagnosis present

## 2016-05-21 LAB — URINE MICROSCOPIC-ADD ON

## 2016-05-21 LAB — URINALYSIS, ROUTINE W REFLEX MICROSCOPIC
Bilirubin Urine: NEGATIVE
GLUCOSE, UA: NEGATIVE mg/dL
Hgb urine dipstick: NEGATIVE
KETONES UR: NEGATIVE mg/dL
NITRITE: NEGATIVE
PH: 6.5 (ref 5.0–8.0)
PROTEIN: NEGATIVE mg/dL
Specific Gravity, Urine: 1.02 (ref 1.005–1.030)

## 2016-05-21 LAB — FETAL FIBRONECTIN: Fetal Fibronectin: NEGATIVE

## 2016-05-21 MED ORDER — NIFEDIPINE 10 MG PO CAPS
10.0000 mg | ORAL_CAPSULE | Freq: Once | ORAL | Status: AC
  Administered 2016-05-21: 10 mg via ORAL
  Filled 2016-05-21: qty 1

## 2016-05-21 NOTE — MAU Provider Note (Signed)
Chief Complaint:  Hip Pain   First Provider Initiated Contact with Patient 05/21/16 1723     HPI: Nicole Gallegos is a 30 y.o. [id number] at 7331w6dwho presents to maternity admissions reporting contractions and discomfort in hips.  She reports good fetal movement, denies LOF, vaginal bleeding, vaginal itching/burning, urinary symptoms, h/a, dizziness, n/v, diarrhea, constipation or fever/chills.  She denies headache, visual changes or RUQ abdominal pain.  Is on 17 P injections due to history of a 36 week delivery.  Abdominal Pain  This is a new problem. The current episode started today. The onset quality is gradual. The problem occurs intermittently. The problem has been unchanged. The pain is located in the LLQ, RLQ and suprapubic region. The pain is moderate. The quality of the pain is cramping. The abdominal pain radiates to the pelvis. Pertinent negatives include no constipation, diarrhea, fever, headaches, myalgias, nausea or vomiting. Nothing aggravates the pain. The pain is relieved by nothing. She has tried nothing for the symptoms.   RN Note: Pt states that she is here for hip pain and pelvic pressure. Pt denies ctxs, LOF or vaginal bleeding. Fetus is active.   Past Medical History: Past Medical History:  Diagnosis Date  . Asthma    Exercise induced;inhaler prn  . Constipation   . Depression    No meds. currently  . Diabetes type 2, uncontrolled (HCC)   . History of blood transfusion    Accident at 7 yoa and lost a lot of blood  . Migraine   . Panic attacks    No meds currently;used to take meds in 2009  . Preterm labor 2010   2nd child;had to stop labor 2 times;due to stress  . Scoliosis   . Seizures (HCC)    if gets overheated;no meds, last seizure 6 yrs ago    Past obstetric history: OB History  Gravida Para Term Preterm AB Living  6 3 2 1 2 3   SAB TAB Ectopic Multiple Live Births  1 1 0 0 3    # Outcome Date GA Lbr Len/2nd Weight Sex Delivery Anes PTL Lv  6  Current           5 Preterm 04/19/13 5724w1d 06:02 / 00:05 5 lb 8.7 oz (2.515 kg) F Vag-Spont None  LIV  4 TAB 2013 2523w3d            Birth Comments: No complications  3 SAB 2011 297w0d            Birth Comments: D&C;no complications  2 Term 10/02/08 3745w0d  5 lb 7 oz (2.466 kg) F Vag-Spont None  LIV     Birth Comments: No complications  1 Term 12/12/05 3059w0d  7 lb 7 oz (3.374 kg) F Vag-Spont None  LIV     Birth Comments: No complications      Past Surgical History: Past Surgical History:  Procedure Laterality Date  . DILATION AND CURETTAGE OF UTERUS     Suction D&C to remove 16 wk. fetal demise  . FOOT SURGERY     R foot, repair torn tissue and skin graft  . WISDOM TOOTH EXTRACTION  2008    Family History: Family History  Problem Relation Age of Onset  . Seizures Father     if gets overheated  . Heart disease Maternal Aunt     Hole in the heart  . Heart disease Maternal Uncle     Hole in the heart  . Heart attack Maternal Grandmother  Deceased  . Hypertension Maternal Grandmother   . Hypertension Maternal Uncle   . Hypertension Paternal Uncle   . Diabetes Paternal Grandmother   . Seizures Daughter     if gets overheated  . Seizures Daughter     if gets overheated  . Bipolar disorder Cousin     maternal  . Depression Cousin     maternal  . Other Daughter     1st child had 2 vein umbilical cord; Pt did weekly NSTs.  . Anesthesia problems Neg Hx     Social History: Social History  Substance Use Topics  . Smoking status: Never Smoker  . Smokeless tobacco: Never Used  . Alcohol use No     Comment: Occasioanlly prior to pregnancy    Allergies:  Allergies  Allergen Reactions  . Chocolate Hives    Meds:  Prescriptions Prior to Admission  Medication Sig Dispense Refill Last Dose  . acetaminophen (TYLENOL) 325 MG tablet Take 325 mg by mouth every 6 (six) hours as needed for headache.   Taking  . albuterol (PROVENTIL HFA;VENTOLIN HFA) 108 (90 Base) MCG/ACT  inhaler Inhale 2 puffs into the lungs every 6 (six) hours as needed for wheezing or shortness of breath.   Taking  . budesonide (PULMICORT FLEXHALER) 180 MCG/ACT inhaler Inhale 2 puffs into the lungs 2 (two) times daily. 1 Inhaler 2   . Prenatal Vit-Fe Fumarate-FA (PRENATAL MULTIVITAMIN) TABS tablet Take 1 tablet by mouth daily.   Taking    I have reviewed patient's Past Medical Hx, Surgical Hx, Family Hx, Social Hx, medications and allergies.   ROS:  Review of Systems  Constitutional: Negative for fever.  Gastrointestinal: Positive for abdominal pain. Negative for constipation, diarrhea, nausea and vomiting.  Musculoskeletal: Negative for myalgias.  Neurological: Negative for headaches.   Other systems negative  Physical Exam  Patient Vitals for the past 24 hrs:  BP Temp Temp src Pulse Resp  05/21/16 1720 114/70 98 F (36.7 C) Oral 93 18   Constitutional: Well-developed, well-nourished female in no acute distress.  Cardiovascular: normal rate and rhythm Respiratory: normal effort, clear to auscultation bilaterally GI: Abd soft, non-tender, gravid appropriate for gestational age.   No rebound or guarding. MS: Extremities nontender, no edema, normal ROM Neurologic: Alert and oriented x 4.  GU: Neg CVAT.  PELVIC EXAM: Cervix pink, visually closed, without lesion, scant white creamy discharge, vaginal walls and external genitalia normal    Cervix closed and long, firm  FHT:  Baseline 135 , moderate variability, accelerations present, no decelerations Contractions:  Irritability,  Irregular     Each UC lasted only 30 seconds.   Labs: No results found for this or any previous visit (from the past 24 hour(s)). AB/POS/-- (07/11 [id number])  Imaging:  [place] [facility] Ob Follow Up  Result Date: 04/29/2016 OBSTETRICAL ULTRASOUND: This exam was performed within a [facility]. The OB US report was generated in the [facility] system, and faxed to the ordering physician.  This report is  available in the [facility] [employer] PACS. See the [facility] Obstetric US report via the Image Link.   MAU Course/MDM: I have ordered labs and reviewed results.  NST reviewed Since she had irritability, I sent a fetal fibronectin which came back negative.  I did give her one Procardia which did lessen the frequency of contractions to one every 7-10 minutes.  Discussed since FFn neg and cervix closed we do not need to give any more Procardia.  Treatments in MAU included EFM,  FFn, Procardia.    Assessment: SIUP at [redacted]w[redacted]d Preterm contractions with no cervical dilation and negative fetal fibronectin  Plan: Discharge home Preterm Labor precautions and fetal kick counts Follow up in Office for prenatal visits and recheck of cervix  Encouraged to return here or to other Urgent Care/ED if she develops worsening of symptoms, increase in pain, fever, or other concerning symptoms.    Pt stable at time of discharge.  Wynelle Bourgeois CNM, MSN Certified Nurse-Midwife 05/21/2016 5:23 PM

## 2016-05-21 NOTE — Discharge Instructions (Signed)

## 2016-05-21 NOTE — MAU Note (Signed)
Pt states that she is here for hip pain and pelvic pressure. Pt denies ctxs, LOF or vaginal bleeding. Fetus is active.

## 2016-05-26 ENCOUNTER — Ambulatory Visit (INDEPENDENT_AMBULATORY_CARE_PROVIDER_SITE_OTHER): Payer: Medicaid Other | Admitting: Family Medicine

## 2016-05-26 VITALS — BP 115/66 | HR 87 | Wt 148.0 lb

## 2016-05-26 DIAGNOSIS — O09213 Supervision of pregnancy with history of pre-term labor, third trimester: Secondary | ICD-10-CM | POA: Diagnosis present

## 2016-05-26 DIAGNOSIS — O09219 Supervision of pregnancy with history of pre-term labor, unspecified trimester: Secondary | ICD-10-CM

## 2016-05-26 DIAGNOSIS — O09299 Supervision of pregnancy with other poor reproductive or obstetric history, unspecified trimester: Secondary | ICD-10-CM

## 2016-05-26 MED ORDER — HYDROXYPROGESTERONE CAPROATE 250 MG/ML IM OIL
250.0000 mg | TOPICAL_OIL | Freq: Once | INTRAMUSCULAR | Status: AC
  Administered 2016-05-26: 250 mg via INTRAMUSCULAR

## 2016-05-26 NOTE — Progress Notes (Signed)
   PRENATAL VISIT NOTE  Subjective:  Nicole Gallegos is a 30 y.o. [phone] at 668w4d being seen today for ongoing prenatal care.  She is currently monitored for the following issues for this high-risk pregnancy and has Anxiety and depression; History of blood transfusion; Asthma, chronic; Panic attacks; Seizures (HCC); Prior pregnancy with fetal demise at 16 weeks; Tension headache, chronic; Migraine; Supervision of high risk pregnancy, antepartum; High risk pregnancy due to history of preterm labor, antepartum; Depression affecting pregnancy, antepartum; Echogenic intracardiac focus of fetus on prenatal ultrasound; and Abnormal fetal ultrasound on her problem list.  Patient reports contractions felt throughout the day. Worse when walking..  Contractions: Irritability. Vag. Bleeding: None.  Movement: Present. Denies leaking of fluid.   The following portions of the patient's history were reviewed and updated as appropriate: allergies, current medications, past family history, past medical history, past social history, past surgical history and problem list. Problem list updated.  Objective:   Vitals:   05/26/16 1301  BP: 115/66  Pulse: 87  Weight: 148 lb (67.1 kg)    Fetal Status: Fetal Heart Rate (bpm): 144 Fundal Height: 31 cm Movement: Present  Presentation: Vertex  General:  Alert, oriented and cooperative. Patient is in no acute distress.  Skin: Skin is warm and dry. No rash noted.   Cardiovascular: Normal heart rate noted  Respiratory: Normal respiratory effort, no problems with respiration noted  Abdomen: Soft, gravid, appropriate for gestational age. Pain/Pressure: Present     Pelvic:  Cervical exam performed Dilation: Closed Effacement (%): Thick    Extremities: Normal range of motion.  Edema: None  Mental Status: Normal mood and affect. Normal behavior. Normal judgment and thought content.   Assessment and Plan:  Pregnancy: [id number] at 218w4d  1. High risk pregnancy due to  history of preterm labor, antepartum Continue makena. Cervix unchanged.  Preterm labor precautions given. - hydroxyprogesterone caproate (MAKENA) 250 mg/mL injection 250 mg; Inject 1 mL (250 mg total) into the muscle once.   Preterm labor symptoms and general obstetric precautions including but not limited to vaginal bleeding, contractions, leaking of fluid and fetal movement were reviewed in detail with the patient. Please refer to After Visit Summary for other counseling recommendations.  Return in about 2 weeks (around 06/09/2016).   Levie HeritageJacob J Witney Huie, DO

## 2016-05-27 ENCOUNTER — Encounter (HOSPITAL_COMMUNITY): Payer: Self-pay

## 2016-05-27 ENCOUNTER — Other Ambulatory Visit (HOSPITAL_COMMUNITY): Payer: Self-pay | Admitting: Maternal and Fetal Medicine

## 2016-05-27 ENCOUNTER — Ambulatory Visit (HOSPITAL_COMMUNITY)
Admission: RE | Admit: 2016-05-27 | Discharge: 2016-05-27 | Disposition: A | Discharge status: Home / Self Care | Payer: Medicaid Other | Source: Ambulatory Visit | Attending: Obstetrics and Gynecology | Admitting: Obstetrics and Gynecology

## 2016-05-27 DIAGNOSIS — O283 Abnormal ultrasonic finding on antenatal screening of mother: Secondary | ICD-10-CM

## 2016-05-27 DIAGNOSIS — O358XX0 Maternal care for other (suspected) fetal abnormality and damage, not applicable or unspecified: Secondary | ICD-10-CM | POA: Insufficient documentation

## 2016-05-27 DIAGNOSIS — O09213 Supervision of pregnancy with history of pre-term labor, third trimester: Secondary | ICD-10-CM | POA: Diagnosis not present

## 2016-05-27 DIAGNOSIS — Z3A32 32 weeks gestation of pregnancy: Secondary | ICD-10-CM

## 2016-05-27 DIAGNOSIS — Z362 Encounter for other antenatal screening follow-up: Secondary | ICD-10-CM

## 2016-06-11 ENCOUNTER — Inpatient Hospital Stay (HOSPITAL_COMMUNITY)
Admission: AD | Admit: 2016-06-11 | Discharge: 2016-06-11 | Disposition: A | Discharge status: Home / Self Care | Payer: Medicaid Other | Source: Ambulatory Visit | Attending: Obstetrics & Gynecology | Admitting: Obstetrics & Gynecology

## 2016-06-11 ENCOUNTER — Encounter (HOSPITAL_COMMUNITY): Payer: Self-pay | Admitting: *Deleted

## 2016-06-11 DIAGNOSIS — O4700 False labor before 37 completed weeks of gestation, unspecified trimester: Secondary | ICD-10-CM

## 2016-06-11 DIAGNOSIS — J45909 Unspecified asthma, uncomplicated: Secondary | ICD-10-CM | POA: Insufficient documentation

## 2016-06-11 DIAGNOSIS — M419 Scoliosis, unspecified: Secondary | ICD-10-CM | POA: Diagnosis not present

## 2016-06-11 DIAGNOSIS — Z3A34 34 weeks gestation of pregnancy: Secondary | ICD-10-CM | POA: Diagnosis not present

## 2016-06-11 DIAGNOSIS — O283 Abnormal ultrasonic finding on antenatal screening of mother: Secondary | ICD-10-CM

## 2016-06-11 DIAGNOSIS — O99340 Other mental disorders complicating pregnancy, unspecified trimester: Secondary | ICD-10-CM

## 2016-06-11 DIAGNOSIS — O99513 Diseases of the respiratory system complicating pregnancy, third trimester: Secondary | ICD-10-CM | POA: Insufficient documentation

## 2016-06-11 DIAGNOSIS — F32A Depression, unspecified: Secondary | ICD-10-CM

## 2016-06-11 DIAGNOSIS — F329 Major depressive disorder, single episode, unspecified: Secondary | ICD-10-CM

## 2016-06-11 DIAGNOSIS — O479 False labor, unspecified: Secondary | ICD-10-CM

## 2016-06-11 DIAGNOSIS — O09219 Supervision of pregnancy with history of pre-term labor, unspecified trimester: Secondary | ICD-10-CM

## 2016-06-11 DIAGNOSIS — O0990 Supervision of high risk pregnancy, unspecified, unspecified trimester: Secondary | ICD-10-CM

## 2016-06-11 LAB — URINALYSIS, ROUTINE W REFLEX MICROSCOPIC
Bilirubin Urine: NEGATIVE
Glucose, UA: NEGATIVE mg/dL
Hgb urine dipstick: NEGATIVE
Ketones, ur: NEGATIVE mg/dL
NITRITE: NEGATIVE
Protein, ur: NEGATIVE mg/dL
SPECIFIC GRAVITY, URINE: 1.02 (ref 1.005–1.030)
pH: 6.5 (ref 5.0–8.0)

## 2016-06-11 LAB — URINE MICROSCOPIC-ADD ON: RBC / HPF: NONE SEEN RBC/hpf (ref 0–5)

## 2016-06-11 MED ORDER — NIFEDIPINE 10 MG PO CAPS
10.0000 mg | ORAL_CAPSULE | ORAL | Status: AC | PRN
  Administered 2016-06-11 (×3): 10 mg via ORAL
  Filled 2016-06-11 (×3): qty 1

## 2016-06-11 NOTE — MAU Provider Note (Signed)
History     CSN: [id number]  Arrival date and time: 06/11/16 1478   First Provider Initiated Contact with Patient 06/11/16 0720      Chief Complaint  Patient presents with  . Contractions   HPI Ms. Nicole Gallegos is a 30 y.o. [id number] at [redacted]w[redacted]d who presents to MAU today with complaint of contractions. The patient states a history of PTL and PTD with previous pregnancies. She states contractions started last night and were q 4-7 minutes. She called the hospital and was told to come if contractions were 3-5 minutes apart. She states that contractions slowed and "baby went to sleep" last night, but this morning contractions resumed. She denies vaginal bleeding, LOF, UTI symptoms or recent intercourse. She reports normal fetal movement.   OB History    Gravida Para Term Preterm AB Living   6 3 2 1 2 3    SAB TAB Ectopic Multiple Live Births   1 1 0 0 3      Past Medical History:  Diagnosis Date  . Asthma    Exercise induced;inhaler prn  . Constipation   . Depression    No meds. currently  . Diabetes type 2, uncontrolled (HCC)   . History of blood transfusion    Accident at 7 yoa and lost a lot of blood  . Migraine   . Panic attacks    No meds currently;used to take meds in 2009  . Preterm labor 2010   2nd child;had to stop labor 2 times;due to stress  . Scoliosis   . Seizures (HCC)    if gets overheated;no meds, last seizure 6 yrs ago    Past Surgical History:  Procedure Laterality Date  . DILATION AND CURETTAGE OF UTERUS     Suction D&C to remove 16 wk. fetal demise  . FOOT SURGERY     R foot, repair torn tissue and skin graft  . WISDOM TOOTH EXTRACTION  2008    Family History  Problem Relation Age of Onset  . Seizures Father     if gets overheated  . Heart disease Maternal Aunt     Hole in the heart  . Heart disease Maternal Uncle     Hole in the heart  . Heart attack Maternal Grandmother     Deceased  . Hypertension Maternal Grandmother   . Hypertension  Maternal Uncle   . Hypertension Paternal Uncle   . Diabetes Paternal Grandmother   . Seizures Daughter     if gets overheated  . Seizures Daughter     if gets overheated  . Bipolar disorder Cousin     maternal  . Depression Cousin     maternal  . Other Daughter     1st child had 2 vein umbilical cord; Pt did weekly NSTs.  . Anesthesia problems Neg Hx     Social History  Substance Use Topics  . Smoking status: Never Smoker  . Smokeless tobacco: Never Used  . Alcohol use No     Comment: Occasioanlly prior to pregnancy    Allergies:  Allergies  Allergen Reactions  . Chocolate Hives    Prescriptions Prior to Admission  Medication Sig Dispense Refill Last Dose  . acetaminophen (TYLENOL) 325 MG tablet Take 325 mg by mouth every 6 (six) hours as needed for headache.   Taking  . albuterol (PROVENTIL HFA;VENTOLIN HFA) 108 (90 Base) MCG/ACT inhaler Inhale 2 puffs into the lungs every 6 (six) hours as needed for wheezing or shortness of breath.  Taking  . budesonide (PULMICORT FLEXHALER) 180 MCG/ACT inhaler Inhale 2 puffs into the lungs 2 (two) times daily. 1 Inhaler 2 Taking  . HYDROXYprogesterone Caproate (MAKENA IM) Inject into the muscle.   Taking  . Prenatal Vit-Fe Fumarate-FA (PRENATAL MULTIVITAMIN) TABS tablet Take 1 tablet by mouth daily.   Taking    Review of Systems  Constitutional: Negative for fever and malaise/fatigue.  Gastrointestinal: Positive for abdominal pain. Negative for constipation, diarrhea, nausea and vomiting.  Genitourinary: Negative for dysuria, frequency and urgency.       Neg - vaginal bleeding, discharge, LOF   Physical Exam   Blood pressure 105/73, pulse 88, temperature 97.5 F (36.4 C), resp. rate 18, height 5\' 7"  (1.702 m), weight 148 lb 6.4 oz (67.3 kg), last menstrual period 10/04/2015, unknown if currently breastfeeding.  Physical Exam  Nursing note and vitals reviewed. Constitutional: She is oriented to person, place, and time. She  appears well-developed and well-nourished. No distress.  HENT:  Head: Normocephalic and atraumatic.  Neck: Normal range of motion.  Cardiovascular: Normal rate.   Respiratory: Effort normal.  GI: Soft. She exhibits no distension and no mass. There is no tenderness. There is no rebound and no guarding.  Musculoskeletal: Normal range of motion.  Neurological: She is alert and oriented to person, place, and time.  Skin: Skin is warm and dry. No erythema.  Psychiatric: She has a normal mood and affect.  Dilation: Closed Cervical Position: Posterior Presentation: Vertex Exam by:: Vonzella NippleJulie Wenzel PA   Exam at 0900: Dilation Closed/Posterior/Vertex Exam by Nicole Gallegos, CNM  Results for orders placed or performed during the hospital encounter of 06/11/16 (from the past 24 hour(s))  Urinalysis, Routine w reflex microscopic ([facility])     Status: Abnormal   Collection Time: 06/11/16  7:05 AM  Result Value Ref Range   Color, Urine YELLOW YELLOW   APPearance CLEAR CLEAR   Specific Gravity, Urine 1.020 1.005 - 1.030   pH 6.5 5.0 - 8.0   Glucose, UA NEGATIVE NEGATIVE mg/dL   Hgb urine dipstick NEGATIVE NEGATIVE   Bilirubin Urine NEGATIVE NEGATIVE   Ketones, ur NEGATIVE NEGATIVE mg/dL   Protein, ur NEGATIVE NEGATIVE mg/dL   Nitrite NEGATIVE NEGATIVE   Leukocytes, UA TRACE (A) NEGATIVE  Urine microscopic-add on     Status: Abnormal   Collection Time: 06/11/16  7:05 AM  Result Value Ref Range   Squamous Epithelial / LPF 6-30 (A) NONE SEEN   WBC, UA 6-30 0 - 5 WBC/hpf   RBC / HPF NONE SEEN 0 - 5 RBC/hpf   Bacteria, UA MANY (A) NONE SEEN   Urine-Other MUCOUS PRESENT     Fetal Monitoring: Baseline: 140 bpm Variability: moderate Accelerations: 15 x 15 Decelerations: none Contractions: few, irregular   MAU Course  Procedures None  MDM UA today  0800 - Care turned over to Nicole KitchensKathryn Teighlor Korson, CNM. Patient will need to be re-checked after contractions monitored for ~ 1 hour. PO  hydration encouraged. Contraction marker given.   Marny LowensteinJulie N Wenzel, PA-C  06/11/2016, 8:04 AM  Assessment and Plan   After 3 doses of nifedipine, patient's contractions have stopped. Patient states that she is tired of being pregnant and wants to deliver today, although she does not feel pain with her earlier contractions.  I counseled patient on the risks of pre-term delivery and subsequent possible NICU stay. Patient verbalized understanding. Patient's Tracing shows FHR at 140 BPM, moderate variability and positive accelerations. Patient reports strong fetal movement. Cervix  is unchanged during this visit. She will keep her appointment for 17 P on Thursday. Patient stable for discharge with instructions to return to MAU if contractions increase or if any bleeding or leaking of fluid.   Nicole KitchensKathryn Chani Ghanem CNM

## 2016-06-11 NOTE — MAU Note (Signed)
Contractions since 1300 Friday. Have had some preterm contractions in past. Contractions have been off and on tonight. Now "feels weird down there and just wanna know what's goin on". Denies LOF or bleeding.

## 2016-06-11 NOTE — Discharge Instructions (Signed)
Preterm Labor and Birth Information °Pregnancy normally lasts 39-41 weeks. Preterm labor is when labor starts early. It starts before you have been pregnant for 37 whole weeks. °What are the risk factors for preterm labor? °Preterm labor is more likely to occur in women who: °· Have an infection while pregnant. °· Have a cervix that is short. °· Have gone into preterm labor before. °· Have had surgery on their cervix. °· Are younger than age 29. °· Are older than age 35. °· Are African American. °· Are pregnant with two or more babies. °· Take street drugs while pregnant. °· Smoke while pregnant. °· Do not gain enough weight while pregnant. °· Got pregnant right after another pregnancy. °What are the symptoms of preterm labor? °Symptoms of preterm labor include: °· Cramps. The cramps may feel like the cramps some women get during their period. The cramps may happen with watery poop (diarrhea). °· Pain in the belly (abdomen). °· Pain in the lower back. °· Regular contractions or tightening. It may feel like your belly is getting tighter. °· Pressure in the lower belly that seems to get stronger. °· More fluid (discharge) leaking from the vagina. The fluid may be watery or bloody. °· Water breaking. °Why is it important to notice signs of preterm labor? °Babies who are born early may not be fully developed. They have a higher chance for: °· Long-term heart problems. °· Long-term lung problems. °· Trouble controlling body systems, like breathing. °· Bleeding in the brain. °· A condition called cerebral palsy. °· Learning difficulties. °· Death. °These risks are highest for babies who are born before 34 weeks of pregnancy. °How is preterm labor treated? °Treatment depends on: °· How long you were pregnant. °· Your condition. °· The health of your baby. °Treatment may involve: °· Having a stitch (suture) placed in your cervix. When you give birth, your cervix opens so the baby can come out. The stitch keeps the cervix  from opening too soon. °· Staying at the hospital. °· Taking or getting medicines, such as: °¨ Hormone medicines. °¨ Medicines to stop contractions. °¨ Medicines to help the baby’s lungs develop. °¨ Medicines to prevent your baby from having cerebral palsy. °What should I do if I am in preterm labor? °If you think you are going into labor too soon, call your doctor right away. °How can I prevent preterm labor? °· Do not use any tobacco products. °¨ Examples of these are cigarettes, chewing tobacco, and e-cigarettes. °¨ If you need help quitting, ask your doctor. °· Do not use street drugs. °· Do not use any medicines unless you ask your doctor if they are safe for you. °· Talk with your doctor before taking any herbal supplements. °· Make sure you gain enough weight. °· Watch for infection. If you think you might have an infection, get it checked right away. °· If you have gone into preterm labor before, tell your doctor. °This information is not intended to replace advice given to you by your health care provider. Make sure you discuss any questions you have with your health care provider. °Document Released: 09/23/2008 Document Revised: 12/08/2015 Document Reviewed: 11/18/2015 °[employer]vier Interactive Patient Education © 2017 [employer] ° °

## 2016-06-13 ENCOUNTER — Ambulatory Visit (INDEPENDENT_AMBULATORY_CARE_PROVIDER_SITE_OTHER): Payer: Medicaid Other | Admitting: Medical

## 2016-06-13 ENCOUNTER — Other Ambulatory Visit (HOSPITAL_COMMUNITY)
Admission: RE | Admit: 2016-06-13 | Discharge: 2016-06-13 | Disposition: A | Discharge status: Home / Self Care | Payer: Medicaid Other | Source: Ambulatory Visit | Attending: Medical | Admitting: Medical

## 2016-06-13 VITALS — BP 114/82 | HR 84 | Wt 144.6 lb

## 2016-06-13 DIAGNOSIS — O09219 Supervision of pregnancy with history of pre-term labor, unspecified trimester: Secondary | ICD-10-CM

## 2016-06-13 DIAGNOSIS — O0990 Supervision of high risk pregnancy, unspecified, unspecified trimester: Secondary | ICD-10-CM

## 2016-06-13 DIAGNOSIS — Z113 Encounter for screening for infections with a predominantly sexual mode of transmission: Secondary | ICD-10-CM | POA: Insufficient documentation

## 2016-06-13 DIAGNOSIS — O09213 Supervision of pregnancy with history of pre-term labor, third trimester: Secondary | ICD-10-CM

## 2016-06-13 DIAGNOSIS — O09893 Supervision of other high risk pregnancies, third trimester: Secondary | ICD-10-CM

## 2016-06-13 LAB — POCT URINALYSIS DIP (DEVICE)
Bilirubin Urine: NEGATIVE
Glucose, UA: NEGATIVE mg/dL
Hgb urine dipstick: NEGATIVE
Ketones, ur: NEGATIVE mg/dL
Leukocytes, UA: NEGATIVE
Nitrite: NEGATIVE
Protein, ur: 30 mg/dL — AB
Specific Gravity, Urine: >=1.03 (ref 1.005–1.030)
Urobilinogen, UA: 0.2 mg/dL (ref 0.0–1.0)
pH: 6 (ref 5.0–8.0)

## 2016-06-13 LAB — OB RESULTS CONSOLE GBS
GBS: NEGATIVE
STREP GROUP B AG: NEGATIVE

## 2016-06-13 NOTE — Patient Instructions (Signed)
Braxton Hicks Contractions Contractions of the uterus can occur throughout pregnancy. Contractions are not always a sign that you are in labor.  WHAT ARE BRAXTON HICKS CONTRACTIONS?  Contractions that occur before labor are called Braxton Hicks contractions, or false labor. Toward the end of pregnancy (32-34 weeks), these contractions can develop more often and may become more forceful. This is not true labor because these contractions do not result in opening (dilatation) and thinning of the cervix. They are sometimes difficult to tell apart from true labor because these contractions can be forceful and people have different pain tolerances. You should not feel embarrassed if you go to the hospital with false labor. Sometimes, the only way to tell if you are in true labor is for your health care provider to look for changes in the cervix. If there are no prenatal problems or other health problems associated with the pregnancy, it is completely safe to be sent home with false labor and await the onset of true labor. HOW CAN YOU TELL THE DIFFERENCE BETWEEN TRUE AND FALSE LABOR? False Labor   The contractions of false labor are usually shorter and not as hard as those of true labor.   The contractions are usually irregular.   The contractions are often felt in the front of the lower abdomen and in the groin.   The contractions may go away when you walk around or change positions while lying down.   The contractions get weaker and are shorter lasting as time goes on.   The contractions do not usually become progressively stronger, regular, and closer together as with true labor.  True Labor   Contractions in true labor last 30-70 seconds, become very regular, usually become more intense, and increase in frequency.   The contractions do not go away with walking.   The discomfort is usually felt in the top of the uterus and spreads to the lower abdomen and low back.   True labor can be  determined by your health care provider with an exam. This will show that the cervix is dilating and getting thinner.  WHAT TO REMEMBER  Keep up with your usual exercises and follow other instructions given by your health care provider.   Take medicines as directed by your health care provider.   Keep your regular prenatal appointments.   Eat and drink lightly if you think you are going into labor.   If Braxton Hicks contractions are making you uncomfortable:   Change your position from lying down or resting to walking, or from walking to resting.   Sit and rest in a tub of warm water.   Drink 2-3 glasses of water. Dehydration may cause these contractions.   Do slow and deep breathing several times an hour.  WHEN SHOULD I SEEK IMMEDIATE MEDICAL CARE? Seek immediate medical care if:  Your contractions become stronger, more regular, and closer together.   You have fluid leaking or gushing from your vagina.   You have a fever.   You pass blood-tinged mucus.   You have vaginal bleeding.   You have continuous abdominal pain.   You have low back pain that you never had before.   You feel your baby's head pushing down and causing pelvic pressure.   Your baby is not moving as much as it used to.  This information is not intended to replace advice given to you by your health care provider. Make sure you discuss any questions you have with your health care   provider. Document Released: 06/27/2005 Document Revised: 10/19/2015 Document Reviewed: 04/08/2013 Elsevier Interactive Patient Education  2017 [employer] Introduction Patient Name: ________________________________________________ Patient Due Date: ____________________ What is a fetal movement count? A fetal movement count is the number of times that you feel your baby move during a certain amount of time. This may also be called a fetal kick count. A fetal movement count is recommended for every pregnant  woman. You may be asked to start counting fetal movements as early as week 28 of your pregnancy. Pay attention to when your baby is most active. You may notice your baby's sleep and wake cycles. You may also notice things that make your baby move more. You should do a fetal movement count:  When your baby is normally most active.  At the same time each day. A good time to count movements is while you are resting, after having something to eat and drink. How do I count fetal movements? 1. Find a quiet, comfortable area. Sit, or lie down on your side. 2. Write down the date, the start time and stop time, and the number of movements that you felt between those two times. Take this information with you to your health care visits. 3. For 2 hours, count kicks, flutters, swishes, rolls, and jabs. You should feel at least 10 movements during 2 hours. 4. You may stop counting after you have felt 10 movements. 5. If you do not feel 10 movements in 2 hours, have something to eat and drink. Then, keep resting and counting for 1 hour. If you feel at least 4 movements during that hour, you may stop counting. Contact a health care provider if:  You feel fewer than 4 movements in 2 hours.  Your baby is not moving like he or she usually does. Date: ____________ Start time: ____________ Stop time: ____________ Movements: ____________ Date: ____________ Start time: ____________ Stop time: ____________ Movements: ____________ Date: ____________ Start time: ____________ Stop time: ____________ Movements: ____________ Date: ____________ Start time: ____________ Stop time: ____________ Movements: ____________ Date: ____________ Start time: ____________ Stop time: ____________ Movements: ____________ Date: ____________ Start time: ____________ Stop time: ____________ Movements: ____________ Date: ____________ Start time: ____________ Stop time: ____________ Movements: ____________ Date: ____________ Start time:  ____________ Stop time: ____________ Movements: ____________ Date: ____________ Start time: ____________ Stop time: ____________ Movements: ____________ This information is not intended to replace advice given to you by your health care provider. Make sure you discuss any questions you have with your health care provider. Document Released: 07/27/2006 Document Revised: 02/24/2016 Document Reviewed: 08/06/2015 Elsevier Interactive Patient Education  2017 [employer] Inc.  

## 2016-06-13 NOTE — Progress Notes (Signed)
   PRENATAL VISIT NOTE  Subjective:  Nicole Gallegos is a 30 y.o. [id number] at 5598w1d being seen today for ongoing prenatal care.  She is currently monitored for the following issues for this high-risk pregnancy and has Anxiety and depression; History of blood transfusion; Asthma, chronic; Panic attacks; Seizures (HCC); Prior pregnancy with fetal demise at 16 weeks; Tension headache, chronic; Migraine; Supervision of high risk pregnancy, antepartum; High risk pregnancy due to history of preterm labor, antepartum; Depression affecting pregnancy, antepartum; Echogenic intracardiac focus of fetus on prenatal ultrasound; and Abnormal fetal ultrasound on her problem list.  Patient reports backache and contractions since Saturday. Patient seen in [facility]U Saturday morning with complaint of contractions q 4-7 minutes. She was given Procardia for comfort at that time and contractions resolved. She states contractions today are q 4-7, although none noted while in the room with the patient today. She states they are more painful and now causing back pain and leg pain.   Contractions: Irregular. Vag. Bleeding: None.  Movement: Present. Denies leaking of fluid.   The following portions of the patient's history were reviewed and updated as appropriate: allergies, current medications, past family history, past medical history, past social history, past surgical history and problem list. Problem list updated.  Objective:   Vitals:   06/13/16 0805  BP: 114/82  Pulse: 84  Weight: 144 lb 9.6 oz (65.6 kg)    Fetal Status: Fetal Heart Rate (bpm): 150 Fundal Height: 35 cm Movement: Present  Presentation: Vertex  General:  Alert, oriented and cooperative. Patient is in no acute distress.  Skin: Skin is warm and dry. No rash noted.   Cardiovascular: Normal heart rate noted  Respiratory: Normal respiratory effort, no problems with respiration noted  Abdomen: Soft, gravid, appropriate for gestational age. Pain/Pressure:  Present     Pelvic:  Cervical exam performed Dilation: 1 Effacement (%): Thick Station: -1  Extremities: Normal range of motion.  Edema: None  Mental Status: Normal mood and affect. Normal behavior. Normal judgment and thought content.   Assessment and Plan:  Pregnancy: [id number] at 1398w1d  1. High risk pregnancy due to history of preterm labor, antepartum - Culture, beta strep (group b only) - GC/Chlamydia Probe Amp  2. History of preterm delivery, currently pregnant in third trimester  3. Supervision of high risk pregnancy, antepartum   Preterm labor symptoms and general obstetric precautions including but not limited to vaginal bleeding, contractions, leaking of fluid and fetal movement were reviewed in detail with the patient. Please refer to After Visit Summary for other counseling recommendations.  Return in about 1 week (around 06/20/2016) for HROB.   Marny LowensteinJulie N Ivery Michalski, PA-C

## 2016-06-14 ENCOUNTER — Inpatient Hospital Stay (HOSPITAL_COMMUNITY)
Admission: AD | Admit: 2016-06-14 | Discharge: 2016-06-14 | Disposition: A | Discharge status: Home / Self Care | Payer: Medicaid Other | Source: Ambulatory Visit | Attending: Obstetrics & Gynecology | Admitting: Obstetrics & Gynecology

## 2016-06-14 ENCOUNTER — Encounter (HOSPITAL_COMMUNITY): Payer: Self-pay | Admitting: *Deleted

## 2016-06-14 DIAGNOSIS — Z3A35 35 weeks gestation of pregnancy: Secondary | ICD-10-CM | POA: Insufficient documentation

## 2016-06-14 DIAGNOSIS — Z3689 Encounter for other specified antenatal screening: Secondary | ICD-10-CM | POA: Diagnosis not present

## 2016-06-14 DIAGNOSIS — O99513 Diseases of the respiratory system complicating pregnancy, third trimester: Secondary | ICD-10-CM | POA: Insufficient documentation

## 2016-06-14 DIAGNOSIS — O4703 False labor before 37 completed weeks of gestation, third trimester: Secondary | ICD-10-CM

## 2016-06-14 DIAGNOSIS — J45909 Unspecified asthma, uncomplicated: Secondary | ICD-10-CM | POA: Diagnosis not present

## 2016-06-14 LAB — URINALYSIS, ROUTINE W REFLEX MICROSCOPIC
BILIRUBIN URINE: NEGATIVE
GLUCOSE, UA: NEGATIVE mg/dL
Hgb urine dipstick: NEGATIVE
Ketones, ur: NEGATIVE mg/dL
NITRITE: NEGATIVE
PH: 6.5 (ref 5.0–8.0)
Protein, ur: NEGATIVE mg/dL
SPECIFIC GRAVITY, URINE: 1.02 (ref 1.005–1.030)

## 2016-06-14 LAB — URINE MICROSCOPIC-ADD ON: RBC / HPF: NONE SEEN RBC/hpf (ref 0–5)

## 2016-06-14 LAB — GC/CHLAMYDIA PROBE AMP (~~LOC~~) NOT AT ARMC
CHLAMYDIA, DNA PROBE: NEGATIVE
Neisseria Gonorrhea: NEGATIVE

## 2016-06-14 NOTE — MAU Note (Signed)
Pt presents to MAU with complaints of contractions that started on Friday and have gotten regular over the weekend. Denies any vaginal bleeding or LOF

## 2016-06-14 NOTE — Discharge Instructions (Signed)
Braxton Hicks Contractions Contractions of the uterus can occur throughout pregnancy. Contractions are not always a sign that you are in labor.  WHAT ARE BRAXTON HICKS CONTRACTIONS?  Contractions that occur before labor are called Braxton Hicks contractions, or false labor. Toward the end of pregnancy (32-34 weeks), these contractions can develop more often and may become more forceful. This is not true labor because these contractions do not result in opening (dilatation) and thinning of the cervix. They are sometimes difficult to tell apart from true labor because these contractions can be forceful and people have different pain tolerances. You should not feel embarrassed if you go to the hospital with false labor. Sometimes, the only way to tell if you are in true labor is for your health care provider to look for changes in the cervix. If there are no prenatal problems or other health problems associated with the pregnancy, it is completely safe to be sent home with false labor and await the onset of true labor. HOW CAN YOU TELL THE DIFFERENCE BETWEEN TRUE AND FALSE LABOR? False Labor   The contractions of false labor are usually shorter and not as hard as those of true labor.   The contractions are usually irregular.   The contractions are often felt in the front of the lower abdomen and in the groin.   The contractions may go away when you walk around or change positions while lying down.   The contractions get weaker and are shorter lasting as time goes on.   The contractions do not usually become progressively stronger, regular, and closer together as with true labor.  True Labor   Contractions in true labor last 30-70 seconds, become very regular, usually become more intense, and increase in frequency.   The contractions do not go away with walking.   The discomfort is usually felt in the top of the uterus and spreads to the lower abdomen and low back.   True labor  can be determined by your health care provider with an exam. This will show that the cervix is dilating and getting thinner.  WHAT TO REMEMBER  Keep up with your usual exercises and follow other instructions given by your health care provider.   Take medicines as directed by your health care provider.   Keep your regular prenatal appointments.   Eat and drink lightly if you think you are going into labor.   If Braxton Hicks contractions are making you uncomfortable:   Change your position from lying down or resting to walking, or from walking to resting.   Sit and rest in a tub of warm water.   Drink 2-3 glasses of water. Dehydration may cause these contractions.   Do slow and deep breathing several times an hour.  WHEN SHOULD I SEEK IMMEDIATE MEDICAL CARE? Seek immediate medical care if:  Your contractions become stronger, more regular, and closer together.   You have fluid leaking or gushing from your vagina.   You have a fever.   You pass blood-tinged mucus.   You have vaginal bleeding.   You have continuous abdominal pain.   You have low back pain that you never had before.   You feel your baby's head pushing down and causing pelvic pressure.   Your baby is not moving as much as it used to.  This information is not intended to replace advice given to you by your health care provider. Make sure you discuss any questions you have with your health  care provider. Document Released: 06/27/2005 Document Revised: 10/19/2015 Document Reviewed: 04/08/2013 Elsevier Interactive Patient Education  2017 [employer] Delivery Note  Introduction Patient Name: ________________________________________________ Patient Due Date: ____________________ What is a fetal movement count? A fetal movement count is the number of times that you feel your baby move during a certain amount of time. This may also be called a fetal kick count. A fetal movement count is  recommended for every pregnant woman. You may be asked to start counting fetal movements as early as week 28 of your pregnancy. Pay attention to when your baby is most active. You may notice your baby's sleep and wake cycles. You may also notice things that make your baby move more. You should do a fetal movement count:  When your baby is normally most active.  At the same time each day. A good time to count movements is while you are resting, after having something to eat and drink. How do I count fetal movements? 1. Find a quiet, comfortable area. Sit, or lie down on your side. 2. Write down the date, the start time and stop time, and the number of movements that you felt between those two times. Take this information with you to your health care visits. 3. For 2 hours, count kicks, flutters, swishes, rolls, and jabs. You should feel at least 10 movements during 2 hours. 4. You may stop counting after you have felt 10 movements. 5. If you do not feel 10 movements in 2 hours, have something to eat and drink. Then, keep resting and counting for 1 hour. If you feel at least 4 movements during that hour, you may stop counting. Contact a health care provider if:  You feel fewer than 4 movements in 2 hours.  Your baby is not moving like he or she usually does. Date: ____________ Start time: ____________ Stop time: ____________ Movements: ____________ Date: ____________ Start time: ____________ Stop time: ____________ Movements: ____________ Date: ____________ Start time: ____________ Stop time: ____________ Movements: ____________ Date: ____________ Start time: ____________ Stop time: ____________ Movements: ____________ Date: ____________ Start time: ____________ Stop time: ____________ Movements: ____________ Date: ____________ Start time: ____________ Stop time: ____________ Movements: ____________ Date: ____________ Start time: ____________ Stop time: ____________ Movements:  ____________ Date: ____________ Start time: ____________ Stop time: ____________ Movements: ____________ Date: ____________ Start time: ____________ Stop time: ____________ Movements: ____________ This information is not intended to replace advice given to you by your health care provider. Make sure you discuss any questions you have with your health care provider. Document Released: 07/27/2006 Document Revised: 02/24/2016 Document Reviewed: 08/06/2015 Elsevier Interactive Patient Education  2017 [employer]

## 2016-06-14 NOTE — MAU Provider Note (Signed)
History     CSN: [id number]  Arrival date and time: 06/14/16 [id number]   First Provider Initiated Contact with Patient 06/14/16 [phone]      Chief Complaint  Patient presents with  . Contractions   [id number] @35 .2 weeks here with ctx. She reports ctx started 4 days ago. She was seen in MAU around that time and not found to be in labor. She states ctx have continued since. She was seen in clinic yesterday with appreciable cervical change. She denies urinary sx. She denies VB and LOF. She denies vaginal discharge and malodor. She reports good FM. She reports adequate hydration.     OB History    Gravida Para Term Preterm AB Living   6 3 2 1 2 3    SAB TAB Ectopic Multiple Live Births   1 1 0 0 3      Past Medical History:  Diagnosis Date  . Asthma    Exercise induced;inhaler prn  . Constipation   . Depression    No meds. currently  . Diabetes type 2, uncontrolled (HCC)   . History of blood transfusion    Accident at 7 yoa and lost a lot of blood  . Migraine   . Panic attacks    No meds currently;used to take meds in 2009  . Preterm labor 2010   2nd child;had to stop labor 2 times;due to stress  . Scoliosis   . Seizures (HCC)    if gets overheated;no meds, last seizure 6 yrs ago    Past Surgical History:  Procedure Laterality Date  . DILATION AND CURETTAGE OF UTERUS     Suction D&C to remove 16 wk. fetal demise  . FOOT SURGERY     R foot, repair torn tissue and skin graft  . WISDOM TOOTH EXTRACTION  2008    Family History  Problem Relation Age of Onset  . Seizures Father     if gets overheated  . Heart disease Maternal Aunt     Hole in the heart  . Heart disease Maternal Uncle     Hole in the heart  . Heart attack Maternal Grandmother     Deceased  . Hypertension Maternal Grandmother   . Hypertension Maternal Uncle   . Hypertension Paternal Uncle   . Diabetes Paternal Grandmother   . Seizures Daughter     if gets overheated  . Seizures Daughter     if gets  overheated  . Bipolar disorder Cousin     maternal  . Depression Cousin     maternal  . Other Daughter     1st child had 2 vein umbilical cord; Pt did weekly NSTs.  . Anesthesia problems Neg Hx     Social History  Substance Use Topics  . Smoking status: Never Smoker  . Smokeless tobacco: Never Used  . Alcohol use No     Comment: Occasioanlly prior to pregnancy    Allergies:  Allergies  Allergen Reactions  . Chocolate Hives    Prescriptions Prior to Admission  Medication Sig Dispense Refill Last Dose  . acetaminophen (TYLENOL) 325 MG tablet Take 325 mg by mouth every 6 (six) hours as needed for mild pain, moderate pain or headache.    Taking  . albuterol (PROVENTIL HFA;VENTOLIN HFA) 108 (90 Base) MCG/ACT inhaler Inhale 2 puffs into the lungs every 6 (six) hours as needed for wheezing or shortness of breath.   Taking  . budesonide (PULMICORT FLEXHALER) 180 MCG/ACT inhaler Inhale 2 puffs into  the lungs 2 (two) times daily. 1 Inhaler 2 Taking  . hydroxyprogesterone caproate (MAKENA) 250 mg/mL OIL injection Inject 250 mg into the muscle once a week.   Taking  . Prenatal MV-Min-FA-Omega-3 (PRENATAL GUMMIES/DHA & FA) 0.4-32.5 MG CHEW Chew 2 each by mouth daily.   Taking    Review of Systems  Constitutional: Negative.   Gastrointestinal: Positive for abdominal pain.  Genitourinary: Negative.    Physical Exam   Blood pressure 96/66, pulse 119, temperature 98 F (36.7 C), resp. rate 16, height 5\' 7"  (1.702 m), weight 66.2 kg (146 lb), last menstrual period 10/04/2015, unknown if currently breastfeeding.  Physical Exam  Constitutional: She is oriented to person, place, and time. She appears well-developed and well-nourished. No distress (appears comfortable).  HENT:  Head: Normocephalic and atraumatic.  Neck: Normal range of motion. Neck supple.  Cardiovascular: Normal rate.   Respiratory: Effort normal.  GI: Soft. She exhibits no distension. There is no tenderness.  gravid   Genitourinary:  Genitourinary Comments: SVE: FT/40/-2  Musculoskeletal: Normal range of motion.  Neurological: She is alert and oriented to person, place, and time.  Skin: Skin is warm and dry.  Psychiatric: She has a normal mood and affect.   EFM: 145 bpm, mod variability, + accels, no decels Toco: irregular with irritability  MAU Course  Procedures Po hydration  MDM Labs ordered and reviewed. No evidence of dehydration or PTL. Stable for discharge home. Labor precautions discussed in detail.  Assessment and Plan   1. Preterm uterine contractions in third trimester, antepartum   2. NST (non-stress test) reactive    Discharge home Follow up as scheduled in [facility] next week Labor precautions    Medication List    TAKE these medications   acetaminophen 325 MG tablet Commonly known as:  TYLENOL Take 325 mg by mouth every 6 (six) hours as needed for mild pain, moderate pain or headache.   albuterol 108 (90 Base) MCG/ACT inhaler Commonly known as:  PROVENTIL HFA;VENTOLIN HFA Inhale 2 puffs into the lungs every 6 (six) hours as needed for wheezing or shortness of breath.   budesonide 180 MCG/ACT inhaler Commonly known as:  PULMICORT FLEXHALER Inhale 2 puffs into the lungs 2 (two) times daily.   hydroxyprogesterone caproate 250 mg/mL Oil injection Commonly known as:  MAKENA Inject 250 mg into the muscle once a week.   PRENATAL GUMMIES/DHA & FA 0.4-32.5 MG Chew Chew 2 each by mouth daily.      Donette LarryMelanie Oreoluwa Gilmer , CNM 06/14/2016, 8:44 AM

## 2016-06-16 LAB — CULTURE, BETA STREP (GROUP B ONLY)

## 2016-06-20 ENCOUNTER — Telehealth: Payer: Self-pay | Admitting: General Practice

## 2016-06-20 NOTE — Telephone Encounter (Signed)
Patient called and left message stating she is [redacted] weeks pregnant and has scoliosis. Patient states she is having a real hard time with that and back pain. Patient states she is also losing her balance a lot recently. Patient is uncertain what can be done and what her options are. Called patient back and discussed normal pains/aches of pregnancy with the last 4 weeks. Recommended tylenol & heating pad and to discuss tomorrow at her appt. Patient verbalized understanding & had no questions

## 2016-06-21 ENCOUNTER — Ambulatory Visit (INDEPENDENT_AMBULATORY_CARE_PROVIDER_SITE_OTHER): Payer: Medicaid Other | Admitting: Advanced Practice Midwife

## 2016-06-21 VITALS — BP 120/73 | HR 105 | Wt 150.1 lb

## 2016-06-21 DIAGNOSIS — O368130 Decreased fetal movements, third trimester, not applicable or unspecified: Secondary | ICD-10-CM | POA: Diagnosis not present

## 2016-06-21 DIAGNOSIS — O09213 Supervision of pregnancy with history of pre-term labor, third trimester: Secondary | ICD-10-CM

## 2016-06-21 DIAGNOSIS — O09893 Supervision of other high risk pregnancies, third trimester: Secondary | ICD-10-CM

## 2016-06-21 DIAGNOSIS — O0990 Supervision of high risk pregnancy, unspecified, unspecified trimester: Secondary | ICD-10-CM

## 2016-06-21 NOTE — Patient Instructions (Signed)
Braxton Hicks Contractions °Contractions of the uterus can occur throughout pregnancy. Contractions are not always a sign that you are in labor.  °WHAT ARE BRAXTON HICKS CONTRACTIONS?  °Contractions that occur before labor are called Braxton Hicks contractions, or false labor. Toward the end of pregnancy (32-34 weeks), these contractions can develop more often and may become more forceful. This is not true labor because these contractions do not result in opening (dilatation) and thinning of the cervix. They are sometimes difficult to tell apart from true labor because these contractions can be forceful and people have different pain tolerances. You should not feel embarrassed if you go to the hospital with false labor. Sometimes, the only way to tell if you are in true labor is for your health care provider to look for changes in the cervix. °If there are no prenatal problems or other health problems associated with the pregnancy, it is completely safe to be sent home with false labor and await the onset of true labor. °HOW CAN YOU TELL THE DIFFERENCE BETWEEN TRUE AND FALSE LABOR? °False Labor  °· The contractions of false labor are usually shorter and not as hard as those of true labor.   °· The contractions are usually irregular.   °· The contractions are often felt in the front of the lower abdomen and in the groin.   °· The contractions may go away when you walk around or change positions while lying down.   °· The contractions get weaker and are shorter lasting as time goes on.   °· The contractions do not usually become progressively stronger, regular, and closer together as with true labor.   °True Labor  °· Contractions in true labor last 30-70 seconds, become very regular, usually become more intense, and increase in frequency.   °· The contractions do not go away with walking.   °· The discomfort is usually felt in the top of the uterus and spreads to the lower abdomen and low back.   °· True labor can be  determined by your health care provider with an exam. This will show that the cervix is dilating and getting thinner.   °WHAT TO REMEMBER °· Keep up with your usual exercises and follow other instructions given by your health care provider.   °· Take medicines as directed by your health care provider.   °· Keep your regular prenatal appointments.   °· Eat and drink lightly if you think you are going into labor.   °· If Braxton Hicks contractions are making you uncomfortable:   °¨ Change your position from lying down or resting to walking, or from walking to resting.   °¨ Sit and rest in a tub of warm water.   °¨ Drink 2-3 glasses of water. Dehydration may cause these contractions.   °¨ Do slow and deep breathing several times an hour.   °WHEN SHOULD I SEEK IMMEDIATE MEDICAL CARE? °Seek immediate medical care if: °· Your contractions become stronger, more regular, and closer together.   °· You have fluid leaking or gushing from your vagina.   °· You have a fever.   °· You pass blood-tinged mucus.   °· You have vaginal bleeding.   °· You have continuous abdominal pain.   °· You have low back pain that you never had before.   °· You feel your baby's head pushing down and causing pelvic pressure.   °· Your baby is not moving as much as it used to.   °This information is not intended to replace advice given to you by your health care provider. Make sure you discuss any questions you have with your health care   provider. °Document Released: 06/27/2005 Document Revised: 10/19/2015 Document Reviewed: 04/08/2013 °Elsevier Interactive Patient Education © 2017 [employer] ° °

## 2016-06-21 NOTE — Progress Notes (Signed)
Pt reports decreased FM x2 weeks.  She felt good FM during NST today.  BID kick count instructions given.

## 2016-06-21 NOTE — Progress Notes (Signed)
   PRENATAL VISIT NOTE  Subjective:  Nicole Gallegos is a 30 y.o. [id number] at 4057w2d being seen today for ongoing prenatal care.  She is currently monitored for the following issues for this high-risk pregnancy and has Anxiety and depression; History of blood transfusion; Asthma, chronic; Panic attacks; Seizures (HCC); Prior pregnancy with fetal demise at 16 weeks; Tension headache, chronic; Migraine; Supervision of high risk pregnancy, antepartum; High risk pregnancy due to history of preterm labor, antepartum; Depression affecting pregnancy, antepartum; Echogenic intracardiac focus of fetus on prenatal ultrasound; and Abnormal fetal ultrasound on her problem list.  Patient reports decreased fetal movement.  Contractions: Irregular. Vag. Bleeding: None.  Movement: (!) Decreased. Denies leaking of fluid.   The following portions of the patient's history were reviewed and updated as appropriate: allergies, current medications, past family history, past medical history, past social history, past surgical history and problem list. Problem list updated.  Objective:   Vitals:   06/21/16 0909  BP: 120/73  Pulse: (!) 105  Weight: 150 lb 1.6 oz (68.1 kg)    Fetal Status: Fetal Heart Rate (bpm): 161 Fundal Height: 36 cm Movement: (!) Decreased     General:  Alert, oriented and cooperative. Patient is in no acute distress.  Skin: Skin is warm and dry. No rash noted.   Cardiovascular: Normal heart rate noted  Respiratory: Normal respiratory effort, no problems with respiration noted  Abdomen: Soft, gravid, appropriate for gestational age. Pain/Pressure: Present     Pelvic:  Cervical exam deferred        Extremities: Normal range of motion.  Edema: Trace  Mental Status: Normal mood and affect. Normal behavior. Normal judgment and thought content.   Assessment and Plan:  Pregnancy: [id number] at 6157w2d  1. History of preterm delivery, currently pregnant in third trimester      Has gotten past 36  weeks!  2. Decreased fetal movements in third trimester, single or unspecified fetus      NST reactive - Fetal nonstress test  3. Supervision of high risk pregnancy, antepartum   Term labor symptoms and general obstetric precautions including but not limited to vaginal bleeding, contractions, leaking of fluid and fetal movement were reviewed in detail with the patient. Please refer to After Visit Summary for other counseling recommendations.  Return in about 1 week (around 06/28/2016) for [facility].   Aviva SignsMarie L Kenden Brandt, CNM

## 2016-06-28 ENCOUNTER — Ambulatory Visit (INDEPENDENT_AMBULATORY_CARE_PROVIDER_SITE_OTHER): Payer: Medicaid Other | Admitting: Obstetrics and Gynecology

## 2016-06-28 VITALS — BP 106/75 | HR 94 | Wt 155.0 lb

## 2016-06-28 DIAGNOSIS — O0990 Supervision of high risk pregnancy, unspecified, unspecified trimester: Secondary | ICD-10-CM

## 2016-06-28 DIAGNOSIS — O0993 Supervision of high risk pregnancy, unspecified, third trimester: Secondary | ICD-10-CM

## 2016-06-28 NOTE — Patient Instructions (Signed)
Safe Medications in Pregnancy   Colds/Coughs/Allergies:  Benadryl (alcohol free) 25 mg every 6 hours as needed  Breath right strips  Claritin  Cepacol throat lozenges  Chloraseptic throat spray  Cold-Eeze- up to three times per day  Cough drops, alcohol free  Flonase (by prescription only)  Guaifenesin  Mucinex  Robitussin DM (plain only, alcohol free)  Saline nasal spray/drops  Sudafed (pseudoephedrine) & Actifed * use only after [redacted] weeks gestation and if you do not have high blood pressure  Tylenol  Vicks Vaporub  Zinc lozenges  Zyrtec   Constipation:  Colace  Ducolax suppositories  Fleet enema  Glycerin suppositories  Metamucil  Milk of magnesia  Miralax  Senokot  Smooth move tea   Diarrhea:  Kaopectate  Imodium A-D   *NO pepto Bismol   Leg Cramps:  Tums  MagGel   Nausea/Vomiting:  Bonine  Dramamine  Emetrol  Ginger extract  Sea bands  Meclizine  Nausea medication to take during pregnancy:  Unisom (doxylamine succinate 25 mg tablets) Take one tablet daily at bedtime. If symptoms are not adequately controlled, the dose can be increased to a maximum recommended dose of two tablets daily (1/2 tablet in the morning, 1/2 tablet mid-afternoon and one at bedtime).  Vitamin B6 100mg  tablets. Take one tablet twice a day (up to 200 mg per day).   Skin Rashes:  Aveeno products  Benadryl cream or 25mg  every 6 hours as needed  Calamine Lotion  1% cortisone cream   Yeast infection:  Gyne-lotrimin 7  Monistat 7    **If taking multiple medications, please check labels to avoid duplicating the same active ingredients  **take medication as directed on the label  ** Do not exceed 4000 mg of tylenol in 24 hours  **Do not take medications that contain aspirin or ibuprofen

## 2016-06-28 NOTE — Progress Notes (Signed)
   PRENATAL VISIT NOTE  Subjective:  Nicole Gallegos is a 30 y.o. [phone] at 2259w2d being seen today for ongoing prenatal care.  She is currently monitored for the following issues for this high-risk pregnancy and has Anxiety and depression; History of blood transfusion; Asthma, chronic; Panic attacks; Seizures (HCC); Prior pregnancy with fetal demise at 16 weeks; Tension headache, chronic; Migraine; Supervision of high risk pregnancy, antepartum; High risk pregnancy due to history of preterm labor, antepartum; Depression affecting pregnancy, antepartum; Echogenic intracardiac focus of fetus on prenatal ultrasound; and Abnormal fetal ultrasound on her problem list.  Patient reports no complaints.  Contractions: Irregular. Vag. Bleeding: None.  Movement: Present. Denies leaking of fluid.   The following portions of the patient's history were reviewed and updated as appropriate: allergies, current medications, past family history, past medical history, past social history, past surgical history and problem list. Problem list updated.  Objective:   Vitals:   06/28/16 0941  BP: 106/75  Pulse: 94  Weight: 155 lb (70.3 kg)    Fetal Status: Fetal Heart Rate (bpm): 154   Movement: Present     General:  Alert, oriented and cooperative. Patient is in no acute distress.  Skin: Skin is warm and dry. No rash noted.   Cardiovascular: Normal heart rate noted  Respiratory: Normal respiratory effort, no problems with respiration noted  Abdomen: Soft, gravid, appropriate for gestational age. Pain/Pressure: Present     Pelvic:  Cervical exam performed      1.5/thick/-3  Extremities: Normal range of motion.  Edema: Trace  Mental Status: Normal mood and affect. Normal behavior. Normal judgment and thought content.   Assessment and Plan:  Pregnancy: [id number] at 6259w2d  1. Supervision of high risk pregnancy, antepartum -routine care. No concerns -GBS negative -labor precautions given -still plans for  tubal  Term labor symptoms and general obstetric precautions including but not limited to vaginal bleeding, contractions, leaking of fluid and fetal movement were reviewed in detail with the patient. Please refer to After Visit Summary for other counseling recommendations.  No Follow-up on file.   Lorne SkeensNicholas Michael Schenk, MD

## 2016-07-01 ENCOUNTER — Inpatient Hospital Stay (HOSPITAL_COMMUNITY)
Admission: AD | Admit: 2016-07-01 | Discharge: 2016-07-03 | DRG: 767 | Disposition: A | Discharge status: Home / Self Care | Payer: Medicaid Other | Source: Ambulatory Visit | Attending: Obstetrics and Gynecology | Admitting: Obstetrics and Gynecology

## 2016-07-01 ENCOUNTER — Encounter (HOSPITAL_COMMUNITY): Payer: Self-pay

## 2016-07-01 DIAGNOSIS — Z833 Family history of diabetes mellitus: Secondary | ICD-10-CM

## 2016-07-01 DIAGNOSIS — O283 Abnormal ultrasonic finding on antenatal screening of mother: Secondary | ICD-10-CM

## 2016-07-01 DIAGNOSIS — O09219 Supervision of pregnancy with history of pre-term labor, unspecified trimester: Secondary | ICD-10-CM

## 2016-07-01 DIAGNOSIS — Z302 Encounter for sterilization: Secondary | ICD-10-CM

## 2016-07-01 DIAGNOSIS — J45909 Unspecified asthma, uncomplicated: Secondary | ICD-10-CM | POA: Diagnosis present

## 2016-07-01 DIAGNOSIS — O0990 Supervision of high risk pregnancy, unspecified, unspecified trimester: Secondary | ICD-10-CM

## 2016-07-01 DIAGNOSIS — F329 Major depressive disorder, single episode, unspecified: Secondary | ICD-10-CM

## 2016-07-01 DIAGNOSIS — M419 Scoliosis, unspecified: Secondary | ICD-10-CM | POA: Diagnosis present

## 2016-07-01 DIAGNOSIS — Z8249 Family history of ischemic heart disease and other diseases of the circulatory system: Secondary | ICD-10-CM

## 2016-07-01 DIAGNOSIS — F32A Depression, unspecified: Secondary | ICD-10-CM

## 2016-07-01 DIAGNOSIS — Z3A37 37 weeks gestation of pregnancy: Secondary | ICD-10-CM

## 2016-07-01 DIAGNOSIS — O99340 Other mental disorders complicating pregnancy, unspecified trimester: Secondary | ICD-10-CM

## 2016-07-01 DIAGNOSIS — O9952 Diseases of the respiratory system complicating childbirth: Principal | ICD-10-CM | POA: Diagnosis present

## 2016-07-01 NOTE — MAU Note (Signed)
Contractions getting stronger at 9 pm.  Was 1.5 cm in office.  No bleeding. ?Leaking at 5 pm and then in lobby.  Baby moving well.

## 2016-07-02 ENCOUNTER — Encounter (HOSPITAL_COMMUNITY)
Admission: AD | Disposition: A | Discharge status: Home / Self Care | Payer: Self-pay | Source: Ambulatory Visit | Attending: Obstetrics and Gynecology

## 2016-07-02 ENCOUNTER — Encounter (HOSPITAL_COMMUNITY): Payer: Self-pay

## 2016-07-02 ENCOUNTER — Inpatient Hospital Stay (HOSPITAL_COMMUNITY): Payer: Medicaid Other | Admitting: Anesthesiology

## 2016-07-02 DIAGNOSIS — Z302 Encounter for sterilization: Secondary | ICD-10-CM | POA: Diagnosis present

## 2016-07-02 DIAGNOSIS — Z833 Family history of diabetes mellitus: Secondary | ICD-10-CM | POA: Diagnosis not present

## 2016-07-02 DIAGNOSIS — Z8249 Family history of ischemic heart disease and other diseases of the circulatory system: Secondary | ICD-10-CM | POA: Diagnosis not present

## 2016-07-02 DIAGNOSIS — Z3493 Encounter for supervision of normal pregnancy, unspecified, third trimester: Secondary | ICD-10-CM | POA: Diagnosis present

## 2016-07-02 DIAGNOSIS — Z3A37 37 weeks gestation of pregnancy: Secondary | ICD-10-CM | POA: Diagnosis not present

## 2016-07-02 DIAGNOSIS — M419 Scoliosis, unspecified: Secondary | ICD-10-CM | POA: Diagnosis present

## 2016-07-02 DIAGNOSIS — J45909 Unspecified asthma, uncomplicated: Secondary | ICD-10-CM | POA: Diagnosis present

## 2016-07-02 DIAGNOSIS — O9952 Diseases of the respiratory system complicating childbirth: Secondary | ICD-10-CM | POA: Diagnosis present

## 2016-07-02 HISTORY — PX: TUBAL LIGATION: SHX77

## 2016-07-02 LAB — CBC
HEMATOCRIT: 35.3 % — AB (ref 36.0–46.0)
HEMOGLOBIN: 11.8 g/dL — AB (ref 12.0–15.0)
MCH: 27.8 pg (ref 26.0–34.0)
MCHC: 33.4 g/dL (ref 30.0–36.0)
MCV: 83.1 fL (ref 78.0–100.0)
PLATELETS: 221 10*3/uL (ref 150–400)
RBC: 4.25 MIL/uL (ref 3.87–5.11)
RDW: 14.5 % (ref 11.5–15.5)
WBC: 7.3 10*3/uL (ref 4.0–10.5)

## 2016-07-02 LAB — TYPE AND SCREEN
ABO/RH(D): AB POS
ANTIBODY SCREEN: NEGATIVE

## 2016-07-02 LAB — RPR: RPR Ser Ql: NONREACTIVE

## 2016-07-02 LAB — GLUCOSE, CAPILLARY: GLUCOSE-CAPILLARY: 71 mg/dL (ref 65–99)

## 2016-07-02 LAB — POCT FERN TEST: POCT FERN TEST: NEGATIVE

## 2016-07-02 SURGERY — LIGATION, FALLOPIAN TUBE, POSTPARTUM
Anesthesia: General | Site: Abdomen | Laterality: Bilateral

## 2016-07-02 MED ORDER — ONDANSETRON HCL 4 MG/2ML IJ SOLN: 4.0000 mg | INTRAMUSCULAR | Status: DC | PRN

## 2016-07-02 MED ORDER — ONDANSETRON HCL 4 MG/2ML IJ SOLN: 4.0000 mg | Freq: Four times a day (QID) | INTRAMUSCULAR | Status: DC | PRN

## 2016-07-02 MED ORDER — IBUPROFEN 600 MG PO TABS
600.0000 mg | ORAL_TABLET | Freq: Four times a day (QID) | ORAL | Status: DC
  Administered 2016-07-02 – 2016-07-03 (×5): 600 mg via ORAL
  Filled 2016-07-02 (×5): qty 1

## 2016-07-02 MED ORDER — BUPIVACAINE HCL (PF) 0.5 % IJ SOLN
INTRAMUSCULAR | Status: AC
  Filled 2016-07-02: qty 30

## 2016-07-02 MED ORDER — OXYTOCIN 40 UNITS IN LACTATED RINGERS INFUSION - SIMPLE MED
2.5000 [IU]/h | INTRAVENOUS | Status: DC
  Filled 2016-07-02: qty 1000

## 2016-07-02 MED ORDER — GLYCOPYRROLATE 0.2 MG/ML IJ SOLN
INTRAMUSCULAR | Status: DC | PRN
  Administered 2016-07-02: 0.1 mg via INTRAVENOUS

## 2016-07-02 MED ORDER — LACTATED RINGERS IV SOLN
INTRAVENOUS | Status: DC | PRN
  Administered 2016-07-02 (×2): via INTRAVENOUS

## 2016-07-02 MED ORDER — PRENATAL MULTIVITAMIN CH
1.0000 | ORAL_TABLET | Freq: Every day | ORAL | Status: DC
  Administered 2016-07-03: 1 via ORAL
  Filled 2016-07-02: qty 1

## 2016-07-02 MED ORDER — HYDROCODONE-ACETAMINOPHEN 7.5-325 MG PO TABS
1.0000 | ORAL_TABLET | Freq: Once | ORAL | Status: AC | PRN
  Administered 2016-07-02: 1 via ORAL

## 2016-07-02 MED ORDER — LACTATED RINGERS IV SOLN: 500.0000 mL | INTRAVENOUS | Status: DC | PRN

## 2016-07-02 MED ORDER — LIDOCAINE HCL (PF) 1 % IJ SOLN
30.0000 mL | INTRAMUSCULAR | Status: DC | PRN
  Filled 2016-07-02: qty 30

## 2016-07-02 MED ORDER — BUPIVACAINE HCL (PF) 0.5 % IJ SOLN
INTRAMUSCULAR | Status: DC | PRN
  Administered 2016-07-02: 2 mL
  Administered 2016-07-02: 8 mL

## 2016-07-02 MED ORDER — METOCLOPRAMIDE HCL 10 MG PO TABS
10.0000 mg | ORAL_TABLET | Freq: Once | ORAL | Status: AC
  Administered 2016-07-02: 10 mg via ORAL
  Filled 2016-07-02: qty 1

## 2016-07-02 MED ORDER — ONDANSETRON HCL 4 MG/2ML IJ SOLN
INTRAMUSCULAR | Status: AC
  Filled 2016-07-02: qty 2

## 2016-07-02 MED ORDER — DEXAMETHASONE SODIUM PHOSPHATE 4 MG/ML IJ SOLN
INTRAMUSCULAR | Status: DC | PRN
  Administered 2016-07-02: 4 mg via INTRAVENOUS

## 2016-07-02 MED ORDER — FENTANYL CITRATE (PF) 100 MCG/2ML IJ SOLN
INTRAMUSCULAR | Status: DC | PRN
Start: 2016-07-02 — End: 2016-07-02
  Administered 2016-07-02: 100 ug via INTRAVENOUS

## 2016-07-02 MED ORDER — TETANUS-DIPHTH-ACELL PERTUSSIS 5-2.5-18.5 LF-MCG/0.5 IM SUSP: 0.5000 mL | Freq: Once | INTRAMUSCULAR | Status: DC

## 2016-07-02 MED ORDER — BUDESONIDE 180 MCG/ACT IN AEPB
2.0000 | INHALATION_SPRAY | Freq: Two times a day (BID) | RESPIRATORY_TRACT | Status: DC
Start: 2016-07-02 — End: 2016-07-02
  Filled 2016-07-02: qty 1

## 2016-07-02 MED ORDER — OXYTOCIN BOLUS FROM INFUSION
500.0000 mL | Freq: Once | INTRAVENOUS | Status: AC
  Administered 2016-07-02: 500 mL via INTRAVENOUS

## 2016-07-02 MED ORDER — DIBUCAINE 1 % RE OINT: 1.0000 "application " | TOPICAL_OINTMENT | RECTAL | Status: DC | PRN

## 2016-07-02 MED ORDER — ACETAMINOPHEN 325 MG PO TABS: 650.0000 mg | ORAL_TABLET | ORAL | Status: DC | PRN

## 2016-07-02 MED ORDER — LIDOCAINE HCL (CARDIAC) 20 MG/ML IV SOLN
INTRAVENOUS | Status: DC | PRN
  Administered 2016-07-02: 100 mg via INTRAVENOUS

## 2016-07-02 MED ORDER — MIDAZOLAM HCL 2 MG/2ML IJ SOLN
INTRAMUSCULAR | Status: AC
  Filled 2016-07-02: qty 2

## 2016-07-02 MED ORDER — SOD CITRATE-CITRIC ACID 500-334 MG/5ML PO SOLN: 30.0000 mL | ORAL | Status: DC | PRN

## 2016-07-02 MED ORDER — ZOLPIDEM TARTRATE 5 MG PO TABS: 5.0000 mg | ORAL_TABLET | Freq: Every evening | ORAL | Status: DC | PRN

## 2016-07-02 MED ORDER — FLEET ENEMA 7-19 GM/118ML RE ENEM: 1.0000 | ENEMA | RECTAL | Status: DC | PRN

## 2016-07-02 MED ORDER — FAMOTIDINE 20 MG PO TABS
40.0000 mg | ORAL_TABLET | Freq: Once | ORAL | Status: AC
Start: 2016-07-02 — End: 2016-07-02
  Administered 2016-07-02: 40 mg via ORAL
  Filled 2016-07-02: qty 2

## 2016-07-02 MED ORDER — HYDROMORPHONE HCL 1 MG/ML IJ SOLN: 0.2500 mg | INTRAMUSCULAR | Status: DC | PRN

## 2016-07-02 MED ORDER — LACTATED RINGERS IV SOLN
INTRAVENOUS | Status: DC
  Administered 2016-07-02: 01:00:00 via INTRAVENOUS

## 2016-07-02 MED ORDER — PROPOFOL 10 MG/ML IV BOLUS
INTRAVENOUS | Status: DC | PRN
  Administered 2016-07-02: 200 mg via INTRAVENOUS

## 2016-07-02 MED ORDER — OXYCODONE-ACETAMINOPHEN 5-325 MG PO TABS: 1.0000 | ORAL_TABLET | ORAL | Status: DC | PRN

## 2016-07-02 MED ORDER — ALBUTEROL SULFATE (2.5 MG/3ML) 0.083% IN NEBU: 3.0000 mL | INHALATION_SOLUTION | Freq: Four times a day (QID) | RESPIRATORY_TRACT | Status: DC | PRN

## 2016-07-02 MED ORDER — KETOROLAC TROMETHAMINE 30 MG/ML IJ SOLN
INTRAMUSCULAR | Status: DC | PRN
  Administered 2016-07-02: 30 mg via INTRAVENOUS

## 2016-07-02 MED ORDER — BUDESONIDE 0.5 MG/2ML IN SUSP
0.5000 mg | Freq: Two times a day (BID) | RESPIRATORY_TRACT | Status: DC
  Administered 2016-07-02 – 2016-07-03 (×2): 0.5 mg via RESPIRATORY_TRACT
  Filled 2016-07-02 (×5): qty 2

## 2016-07-02 MED ORDER — SUCCINYLCHOLINE CHLORIDE 20 MG/ML IJ SOLN
INTRAMUSCULAR | Status: DC | PRN
  Administered 2016-07-02: 100 mg via INTRAVENOUS

## 2016-07-02 MED ORDER — SENNOSIDES-DOCUSATE SODIUM 8.6-50 MG PO TABS
2.0000 | ORAL_TABLET | ORAL | Status: DC
  Administered 2016-07-02: 2 via ORAL
  Filled 2016-07-02: qty 2

## 2016-07-02 MED ORDER — BENZOCAINE-MENTHOL 20-0.5 % EX AERO: 1.0000 "application " | INHALATION_SPRAY | CUTANEOUS | Status: DC | PRN

## 2016-07-02 MED ORDER — METOCLOPRAMIDE HCL 5 MG/ML IJ SOLN: 10.0000 mg | Freq: Once | INTRAMUSCULAR | Status: DC | PRN

## 2016-07-02 MED ORDER — WITCH HAZEL-GLYCERIN EX PADS: 1.0000 "application " | MEDICATED_PAD | CUTANEOUS | Status: DC | PRN

## 2016-07-02 MED ORDER — FENTANYL CITRATE (PF) 100 MCG/2ML IJ SOLN
INTRAMUSCULAR | Status: AC
  Filled 2016-07-02: qty 2

## 2016-07-02 MED ORDER — HYDROCODONE-ACETAMINOPHEN 7.5-325 MG PO TABS
ORAL_TABLET | ORAL | Status: AC
  Administered 2016-07-02: 1 via ORAL
  Filled 2016-07-02: qty 1

## 2016-07-02 MED ORDER — MEPERIDINE HCL 25 MG/ML IJ SOLN: 6.2500 mg | INTRAMUSCULAR | Status: DC | PRN

## 2016-07-02 MED ORDER — DIPHENHYDRAMINE HCL 25 MG PO CAPS: 25.0000 mg | ORAL_CAPSULE | Freq: Four times a day (QID) | ORAL | Status: DC | PRN

## 2016-07-02 MED ORDER — OXYCODONE-ACETAMINOPHEN 5-325 MG PO TABS: 2.0000 | ORAL_TABLET | ORAL | Status: DC | PRN

## 2016-07-02 MED ORDER — SIMETHICONE 80 MG PO CHEW
80.0000 mg | CHEWABLE_TABLET | ORAL | Status: DC | PRN
Start: 2016-07-02 — End: 2016-07-03

## 2016-07-02 MED ORDER — HYDROMORPHONE HCL 1 MG/ML IJ SOLN
INTRAMUSCULAR | Status: DC | PRN
Start: 2016-07-02 — End: 2016-07-02
  Administered 2016-07-02: 0.5 mg via INTRAVENOUS

## 2016-07-02 MED ORDER — COCONUT OIL OIL: 1.0000 "application " | TOPICAL_OIL | Status: DC | PRN

## 2016-07-02 MED ORDER — MIDAZOLAM HCL 2 MG/2ML IJ SOLN
INTRAMUSCULAR | Status: DC | PRN
  Administered 2016-07-02: 2 mg via INTRAVENOUS

## 2016-07-02 MED ORDER — DEXAMETHASONE SODIUM PHOSPHATE 4 MG/ML IJ SOLN
INTRAMUSCULAR | Status: AC
  Filled 2016-07-02: qty 1

## 2016-07-02 MED ORDER — ONDANSETRON HCL 4 MG PO TABS
4.0000 mg | ORAL_TABLET | ORAL | Status: DC | PRN
Start: 2016-07-02 — End: 2016-07-03

## 2016-07-02 MED ORDER — FENTANYL CITRATE (PF) 100 MCG/2ML IJ SOLN
100.0000 ug | INTRAMUSCULAR | Status: DC | PRN
  Administered 2016-07-02: 100 ug via INTRAVENOUS
  Filled 2016-07-02: qty 2

## 2016-07-02 MED ORDER — HYDROMORPHONE HCL 1 MG/ML IJ SOLN
INTRAMUSCULAR | Status: AC
  Filled 2016-07-02: qty 1

## 2016-07-02 MED ORDER — ONDANSETRON HCL 4 MG/2ML IJ SOLN
INTRAMUSCULAR | Status: DC | PRN
  Administered 2016-07-02: 4 mg via INTRAVENOUS

## 2016-07-02 MED ORDER — PNEUMOCOCCAL VAC POLYVALENT 25 MCG/0.5ML IJ INJ
0.5000 mL | INJECTION | INTRAMUSCULAR | Status: AC
Start: 2016-07-03 — End: 2016-07-03
  Administered 2016-07-03: 0.5 mL via INTRAMUSCULAR
  Filled 2016-07-02: qty 0.5

## 2016-07-02 MED ORDER — LACTATED RINGERS IV SOLN
INTRAVENOUS | Status: DC
  Administered 2016-07-02: 15 mL/h via INTRAVENOUS

## 2016-07-02 SURGICAL SUPPLY — 25 items
BENZOIN TINCTURE PRP APPL 2/3 (GAUZE/BANDAGES/DRESSINGS) IMPLANT
BLADE SURG 11 STRL SS (BLADE) ×2 IMPLANT
CLIP FILSHIE TUBAL LIGA STRL (Clip) ×2 IMPLANT
CLOTH BEACON ORANGE TIMEOUT ST (SAFETY) ×2 IMPLANT
DRSG OPSITE POSTOP 3X4 (GAUZE/BANDAGES/DRESSINGS) ×2 IMPLANT
DURAPREP 26ML APPLICATOR (WOUND CARE) ×2 IMPLANT
ELECT REM PT RETURN 9FT ADLT (ELECTROSURGICAL) ×2
ELECTRODE REM PT RTRN 9FT ADLT (ELECTROSURGICAL) ×1 IMPLANT
GLOVE BIOGEL PI IND STRL 7.0 (GLOVE) ×3 IMPLANT
GLOVE BIOGEL PI INDICATOR 7.0 (GLOVE) ×3
GLOVE ECLIPSE 7.0 STRL STRAW (GLOVE) ×2 IMPLANT
GOWN STRL REUS W/TWL LRG LVL3 (GOWN DISPOSABLE) ×4 IMPLANT
NEEDLE HYPO 22GX1.5 SAFETY (NEEDLE) ×2 IMPLANT
NS IRRIG 1000ML POUR BTL (IV SOLUTION) ×2 IMPLANT
PACK ABDOMINAL MINOR (CUSTOM PROCEDURE TRAY) ×2 IMPLANT
PENCIL BUTTON HOLSTER BLD 10FT (ELECTRODE) ×2 IMPLANT
PROTECTOR NERVE ULNAR (MISCELLANEOUS) ×2 IMPLANT
SPONGE LAP 4X18 X RAY DECT (DISPOSABLE) IMPLANT
SUT VIC AB 0 CT1 27 (SUTURE) ×1
SUT VIC AB 0 CT1 27XBRD ANBCTR (SUTURE) ×1 IMPLANT
SUT VICRYL 4-0 PS2 18IN ABS (SUTURE) ×2 IMPLANT
SYR CONTROL 10ML LL (SYRINGE) ×2 IMPLANT
TOWEL OR 17X24 6PK STRL BLUE (TOWEL DISPOSABLE) ×4 IMPLANT
TRAY FOLEY CATH SILVER 14FR (SET/KITS/TRAYS/PACK) ×2 IMPLANT
WATER STERILE IRR 1000ML POUR (IV SOLUTION) ×2 IMPLANT

## 2016-07-02 NOTE — Anesthesia Procedure Notes (Signed)
Procedure Name: Intubation Date/Time: 07/02/2016 11:18 AM Performed by: Graciela HusbandsFUSSELL, Cason Luffman O Pre-anesthesia Checklist: Patient identified, Emergency Drugs available, Suction available, Patient being monitored and Timeout performed Patient Re-evaluated:Patient Re-evaluated prior to inductionOxygen Delivery Method: Circle system utilized Preoxygenation: Pre-oxygenation with 100% oxygen Intubation Type: Rapid sequence, IV induction and Cricoid Pressure applied Laryngoscope Size: Mac and 3 Grade View: Grade I Tube type: Oral Tube size: 7.0 mm Number of attempts: 1 Airway Equipment and Method: Stylet Secured at: 21 cm Tube secured with: Tape Dental Injury: Teeth and Oropharynx as per pre-operative assessment

## 2016-07-02 NOTE — Clinical Social Work Maternal (Signed)
  CLINICAL SOCIAL WORK MATERNAL/CHILD NOTE  Patient Details  Name: Nicole Gallegos MRN: [id number] Date of Birth: [birth year]  Date:  07/02/2016  Clinical Social Worker Initiating Note:  Ferdinand Lango Sheree Lalla, MSW, LCSW-A  Date/ Time Initiated:  07/02/16/1519     Child's Name:  Unknow to this Probation officer at this time.    Legal Guardian:  Other (Comment) (Not established by court system; [name] and FOB parent collectively )   Need for Interpreter:  None   Date of Referral:  07/02/16     Reason for Referral:  Other (Comment) ([name] hx of anxiety/depression, trauma in childhood )   Referral Source:  [employer]   Address:  [place], [place] [address]  Phone number:  [phone]   Household Members:  Self, Minor Children, Spouse   Natural Supports (not living in the home):  Friends, Immediate Family, Extended Family   Professional Supports: None   Employment:     Type of Work:     Education:      Pensions consultant:  [employer]   Other Resources:      Cultural/Religious Considerations Which May Impact Care:  None reported at this time   Strengths:  Ability to meet basic needs , Compliance with medical plan , Home prepared for child , Pediatrician chosen  ([facility] )   Risk Factors/Current Problems:  None   Cognitive State:  Alert , Able to Concentrate , Goal Oriented , Insightful    Mood/Affect:  Calm , Comfortable , Interested    CSW Assessment: CSW met with [name] at bedside to complete assessment. At this time, [name] was resting in bed with visitors present in room. With [name]'s permission, this writer introduced herself and explained role and reasoning for visit being due to her history of anxiety and depression. At this time, [name] notes she does have a history; however, has not had any symptoms in years. She further notes she is no longer on medications since 2009. This Probation officer praised [name] for continued management of her symptoms. This Probation officer discussed PPD  and SIDS with [name]. At this time, [name] verbalizes understanding and knowledge of both as she has a young daughter. At this time, [name] notes she has no further needs thus case closed to this CSW.   CSW Plan/Description:  No Further Intervention Required/No Barriers to Discharge   Oda Cogan, MSW, [facility]  Office: [phone]

## 2016-07-02 NOTE — Progress Notes (Signed)
Faculty Practice OB/GYN Attending Note  30 y.o. [id number] PPD#1 s/p SVD who desires permanent sterilization.  Other reversible forms of contraception were discussed with patient; she declines all other modalities. Risks of procedure discussed with patient including but not limited to: risk of regret, permanence of method, bleeding, infection, injury to surrounding organs and need for additional procedures.  Failure risk of 1-2 % with increased risk of ectopic gestation if pregnancy occurs was also discussed with patient.  Patient verbalized understanding of these risks and wants to proceed with sterilization.  Written informed consent obtained.  To OR when ready.   Jaynie CollinsUGONNA  Madilyne Tadlock, MD, FACOG Attending Obstetrician & Gynecologist Faculty Practice, [facility] [place]

## 2016-07-02 NOTE — Op Note (Signed)
Nicole Gallegos 07/01/2016 - 07/02/2016  PREOPERATIVE DIAGNOSES: Multiparity, undesired fertility  POSTOPERATIVE DIAGNOSES: Multiparity, undesired fertility  PROCEDURE:  Postpartum Bilateral Tubal Sterilization using Filshie Clips   SURGEONS: Dr.  Jaynie CollinsUgonna Anyanwu and Dr. Jen MowElizabeth Mumaw  ANESTHESIA:  General and local analgesia using 10 ml of 0.5% Marcaine  COMPLICATIONS:  None immediate.  ESTIMATED BLOOD LOSS: 2 ml.  FLUIDS: 1000 ml LR.  INDICATIONS:  30 y.o. [id number] with undesired fertility,status post vaginal delivery, desires permanent sterilization.  Other reversible forms of contraception were discussed with patient; she declines all other modalities. Risks of procedure discussed with patient including but not limited to: risk of regret, permanence of method, bleeding, infection, injury to surrounding organs and need for additional procedures.  Failure risk of 1 -2 % with increased risk of ectopic gestation if pregnancy occurs was also discussed with patient.      FINDINGS:  Normal uterus, tubes, and ovaries.  PROCEDURE DETAILS: The patient was taken to the operating room where her epidural anesthesia was dosed up to surgical level and found to be adequate.  She was then placed in the dorsal supine position and prepped and draped in sterile fashion.  After an adequate timeout was performed, attention was turned to the patient's abdomen where a small transverse skin incision was made under the umbilical fold. The incision was taken down to the layer of fascia using the scalpel, and fascia was incised, and extended bilaterally using Mayo scissors. The peritoneum was entered in a sharp fashion. Attention was then turned to the patient's uterus, and left fallopian tube was identified and followed out to the fimbriated end.  A Filshie clip was placed on the left fallopian tube about 3 cm from the cornual attachment, with care given to incorporate the underlying mesosalpinx.  A similar process  was carried out on the right side allowing for bilateral tubal sterilization.  Good hemostasis was noted overall.  Local analgesia was injected into both Filshie application sites.The instruments were then removed from the patient's abdomen and the fascial incision was repaired with 0 Vicryl, and the skin was closed with a 4-0 Vicryl subcuticular stitch. The patient tolerated the procedure well.  Instrument, sponge, and needle counts were correct times two.  The patient was then taken to the recovery room awake and in stable condition.   Jaynie CollinsUGONNA  ANYANWU, MD, FACOG Attending Obstetrician & Gynecologist Faculty Practice, [facility] [place]

## 2016-07-02 NOTE — Anesthesia Postprocedure Evaluation (Signed)
Anesthesia Post Note  Patient: Lutricia Feileirdre Brown  Procedure(s) Performed: Procedure(s) (LRB): POST PARTUM TUBAL LIGATION with filshie clips (Bilateral)  Patient location during evaluation: PACU Anesthesia Type: General Level of consciousness: awake and alert Pain management: pain level controlled Vital Signs Assessment: post-procedure vital signs reviewed and stable Respiratory status: spontaneous breathing, nonlabored ventilation and respiratory function stable Cardiovascular status: blood pressure returned to baseline and stable Postop Assessment: no signs of nausea or vomiting Anesthetic complications: no        Last Vitals:  Vitals:   07/02/16 1218 07/02/16 1230  BP: 91/70 108/75  Pulse: 81 73  Resp: 16 18  Temp: 36.6 C     Last Pain:  Vitals:   07/02/16 1230  TempSrc:   PainSc: 2    Pain Goal: Patients Stated Pain Goal: 0 (07/01/16 2350)               Miche Loughridge A.

## 2016-07-02 NOTE — Transfer of Care (Signed)
Immediate Anesthesia Transfer of Care Note  Patient: Nicole Gallegos  Procedure(s) Performed: Procedure(s): POST PARTUM TUBAL LIGATION with filshie clips (Bilateral)  Patient Location: PACU  Anesthesia Type:General  Level of Consciousness: awake, alert  and oriented  Airway & Oxygen Therapy: Patient Spontanous Breathing and Patient connected to nasal cannula oxygen  Post-op Assessment: Report given to RN and Post -op Vital signs reviewed and stable  Post vital signs: Reviewed and stable  Last Vitals:  Vitals:   07/02/16 0904 07/02/16 1059  BP: 117/69 (P) 101/71  Pulse: 77 (P) 87  Resp: 20 (P) 20  Temp: 36.6 C (P) 36.7 C    Last Pain:  Vitals:   07/02/16 1059  TempSrc: (P) Oral  PainSc:       Patients Stated Pain Goal: 0 (07/01/16 2350)  Complications: No apparent anesthesia complications

## 2016-07-02 NOTE — Anesthesia Preprocedure Evaluation (Addendum)
Anesthesia Evaluation  Patient identified by MRN, date of birth, ID band Patient awake    Reviewed: Allergy & Precautions, NPO status , Patient's Chart, lab work & pertinent test results  Airway Mallampati: I  TM Distance: >3 FB Neck ROM: Full    Dental no notable dental hx. (+) Teeth Intact   Pulmonary asthma ,    Pulmonary exam normal breath sounds clear to auscultation       Cardiovascular negative cardio ROS Normal cardiovascular exam Rhythm:Regular Rate:Normal     Neuro/Psych  Headaches, Seizures -, Well Controlled,  PSYCHIATRIC DISORDERS Anxiety Depression Hx/o panic attacks   GI/Hepatic negative GI ROS, Neg liver ROS,   Endo/Other  diabetes, Poorly Controlled, Type 2  Renal/GU negative Renal ROS  negative genitourinary   Musculoskeletal Scoliosis   Abdominal   Peds  Hematology  (+) anemia , Hx/o blood transfusion- age 89 , accident   Anesthesia Other Findings   Reproductive/Obstetrics                            Lab Results  Component Value Date   WBC 7.3 07/02/2016   HGB 11.8 (L) 07/02/2016   HCT 35.3 (L) 07/02/2016   MCV 83.1 07/02/2016   PLT 221 07/02/2016     Chemistry      Component Value Date/Time   NA 136 12/30/2015 1127   K 4.0 12/30/2015 1127   CL 104 12/30/2015 1127   CO2 26 12/30/2015 1127   BUN 8 12/30/2015 1127   CREATININE 0.54 12/30/2015 1127      Component Value Date/Time   CALCIUM 9.4 12/30/2015 1127   ALKPHOS 60 12/30/2015 1127   AST 16 12/30/2015 1127   ALT 14 12/30/2015 1127   BILITOT 0.4 12/30/2015 1127     Lab Results  Component Value Date   WBC 7.3 07/02/2016   HGB 11.8 (L) 07/02/2016   HCT 35.3 (L) 07/02/2016   MCV 83.1 07/02/2016   PLT 221 07/02/2016    Anesthesia Physical Anesthesia Plan  ASA: II  Anesthesia Plan: General   Post-op Pain Management:    Induction: Intravenous, Rapid sequence and Cricoid pressure planned  Airway  Management Planned: Oral ETT  Additional Equipment:   Intra-op Plan:   Post-operative Plan: Extubation in OR  Informed Consent: I have reviewed the patients History and Physical, chart, labs and discussed the procedure including the risks, benefits and alternatives for the proposed anesthesia with the patient or authorized representative who has indicated his/her understanding and acceptance.   Dental advisory given  Plan Discussed with: CRNA, Surgeon and Anesthesiologist  Anesthesia Plan Comments:        Anesthesia Quick Evaluation

## 2016-07-02 NOTE — Lactation Note (Signed)
This note was copied from a baby's chart. Lactation Consultation Note  Patient Name: Nicole Gallegos BJYNW'GToday's Date: 07/02/2016 Reason for consult: Initial assessment   Mom holding infant when LC walked into the room.  Mom explained that the baby was chewing when she was trying to feed for a few minutes after delivery.  She expressed that she did not want to breastfeed the baby. Mom said her prior experience breastfeeding her middle child was unsuccessful.    LC informed mom of benefits of breastfeeding, mom actively listened and thanked LC for providing her with brochures and support group info about lactation services in case mom changed her mind and wanted to BF. LC told mom to call out if she had any questions about BF or pumping BM for her baby.   Maternal Data Formula Feeding for Exclusion: Yes Reason for exclusion: Mother's choice to formula and breast feed on admission (Mom told LC she tried right after delivery but the baby was chewing and she wished now to only formula feed)  Feeding Feeding Type: Formula Nipple Type: Slow - flow  LATCH Score/Interventions                      Lactation Tools Discussed/Used     Consult Status      Nicole Gallegos 07/02/2016, 5:19 PM
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
## 2016-07-03 NOTE — Discharge Instructions (Signed)

## 2016-07-03 NOTE — Anesthesia Postprocedure Evaluation (Signed)
Anesthesia Post Note  Patient: Nicole Gallegos  Procedure(s) Performed: Procedure(s) (LRB): POST PARTUM TUBAL LIGATION with filshie clips (Bilateral)  Patient location during evaluation: Mother Baby Anesthesia Type: General Level of consciousness: awake and alert Pain management: satisfactory to patient Vital Signs Assessment: post-procedure vital signs reviewed and stable Respiratory status: spontaneous breathing and respiratory function stable Cardiovascular status: stable Postop Assessment: adequate PO intake Anesthetic complications: no        Last Vitals:  Vitals:   07/03/16 0245 07/03/16 0639  BP: 114/73 114/75  Pulse: 73 64  Resp: 18 20  Temp: 36.6 C 36.7 C    Last Pain:  Vitals:   07/03/16 0639  TempSrc: Oral  PainSc:    Pain Goal: Patients Stated Pain Goal: 0 (07/01/16 2350)               [name]

## 2016-07-03 NOTE — Addendum Note (Signed)
Addendum  created 07/03/16 [id number] by Graciela HusbandsWynn O [place] Dani, CRNA   Sign clinical note

## 2016-07-03 NOTE — Discharge Summary (Signed)
OB Discharge Summary     Patient Name: Nicole Gallegos DOB: 1986/03/07 MRN: [id number]  Date of admission: 07/01/2016 Delivering MD: Lorne SkeensSCHENK, NICHOLAS MICHAEL   Date of discharge: 07/03/2016  Admitting diagnosis: 37wks, contractions Desies sterilization Intrauterine pregnancy: 5925w6d     Secondary diagnosis:  Active Problems:   Normal labor  Additional problems: none     Discharge diagnosis: Preterm Pregnancy Delivered                                                                                                Post partum procedures:postpartum tubal ligation  Augmentation: none  Complications: None  Hospital course:  Onset of Labor With Vaginal Delivery     30 y.o. yo [id number] at 7225w6d was admitted in Active Labor on 07/01/2016. Patient had an uncomplicated labor course as follows:  Membrane Rupture Time/Date: 1:22 AM ,07/02/2016   Intrapartum Procedures: Episiotomy: None [1]                                         Lacerations:  None [1]  Patient had a delivery of a Viable infant. 07/02/2016  Information for the patient's newborn:  Jerel ShepherdBrown, Boy Idella [[id number]][[id number]]  Delivery Method: Vaginal, Spontaneous Delivery (Filed from Delivery Summary)    Pateint had an uncomplicated postpartum course.  She is ambulating, tolerating a regular diet, passing flatus, and urinating well. Patient is discharged home in stable condition on 07/03/16.    Physical exam Vitals:   07/02/16 1819 07/02/16 2245 07/03/16 0245 07/03/16 0639  BP: 110/70 113/73 114/73 114/75  Pulse: 74 64 73 64  Resp: 18 18 18 20   Temp: 97.4 F (36.3 C) 98 F (36.7 C) 97.9 F (36.6 C) 98 F (36.7 C)  TempSrc: Oral Oral Oral Oral  SpO2:      Weight:      Height:       General: alert, cooperative and no distress Lochia: appropriate Uterine Fundus: firm Incision: Healing well with no significant drainage, No significant erythema, Dressing is clean, dry, and intact DVT Evaluation: No evidence of DVT seen on  physical exam. Labs: Lab Results  Component Value Date   WBC 7.3 07/02/2016   HGB 11.8 (L) 07/02/2016   HCT 35.3 (L) 07/02/2016   MCV 83.1 07/02/2016   PLT 221 07/02/2016   CMP Latest Ref Rng & Units 12/30/2015  Glucose 65 - 99 mg/dL 82  BUN 6 - 20 mg/dL 8  Creatinine 5.780.44 - 4.691.00 mg/dL 6.290.54  Sodium 528135 - 413145 mmol/L 136  Potassium 3.5 - 5.1 mmol/L 4.0  Chloride 101 - 111 mmol/L 104  CO2 22 - 32 mmol/L 26  Calcium 8.9 - 10.3 mg/dL 9.4  Total Protein 6.5 - 8.1 g/dL 6.7  Total Bilirubin 0.3 - 1.2 mg/dL 0.4  Alkaline Phos 38 - 126 U/L 60  AST 15 - 41 U/L 16  ALT 14 - 54 U/L 14    Discharge instruction: per After Visit Summary and "Baby and Me Booklet".  After visit  meds:  Allergies as of 07/03/2016      Reactions   Chocolate Hives      Medication List    TAKE these medications   acetaminophen 325 MG tablet Commonly known as:  TYLENOL Take 325 mg by mouth every 6 (six) hours as needed for mild pain, moderate pain or headache.   albuterol 108 (90 Base) MCG/ACT inhaler Commonly known as:  PROVENTIL HFA;VENTOLIN HFA Inhale 2 puffs into the lungs every 6 (six) hours as needed for wheezing or shortness of breath.   PRENATAL GUMMIES/DHA & FA 0.4-32.5 MG Chew Chew 2 each by mouth daily.       Diet: routine diet  Activity: Advance as tolerated. Pelvic rest for 6 weeks.   Outpatient follow up:6 weeks Follow up Appt:Future Appointments Date Time Provider Department Center  07/05/2016 9:40 AM [place] [place] [facility]   Follow up Visit:No Follow-up on file.  Postpartum contraception: Tubal Ligation  Newborn Data: Live born female  Birth Weight: 6 lb 6.8 oz (2915 g) APGAR: 8, 9  Baby Feeding: Bottle Disposition:home with mother   07/03/2016 Clearance CootsAndrew Tyson, MD

## 2016-07-03 NOTE — Progress Notes (Signed)
Post Partum Day  Subjective: Patient reports feeling well. She denies chest pain, SOB, lightheadedness/dizziness. She has ambulated and voided without issues  Objective: Blood pressure 114/75, pulse 64, temperature 98 F (36.7 C), temperature source Oral, resp. rate 20, height 5\' 7"  (1.702 m), weight 151 lb (68.5 kg), last menstrual period 10/04/2015, SpO2 97 %, unknown if currently breastfeeding.  Physical Exam:  General: alert, cooperative and no distress Lochia: appropriate Uterine Fundus: firm DVT Evaluation: No evidence of DVT seen on physical exam.   Recent Labs  07/02/16 0015  HGB 11.8*  HCT 35.3*    Assessment/Plan: Plan for discharge tomorrow  Continue current care   LOS: 1 day   Everitt Wenner 07/03/2016, 9:51 AM

## 2016-07-05 ENCOUNTER — Encounter: Payer: Self-pay | Admitting: Family Medicine

## 2016-07-05 ENCOUNTER — Encounter (HOSPITAL_COMMUNITY): Payer: Self-pay | Admitting: Obstetrics & Gynecology

## 2016-07-10 ENCOUNTER — Emergency Department (HOSPITAL_COMMUNITY): Payer: Medicaid Other

## 2016-07-10 ENCOUNTER — Inpatient Hospital Stay (HOSPITAL_COMMUNITY)
Admission: EM | Admit: 2016-07-10 | Discharge: 2016-07-12 | DRG: 776 | Disposition: A | Discharge status: Home / Self Care | Payer: Medicaid Other | Attending: Family Medicine | Admitting: Family Medicine

## 2016-07-10 DIAGNOSIS — O152 Eclampsia in the puerperium: Principal | ICD-10-CM | POA: Diagnosis present

## 2016-07-10 DIAGNOSIS — F329 Major depressive disorder, single episode, unspecified: Secondary | ICD-10-CM

## 2016-07-10 DIAGNOSIS — O151 Eclampsia in labor: Secondary | ICD-10-CM | POA: Diagnosis present

## 2016-07-10 DIAGNOSIS — F32A Depression, unspecified: Secondary | ICD-10-CM

## 2016-07-10 DIAGNOSIS — J4520 Mild intermittent asthma, uncomplicated: Secondary | ICD-10-CM

## 2016-07-10 DIAGNOSIS — Z8759 Personal history of other complications of pregnancy, childbirth and the puerperium: Secondary | ICD-10-CM | POA: Diagnosis present

## 2016-07-10 DIAGNOSIS — I10 Essential (primary) hypertension: Secondary | ICD-10-CM

## 2016-07-10 DIAGNOSIS — F410 Panic disorder [episodic paroxysmal anxiety] without agoraphobia: Secondary | ICD-10-CM

## 2016-07-10 DIAGNOSIS — O1500 Eclampsia in pregnancy, unspecified trimester: Secondary | ICD-10-CM

## 2016-07-10 DIAGNOSIS — F419 Anxiety disorder, unspecified: Secondary | ICD-10-CM

## 2016-07-10 DIAGNOSIS — R569 Unspecified convulsions: Secondary | ICD-10-CM

## 2016-07-10 LAB — URINALYSIS, ROUTINE W REFLEX MICROSCOPIC
Bilirubin Urine: NEGATIVE
Glucose, UA: NEGATIVE mg/dL
Hgb urine dipstick: NEGATIVE
Ketones, ur: NEGATIVE mg/dL
Leukocytes, UA: NEGATIVE
Nitrite: NEGATIVE
Protein, ur: NEGATIVE mg/dL
Specific Gravity, Urine: 1.005 (ref 1.005–1.030)
pH: 7 (ref 5.0–8.0)

## 2016-07-10 LAB — COMPREHENSIVE METABOLIC PANEL
ALK PHOS: 78 U/L (ref 38–126)
ALT: 15 U/L (ref 14–54)
AST: 14 U/L — ABNORMAL LOW (ref 15–41)
Albumin: 3 g/dL — ABNORMAL LOW (ref 3.5–5.0)
Anion gap: 7 (ref 5–15)
BILIRUBIN TOTAL: 0.5 mg/dL (ref 0.3–1.2)
BUN: 9 mg/dL (ref 6–20)
CALCIUM: 7.9 mg/dL — AB (ref 8.9–10.3)
CO2: 22 mmol/L (ref 22–32)
CREATININE: 0.64 mg/dL (ref 0.44–1.00)
Chloride: 111 mmol/L (ref 101–111)
Glucose, Bld: 90 mg/dL (ref 65–99)
Potassium: 3.4 mmol/L — ABNORMAL LOW (ref 3.5–5.1)
Sodium: 140 mmol/L (ref 135–145)
Total Protein: 6.2 g/dL — ABNORMAL LOW (ref 6.5–8.1)

## 2016-07-10 LAB — BASIC METABOLIC PANEL
Anion gap: 8 (ref 5–15)
BUN: 12 mg/dL (ref 6–20)
CO2: 24 mmol/L (ref 22–32)
Calcium: 8.9 mg/dL (ref 8.9–10.3)
Chloride: 110 mmol/L (ref 101–111)
Creatinine, Ser: 0.74 mg/dL (ref 0.44–1.00)
GFR calc Af Amer: >60 mL/min (ref >60)
GFR calc non Af Amer: >60 mL/min (ref >60)
Glucose, Bld: 70 mg/dL (ref 65–99)
Potassium: 3.8 mmol/L (ref 3.5–5.1)
Sodium: 142 mmol/L (ref 135–145)

## 2016-07-10 LAB — CBC
HEMATOCRIT: 39.4 % (ref 36.0–46.0)
Hemoglobin: 12.8 g/dL (ref 12.0–15.0)
MCH: 26.8 pg (ref 26.0–34.0)
MCHC: 32.5 g/dL (ref 30.0–36.0)
MCV: 82.4 fL (ref 78.0–100.0)
Platelets: 317 10*3/uL (ref 150–400)
RBC: 4.78 MIL/uL (ref 3.87–5.11)
RDW: 13.8 % (ref 11.5–15.5)
WBC: 5.4 10*3/uL (ref 4.0–10.5)

## 2016-07-10 LAB — PROTEIN / CREATININE RATIO, URINE
Creatinine, Urine: 98 mg/dL
Protein Creatinine Ratio: 0.5 mg/mg{creat} — ABNORMAL HIGH (ref 0.00–0.15)
Total Protein, Urine: 49 mg/dL

## 2016-07-10 LAB — CBG MONITORING, ED: GLUCOSE-CAPILLARY: 76 mg/dL (ref 65–99)

## 2016-07-10 MED ORDER — ZOLPIDEM TARTRATE 5 MG PO TABS
5.0000 mg | ORAL_TABLET | Freq: Every evening | ORAL | Status: DC | PRN
  Administered 2016-07-11: 5 mg via ORAL
  Filled 2016-07-10: qty 1

## 2016-07-10 MED ORDER — LORAZEPAM 2 MG/ML IJ SOLN
1.0000 mg | Freq: Once | INTRAMUSCULAR | Status: AC
  Administered 2016-07-10: 1 mg via INTRAVENOUS
  Filled 2016-07-10: qty 1

## 2016-07-10 MED ORDER — SIMETHICONE 80 MG PO CHEW
80.0000 mg | CHEWABLE_TABLET | Freq: Four times a day (QID) | ORAL | Status: DC | PRN
  Administered 2016-07-10: 80 mg via ORAL
  Filled 2016-07-10: qty 1

## 2016-07-10 MED ORDER — IBUPROFEN 600 MG PO TABS
600.0000 mg | ORAL_TABLET | Freq: Four times a day (QID) | ORAL | Status: DC | PRN
  Administered 2016-07-10: 600 mg via ORAL
  Filled 2016-07-10: qty 1

## 2016-07-10 MED ORDER — HYDRALAZINE HCL 20 MG/ML IJ SOLN: 5.0000 mg | INTRAMUSCULAR | Status: DC | PRN

## 2016-07-10 MED ORDER — MAGNESIUM SULFATE 4 GM/100ML IV SOLN
4.0000 g | Freq: Once | INTRAVENOUS | Status: AC
  Administered 2016-07-10: 4 g via INTRAVENOUS
  Filled 2016-07-10: qty 100

## 2016-07-10 MED ORDER — LABETALOL HCL 5 MG/ML IV SOLN: 20.0000 mg | INTRAVENOUS | Status: DC | PRN

## 2016-07-10 MED ORDER — LACTATED RINGERS IV SOLN
2.0000 g/h | INTRAVENOUS | Status: AC
  Administered 2016-07-10 – 2016-07-11 (×2): 2 g/h via INTRAVENOUS
  Filled 2016-07-10 (×2): qty 80

## 2016-07-10 MED ORDER — HYDRALAZINE HCL 20 MG/ML IJ SOLN
10.0000 mg | Freq: Once | INTRAMUSCULAR | Status: AC
  Administered 2016-07-10: 10 mg via INTRAVENOUS
  Filled 2016-07-10: qty 1

## 2016-07-10 MED ORDER — LACTATED RINGERS IV SOLN
INTRAVENOUS | Status: DC
  Administered 2016-07-11: via INTRAVENOUS

## 2016-07-10 MED ORDER — LACTATED RINGERS IV SOLN
INTRAVENOUS | Status: DC
  Administered 2016-07-10 – 2016-07-11 (×2): via INTRAVENOUS

## 2016-07-10 MED ORDER — MAGNESIUM SULFATE BOLUS VIA INFUSION
2.0000 g/h | Freq: Once | INTRAVENOUS | Status: DC
  Filled 2016-07-10: qty 500

## 2016-07-10 MED ORDER — LABETALOL HCL 5 MG/ML IV SOLN
10.0000 mg | Freq: Once | INTRAVENOUS | Status: AC
  Administered 2016-07-10: 10 mg via INTRAVENOUS
  Filled 2016-07-10: qty 4

## 2016-07-10 MED ORDER — OXYCODONE-ACETAMINOPHEN 5-325 MG PO TABS
1.0000 | ORAL_TABLET | ORAL | Status: DC | PRN
  Administered 2016-07-10 – 2016-07-11 (×3): 1 via ORAL
  Filled 2016-07-10 (×3): qty 1

## 2016-07-10 MED ORDER — SODIUM CHLORIDE 0.9 % IV SOLN
INTRAVENOUS | Status: DC
  Administered 2016-07-10: 11:00:00 via INTRAVENOUS
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
## 2016-07-10 NOTE — ED Triage Notes (Signed)
Pt brought by husband for 2 episodes seizures today, no hx of seizures. Delivered SVD 2 days ago, had hypertension in pregnancy. SOB onset 30 minutes ago. Pt post-ictal. 3rd seizure occurred in ED, witnessed by staff, tonic-clonic.

## 2016-07-10 NOTE — ED Notes (Signed)
Bed: WA17 Expected date:  Expected time:  Means of arrival:  Comments: Res A 

## 2016-07-10 NOTE — ED Provider Notes (Signed)
[facility]-EMERGENCY DEPT Provider Note   CSN: [id number] Arrival date & time: 07/10/16  1043     History   Chief Complaint Chief Complaint  Patient presents with  . Seizures    HPI Nicole Gallegos is a 30 y.o. female.  HPI   30 year old female with seizure-like activity. Recent history significant for spontaneous vaginal delivery. She is G6 P4. [facility] in active labor. Had a spontaneous vaginal delivery on December 23. She had a uncomplicated postpartum course and was discharged home on December 24.   Significant other is at bedside. He reports no known seizure history. Her past medical history does list seizures though and tates that her last seizure was several years ago. She is not currently on any antiepileptics.  He reports that she has seemed to be recovering well since discharge up until this morning. He was sleeping and other children got him on because the patient wasn't acting right. She was awake at that time was complaining of feeling short of breath and some chest pain. She subsequently had what appeared to be generalized tonic-clonic activity. She had a repeat episode prior to my evaluation.  Currently, she is not answering my questions verbally but will shake her head yes/no to simple questions. She shakes her head yes that she is having pain but doesn't localize it. She will move both feet and squeeze my fingers on command.   Past Medical History:  Diagnosis Date  . Asthma    Exercise induced;inhaler prn  . Constipation   . Depression    No meds. currently  . Diabetes type 2, uncontrolled (HCC)   . History of blood transfusion    Accident at 7 yoa and lost a lot of blood  . Migraine   . Panic attacks    No meds currently;used to take meds in 2009  . Preterm labor 2010   2nd child;had to stop labor 2 times;due to stress  . Scoliosis   . Seizures (HCC)    if gets overheated;no meds, last seizure 6 yrs ago    Patient Active  Problem List   Diagnosis Date Noted  . Normal labor 07/02/2016  . Abnormal fetal ultrasound 02/23/2016  . Echogenic intracardiac focus of fetus on prenatal ultrasound 02/19/2016  . Supervision of high risk pregnancy, antepartum 01/13/2016  . High risk pregnancy due to history of preterm labor, antepartum 01/13/2016  . Depression affecting pregnancy, antepartum 01/13/2016  . Tension headache, chronic 04/21/2015  . Migraine 04/21/2015  . Anxiety and depression 09/24/2012  . History of blood transfusion 09/24/2012  . Asthma, chronic 09/24/2012  . Panic attacks 09/24/2012  . Seizures (HCC) 09/24/2012  . Prior pregnancy with fetal demise at 2716 weeks 09/24/2012    Past Surgical History:  Procedure Laterality Date  . DILATION AND CURETTAGE OF UTERUS     Suction D&C to remove 16 wk. fetal demise  . FOOT SURGERY     R foot, repair torn tissue and skin graft  . TUBAL LIGATION Bilateral 07/02/2016   Procedure: POST PARTUM TUBAL LIGATION with filshie clips;  Surgeon: Tereso NewcomerUgonna A Anyanwu, MD;  Location: [facility];  Service: Gynecology;  Laterality: Bilateral;  . WISDOM TOOTH EXTRACTION  2008    OB History    Gravida Para Term Preterm AB Living   6 4 3 1 2 4    SAB TAB Ectopic Multiple Live Births   1 1 0 0 4       Home Medications  Prior to Admission medications   Medication Sig Start Date End Date Taking? Authorizing Provider  acetaminophen (TYLENOL) 325 MG tablet Take 325 mg by mouth every 6 (six) hours as needed for mild pain, moderate pain or headache.     Historical Provider, MD  albuterol (PROVENTIL HFA;VENTOLIN HFA) 108 (90 Base) MCG/ACT inhaler Inhale 2 puffs into the lungs every 6 (six) hours as needed for wheezing or shortness of breath.    Historical Provider, MD  Prenatal MV-Min-FA-Omega-3 (PRENATAL GUMMIES/DHA & FA) 0.4-32.5 MG CHEW Chew 2 each by mouth daily.    Historical Provider, MD    Family History Family History  Problem Relation Age of Onset  . Seizures Father      if gets overheated  . Heart disease Maternal Aunt     Hole in the heart  . Heart disease Maternal Uncle     Hole in the heart  . Heart attack Maternal Grandmother     Deceased  . Hypertension Maternal Grandmother   . Hypertension Maternal Uncle   . Hypertension Paternal Uncle   . Diabetes Paternal Grandmother   . Seizures Daughter     if gets overheated  . Seizures Daughter     if gets overheated  . Bipolar disorder Cousin     maternal  . Depression Cousin     maternal  . Other Daughter     1st child had 2 vein umbilical cord; Pt did weekly NSTs.  . Anesthesia problems Neg Hx     Social History Social History  Substance Use Topics  . Smoking status: Never Smoker  . Smokeless tobacco: Never Used  . Alcohol use No     Comment: Occasioanlly prior to pregnancy     Allergies   Chocolate   Review of Systems Review of Systems  Level 5 caveat because of confusion/post-ictal.   Physical Exam Updated Vital Signs LMP 10/04/2015   Physical Exam  Constitutional: She appears well-developed and well-nourished. No distress.  In bed. Appears to be sleeping.   HENT:  Head: Normocephalic and atraumatic.  Eyes: Conjunctivae are normal. Right eye exhibits no discharge. Left eye exhibits no discharge.  Neck: Neck supple.  Cardiovascular: Normal rate, regular rhythm and normal heart sounds.  Exam reveals no gallop and no friction rub.   No murmur heard. Pulmonary/Chest: Effort normal and breath sounds normal. No respiratory distress.  Abdominal: Soft. She exhibits no distension. There is no tenderness.  Musculoskeletal: She exhibits no edema or tenderness.  Neurological:  Patient is drowsy. Will open her eyes to tactile stimuli and loud voice. Withdraws all extremities equally but is not following commands. Muscle tone seems normal.  Skin: Skin is warm and dry.  Nursing note and vitals reviewed.    ED Treatments / Results  Labs (all labs ordered are listed, but only  abnormal results are displayed) Labs Reviewed  URINALYSIS, ROUTINE W REFLEX MICROSCOPIC - Abnormal; Notable for the following:       Result Value   Color, Urine STRAW (*)    All other components within normal limits  COMPREHENSIVE METABOLIC PANEL - Abnormal; Notable for the following:    Potassium 3.4 (*)    Calcium 7.9 (*)    Total Protein 6.2 (*)    Albumin 3.0 (*)    AST 14 (*)    All other components within normal limits  PROTEIN / CREATININE RATIO, URINE - Abnormal; Notable for the following:    Protein Creatinine Ratio 0.50 (*)    All  other components within normal limits  COMPREHENSIVE METABOLIC PANEL - Abnormal; Notable for the following:    Calcium 6.8 (*)    Total Protein 6.0 (*)    Albumin 3.0 (*)    AST 14 (*)    All other components within normal limits  CBC - Abnormal; Notable for the following:    Hemoglobin 11.9 (*)    All other components within normal limits  BASIC METABOLIC PANEL  CBC  CBG MONITORING, ED  CBG MONITORING, ED    EKG  EKG Interpretation None       Radiology No results found.   Ct Head Wo Contrast  Result Date: 07/10/2016 CLINICAL DATA:  Three separate seizures today.  Recently postpartum. EXAM: CT HEAD WITHOUT CONTRAST TECHNIQUE: Contiguous axial images were obtained from the base of the skull through the vertex without intravenous contrast. COMPARISON:  MRI of the brain on 01/24/2015 FINDINGS: Brain: No evidence of acute infarction, hemorrhage, hydrocephalus, extra-axial collection or mass lesion/mass effect. Vascular: No hyperdense vessel or unexpected calcification. Skull: Normal. Negative for fracture or focal lesion. Sinuses/Orbits: No acute finding. Other: None. IMPRESSION: Normal head CT. Electronically Signed   By: Irish Lack M.D.   On: 07/10/2016 11:25   Dg Chest Portable 1 View  Result Date: 07/10/2016 CLINICAL DATA:  Shortness of breath. Multiple seizures today. Two days postpartum. EXAM: PORTABLE CHEST 1 VIEW  COMPARISON:  Chest x-ray dated 08/24/2015 FINDINGS: The heart size and mediastinal contours are within normal limits. Both lungs are clear. The visualized skeletal structures are unremarkable. IMPRESSION: Normal chest. Electronically Signed   By: Francene Boyers M.D.   On: 07/10/2016 11:15   Procedures Procedures (including critical care time)  CRITICAL CARE Performed by: Raeford Razor Total critical care time:35  minutes Critical care time was exclusive of separately billable procedures and treating other patients. Critical care was necessary to treat or prevent imminent or life-threatening deterioration. Critical care was time spent personally by me on the following activities: development of treatment plan with patient and/or surrogate as well as nursing, discussions with consultants, evaluation of patient's response to treatment, examination of patient, obtaining history from patient or surrogate, ordering and performing treatments and interventions, ordering and review of laboratory studies, ordering and review of radiographic studies, pulse oximetry and re-evaluation of patient's condition.   Medications Ordered in ED Medications  0.9 %  sodium chloride infusion ( Intravenous New Bag/Given 07/10/16 1124)  magnesium sulfate IVPB 4 g 100 mL (4 g Intravenous New Bag/Given 07/10/16 1124)  hydrALAZINE (APRESOLINE) injection 10 mg (not administered)  labetalol (NORMODYNE,TRANDATE) injection 10 mg (10 mg Intravenous Given 07/10/16 1124)  LORazepam (ATIVAN) injection 1 mg (1 mg Intravenous Given 07/10/16 1124)     Initial Impression / Assessment and Plan / ED Course  I have reviewed the triage vital signs and the nursing notes.  Pertinent labs & imaging results that were available during my care of the patient were reviewed by me and considered in my medical decision making (see chart for details).  Clinical Course     30 year old female with hypertension and seizures. 8 days postpartum.  Concern for possible eclampsia. CT of the head without any acute abnormality. She was given 4 mg magnesium. 1 mg of Ativan. She remains hypertensive after a dose of labetalol and now mild bradycardia. We'll try hydralazine. Discussed with OB/GYN. Will transfer to [facility] hospital for further evaluation/treatment.   Final Clinical Impressions(s) / ED Diagnoses   Final diagnoses:  Seizure (HCC)  Hypertension, unspecified  type    New Prescriptions New Prescriptions   No medications on file     Raeford Razor, MD 07/18/16 [phone]

## 2016-07-11 ENCOUNTER — Encounter (HOSPITAL_COMMUNITY): Payer: Self-pay

## 2016-07-11 LAB — COMPREHENSIVE METABOLIC PANEL
ALK PHOS: 72 U/L (ref 38–126)
ALT: 14 U/L (ref 14–54)
AST: 14 U/L — ABNORMAL LOW (ref 15–41)
Albumin: 3 g/dL — ABNORMAL LOW (ref 3.5–5.0)
Anion gap: 5 (ref 5–15)
BILIRUBIN TOTAL: 0.4 mg/dL (ref 0.3–1.2)
BUN: 6 mg/dL (ref 6–20)
CALCIUM: 6.8 mg/dL — AB (ref 8.9–10.3)
CO2: 27 mmol/L (ref 22–32)
CREATININE: 0.68 mg/dL (ref 0.44–1.00)
Chloride: 109 mmol/L (ref 101–111)
Glucose, Bld: 86 mg/dL (ref 65–99)
Potassium: 4.2 mmol/L (ref 3.5–5.1)
Sodium: 141 mmol/L (ref 135–145)
Total Protein: 6 g/dL — ABNORMAL LOW (ref 6.5–8.1)

## 2016-07-11 LAB — CBC
HCT: 36.2 % (ref 36.0–46.0)
Hemoglobin: 11.9 g/dL — ABNORMAL LOW (ref 12.0–15.0)
MCH: 27 pg (ref 26.0–34.0)
MCHC: 32.9 g/dL (ref 30.0–36.0)
MCV: 82.3 fL (ref 78.0–100.0)
PLATELETS: 279 10*3/uL (ref 150–400)
RBC: 4.4 MIL/uL (ref 3.87–5.11)
RDW: 14.3 % (ref 11.5–15.5)
WBC: 4.4 10*3/uL (ref 4.0–10.5)

## 2016-07-11 NOTE — Progress Notes (Signed)
Daily Antepartum Note  Admission Date: 07/10/2016 Current Date: 07/11/2016 12:29 PM  Nicole Gallegos is a 31 y.o. [id number] @ [redacted]w[redacted]d, HD#2/PPD#9 SVD/POD#9 ppBTL, admitted for postpartum eclamptic seizure.  Pregnancy complicated by: ?history of seizures outside of pregnancy, h/o mgraines.  Overnight/24hr events:  None. Pt came off mg late this morning.   Subjective:  No HA, flashes of light in vision, abdominal pain. Patient still with some blurry vision but it seems to be improving.  Objective:    Current Vital Signs 24h Vital Sign Ranges  T 97.8 F (36.6 C) Temp  Avg: 97.8 F (36.6 C)  Min: 97.4 F (36.3 C)  Max: 98.2 F (36.8 C)  BP (!) 125/91 BP  Min: 104/64  Max: 144/89  HR 68 Pulse  Avg: 80.8  Min: 68  Max: 96  RR 20 Resp  Avg: 17.6  Min: 16  Max: 20  SaO2 99 % Not Delivered SpO2  Avg: 99.6 %  Min: 99 %  Max: 100 %       24 Hour I/O Current Shift I/O  Time Ins Outs 12/31 0701 - 01/01 0700 In: 3428 [P.O.:1438; I.V.:1990] Out: 1275 [Urine:1275] 01/01 0701 - 01/01 1900 In: 945.8 [P.O.:300; I.V.:645.8] Out: 750 [Urine:750]   Patient Vitals for the past 24 hrs:  BP Temp Temp src Pulse Resp SpO2  07/11/16 1210 (!) 125/91 97.8 F (36.6 C) Oral 68 20 -  07/11/16 1100 - - - - 18 -  07/11/16 1000 133/89 98 F (36.7 C) Oral 84 18 99 %  07/11/16 0900 - - - - 20 99 %  07/11/16 0810 - - - - 18 100 %  07/11/16 0605 115/80 97.8 F (36.6 C) Oral 75 18 100 %  07/11/16 0203 105/67 - - - - -  07/11/16 0100 104/64 98.2 F (36.8 C) Oral 79 18 -  07/11/16 0000 114/85 97.8 F (36.6 C) Oral 76 - -  07/10/16 2200 (!) 124/97 - - 87 16 100 %  07/10/16 2100 127/75 - - 82 18 -  07/10/16 2000 123/78 97.6 F (36.4 C) Oral 79 16 99 %  07/10/16 1900 112/84 - - 83 16 -  07/10/16 1808 (!) 144/89 - - 79 16 -  07/10/16 1715 123/78 - - - - -  07/10/16 1709 120/78 - - 86 16 -  07/10/16 1616 136/90 - - 75 17 -  07/10/16 1500 (!) 125/94 - - 81 17 -  07/10/16 1405 (!) 142/87 97.4 F (36.3 C) Oral  76 17 -  07/10/16 1331 - 98 F (36.7 C) - - - -  07/10/16 1306 126/80 - - 96 18 100 %  07/10/16 1300 126/80 - - 86 19 100 %   Physical exam: General: Well nourished, well developed female in no acute distress. Patient eating lunch  Medications: Current Facility-Administered Medications  Medication Dose Route Frequency Provider Last Rate Last Dose  . 0.9 %  sodium chloride infusion   Intravenous Continuous Raeford Razor, MD 75 mL/hr at 07/10/16 1124    . hydrALAZINE (APRESOLINE) injection 5-10 mg  5-10 mg Intravenous Q20 Min PRN Hermina Staggers, MD      . ibuprofen (ADVIL,MOTRIN) tablet 600 mg  600 mg Oral Q6H PRN Hermina Staggers, MD   600 mg at 07/10/16 2033  . labetalol (NORMODYNE,TRANDATE) injection 20-40 mg  20-40 mg Intravenous Q10 min PRN Hermina Staggers, MD      . lactated ringers infusion   Intravenous Continuous Hermina Staggers, MD  100 mL/hr at 07/11/16 1010    . lactated ringers infusion   Intravenous Continuous Hermina StaggersMichael L Ervin, MD 100 mL/hr at 07/11/16 0006    . magnesium bolus via infusion  2 g/hr Intravenous Once Hermina StaggersMichael L Ervin, MD      . oxyCODONE-acetaminophen (PERCOCET/ROXICET) 5-325 MG per tablet 1 tablet  1 tablet Oral Q4H PRN Hermina StaggersMichael L Ervin, MD   1 tablet at 07/11/16 0820  . simethicone (MYLICON) chewable tablet 80 mg  80 mg Oral QID PRN Hermina StaggersMichael L Ervin, MD   80 mg at 07/10/16 1614  . zolpidem (AMBIEN) tablet 5 mg  5 mg Oral QHS PRN Hermina StaggersMichael L Ervin, MD   5 mg at 07/11/16 0010    Labs:   Recent Labs Lab 07/10/16 1125 07/11/16 1018  WBC 5.4 4.4  HGB 12.8 11.9*  HCT 39.4 36.2  PLT 317 279     Recent Labs Lab 07/10/16 1100 07/10/16 1604 07/11/16 1018  NA 142 140 141  K 3.8 3.4* 4.2  CL 110 111 109  CO2 24 22 27   BUN 12 9 6   CREATININE 0.74 0.64 0.68  CALCIUM 8.9 7.9* 6.8*  PROT  --  6.2* 6.0*  BILITOT  --  0.5 0.4  ALKPHOS  --  78 72  ALT  --  15 14  AST  --  14* 14*  GLUCOSE 70 90 86    Radiology: no new imaging  Assessment & Plan:  Patient  doing well *Postpartum: routine care.  *eclampsia: pt doing well. Continue to follow pressures and visual s/s. If not improving, may need optho consult -s/p Mg x 24hrs -negative Echo head CT in the ER *PPx: SCDs, OOB ad lib *FEN/GI: regular diet. Saline lock IV *Dispo: possibly tomorrow.   Cornelia Copaharlie Raiford Fetterman, Jr. MD Attending Center for [facility](Faculty Practice)

## 2016-07-12 DIAGNOSIS — O152 Eclampsia in the puerperium: Principal | ICD-10-CM

## 2016-07-12 MED ORDER — HYDROCHLOROTHIAZIDE 12.5 MG PO CAPS
12.5000 mg | ORAL_CAPSULE | Freq: Every day | ORAL | Status: DC
  Administered 2016-07-12: 12.5 mg via ORAL
  Filled 2016-07-12 (×2): qty 1

## 2016-07-12 MED ORDER — LISINOPRIL 10 MG PO TABS
10.0000 mg | ORAL_TABLET | Freq: Every day | ORAL | Status: DC
  Administered 2016-07-12: 10 mg via ORAL
  Filled 2016-07-12 (×2): qty 1

## 2016-07-12 MED ORDER — LISINOPRIL-HYDROCHLOROTHIAZIDE 10-12.5 MG PO TABS: 1.0000 | ORAL_TABLET | Freq: Every day | ORAL | 3 refills | Status: DC

## 2016-07-12 NOTE — Discharge Instructions (Signed)

## 2016-07-12 NOTE — Discharge Summary (Signed)
Physician Discharge Summary  Patient ID: Nicole Gallegos MRN: [id number] DOB/AGE: 30/05/1986 30 y.o.  Admit date: 07/10/2016 Discharge date: 07/12/2016  Admission Diagnoses: Eclampsia  Discharge Diagnoses:  Active Problems:   Eclampsia complicating pregnancy, delivered   Eclampsia during pregnancy, delivered   Discharged Condition: good  Hospital Course: Patient presented to [facility] with eclamptic seizure. She was postpartum day #8. She was started on magnesium, which she continued for 24 hours, after which she was monitored for 24-36 more hours. Her blood pressure was in the mild range and she was started on oral blood pressure medication. She was discharged in a stable condition. She will follow up in the office in 1 week.  Consults: None  Significant Diagnostic Studies: labs: normal CMP and CBC  Treatments: magnesium  Discharge Exam: Blood pressure (!) 138/98, pulse (!) 55, temperature 98.9 F (37.2 C), temperature source Oral, resp. rate 18, height 5\' 7"  (1.702 m), weight 150 lb (68 kg), last menstrual period 10/04/2015, SpO2 100 %, unknown if currently breastfeeding. General appearance: alert, cooperative and no distress GI: soft, non-tender; bowel sounds normal; no masses,  no organomegaly Extremities: extremities normal, atraumatic, no cyanosis or edema and Homans sign is negative, no sign of DVT Pulses: 2+ and symmetric  Disposition: 01-Home or Self Care  Discharge Instructions    Activity as tolerated    Complete by:  As directed    Call MD for:  difficulty breathing, headache or visual disturbances    Complete by:  As directed    Call MD for:  extreme fatigue    Complete by:  As directed    Call MD for:  hives    Complete by:  As directed    Call MD for:  persistant dizziness or light-headedness    Complete by:  As directed    Call MD for:  persistant nausea and vomiting    Complete by:  As directed    Call MD for:  redness, tenderness, or signs of infection (pain,  swelling, redness, odor or green/yellow discharge around incision site)    Complete by:  As directed    Call MD for:  severe uncontrolled pain    Complete by:  As directed    Call MD for:  temperature >100.4    Complete by:  As directed    Diet - low sodium heart healthy    Complete by:  As directed    Sexual acrtivity    Complete by:  As directed      Allergies as of 07/12/2016      Reactions   Chocolate Hives      Medication List    TAKE these medications   albuterol 108 (90 Base) MCG/ACT inhaler Commonly known as:  PROVENTIL HFA;VENTOLIN HFA Inhale 2 puffs into the lungs every 6 (six) hours as needed for wheezing or shortness of breath.   ibuprofen 200 MG tablet Commonly known as:  ADVIL,MOTRIN Take 800 mg by mouth every 8 (eight) hours as needed.   lisinopril-hydrochlorothiazide 10-12.5 MG tablet Commonly known as:  PRINZIDE,ZESTORETIC Take 1 tablet by mouth daily.   PRENATAL GUMMIES/DHA & FA 0.4-32.5 MG Chew Chew 2 each by mouth daily.      Follow-up Information    Center for [facility] Go in 1 week(s).   Specialty:  Obstetrics and Gynecology Contact information: [address] [place] [address] [phone]          Greater than 30 minutes spent in discharge of this patient.  Signed: Levie HeritageJacob J Stinson 07/12/2016, 2:23 PM

## 2016-07-12 NOTE — Progress Notes (Signed)
Daily Antepartum Note  Admission Date: 07/10/2016 Current Date: 07/12/2016 7:41 AM  Rockell Manson Passey is a 31 y.o. [id number] @ [redacted]w[redacted]d, HD#3/PPD#10 SVD/POD#10 ppBTL, admitted for postpartum eclamptic seizure.  Pregnancy complicated by: history of seizures outside of pregnancy due to inciting events, h/o mgraines.  Overnight/24hr events:  None  Subjective:  No s/s of pre-x including visual symptoms.   Objective:    Current Vital Signs 24h Vital Sign Ranges  T 97.4 F (36.3 C) Temp  Avg: 97.9 F (36.6 C)  Min: 97.4 F (36.3 C)  Max: 98.7 F (37.1 C)  BP (!) 146/94 BP  Min: 122/95  Max: 146/94  HR (!) 58 Pulse  Avg: 71.5  Min: 58  Max: 84  RR 18 Resp  Avg: 18.5  Min: 18  Max: 20  SaO2 100 % Not Delivered SpO2  Avg: 99.3 %  Min: 98 %  Max: 100 %       24 Hour I/O Current Shift I/O  Time Ins Outs 01/01 0701 - 01/02 0700 In: 1725.8 [P.O.:1080; I.V.:645.8] Out: 1000 [Urine:1000] No intake/output data recorded.   Patient Vitals for the past 24 hrs:  BP Temp Temp src Pulse Resp SpO2  07/12/16 0657 (!) 146/94 - - (!) 58 - -  07/12/16 0010 (!) 133/92 97.4 F (36.3 C) Oral 68 18 100 %  07/11/16 1926 132/88 97.7 F (36.5 C) Oral 69 18 -  07/11/16 1621 (!) 122/95 98.7 F (37.1 C) Oral 82 18 98 %  07/11/16 1210 (!) 125/91 97.8 F (36.6 C) Oral 68 20 100 %  07/11/16 1100 - - - - 18 -  07/11/16 1000 133/89 98 F (36.7 C) Oral 84 18 99 %  07/11/16 0900 - - - - 20 99 %  07/11/16 0810 - - - - 18 100 %   Physical exam: General: Well nourished, well developed female in no acute distress. Patient eating lunch CTAB, no resp distres Normal s1 and s2, no MRGs Abdomen: NTTP, FF below the umbilicus, ND No c/c/e  Medications: Current Facility-Administered Medications  Medication Dose Route Frequency Provider Last Rate Last Dose  . hydrALAZINE (APRESOLINE) injection 5-10 mg  5-10 mg Intravenous Q20 Min PRN Hermina Staggers, MD      . ibuprofen (ADVIL,MOTRIN) tablet 600 mg  600 mg Oral Q6H PRN  Hermina Staggers, MD   600 mg at 07/10/16 2033  . labetalol (NORMODYNE,TRANDATE) injection 20-40 mg  20-40 mg Intravenous Q10 min PRN Hermina Staggers, MD      . oxyCODONE-acetaminophen (PERCOCET/ROXICET) 5-325 MG per tablet 1 tablet  1 tablet Oral Q4H PRN Hermina Staggers, MD   1 tablet at 07/11/16 1531  . simethicone (MYLICON) chewable tablet 80 mg  80 mg Oral QID PRN Hermina Staggers, MD   80 mg at 07/10/16 1614  . zolpidem (AMBIEN) tablet 5 mg  5 mg Oral QHS PRN Hermina Staggers, MD   5 mg at 07/11/16 0010    Labs:   Recent Labs Lab 07/10/16 1125 07/11/16 1018  WBC 5.4 4.4  HGB 12.8 11.9*  HCT 39.4 36.2  PLT 317 279     Recent Labs Lab 07/10/16 1100 07/10/16 1604 07/11/16 1018  NA 142 140 141  K 3.8 3.4* 4.2  CL 110 111 109  CO2 24 22 27   BUN 12 9 6   CREATININE 0.74 0.64 0.68  CALCIUM 8.9 7.9* 6.8*  PROT  --  6.2* 6.0*  BILITOT  --  0.5 0.4  ALKPHOS  --  78 72  ALT  --  15 14  AST  --  14* 14*  GLUCOSE 70 90 86    Radiology: no new imaging  Assessment & Plan:  Patient doing well *Postpartum: routine care.  *eclampsia: pt doing well. Continue to follow pressures. I d/w her that her BPs are barely mild range and likely doesn't need meds.  -s/p Mg x 24hrs -negative [place] head CT in the ER *PPx: SCDs, OOB ad lib *FEN/GI: regular diet. Saline lock IV *Dispo: likely later today. Will need 1wk BP check (task sent to clinical pool)  Cornelia Copaharlie Shareeka Yim, Jr. MD Attending Center for [facility](Faculty Practice)

## 2016-07-12 NOTE — Progress Notes (Signed)
Discharge teaching complete with patient. Pt understood all information and did not have any questions. Pt pushed via wheelchair and discharged home to family.

## 2016-07-21 ENCOUNTER — Ambulatory Visit: Payer: Self-pay | Admitting: Student

## 2016-07-21 ENCOUNTER — Ambulatory Visit (INDEPENDENT_AMBULATORY_CARE_PROVIDER_SITE_OTHER): Payer: Medicaid Other | Admitting: Obstetrics and Gynecology

## 2016-07-21 DIAGNOSIS — Z8759 Personal history of other complications of pregnancy, childbirth and the puerperium: Secondary | ICD-10-CM

## 2016-07-21 NOTE — Patient Instructions (Signed)
Stop your blood pressure medication 3-4 days before your next appointment. Continue to check your blood pressure at home and let us know if it is less 100/60 or >140/90.

## 2016-07-21 NOTE — Progress Notes (Signed)
Pt following up from hospitilization for eclampsia.
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
## 2016-08-01 ENCOUNTER — Encounter (HOSPITAL_COMMUNITY): Payer: Self-pay

## 2016-08-09 ENCOUNTER — Other Ambulatory Visit (HOSPITAL_COMMUNITY)
Admission: RE | Admit: 2016-08-09 | Discharge: 2016-08-09 | Disposition: A | Discharge status: Home / Self Care | Payer: Medicaid Other | Source: Ambulatory Visit | Attending: Family Medicine | Admitting: Family Medicine

## 2016-08-09 ENCOUNTER — Inpatient Hospital Stay (HOSPITAL_COMMUNITY): Payer: Medicaid Other

## 2016-08-09 ENCOUNTER — Encounter (HOSPITAL_COMMUNITY): Payer: Self-pay | Admitting: *Deleted

## 2016-08-09 ENCOUNTER — Encounter: Payer: Self-pay | Admitting: Certified Nurse Midwife

## 2016-08-09 ENCOUNTER — Inpatient Hospital Stay (HOSPITAL_COMMUNITY)
Admission: AD | Admit: 2016-08-09 | Discharge: 2016-08-09 | Disposition: A | Discharge status: Home / Self Care | Payer: Medicaid Other | Source: Ambulatory Visit | Attending: Obstetrics and Gynecology | Admitting: Obstetrics and Gynecology

## 2016-08-09 ENCOUNTER — Ambulatory Visit (INDEPENDENT_AMBULATORY_CARE_PROVIDER_SITE_OTHER): Payer: Medicaid Other | Admitting: Certified Nurse Midwife

## 2016-08-09 VITALS — BP 130/95 | HR 63 | Ht 68.0 in | Wt 140.8 lb

## 2016-08-09 DIAGNOSIS — O1495 Unspecified pre-eclampsia, complicating the puerperium: Secondary | ICD-10-CM | POA: Insufficient documentation

## 2016-08-09 DIAGNOSIS — R079 Chest pain, unspecified: Secondary | ICD-10-CM

## 2016-08-09 DIAGNOSIS — K209 Esophagitis, unspecified: Secondary | ICD-10-CM | POA: Diagnosis not present

## 2016-08-09 DIAGNOSIS — O165 Unspecified maternal hypertension, complicating the puerperium: Secondary | ICD-10-CM

## 2016-08-09 DIAGNOSIS — R0789 Other chest pain: Secondary | ICD-10-CM | POA: Diagnosis not present

## 2016-08-09 DIAGNOSIS — Z113 Encounter for screening for infections with a predominantly sexual mode of transmission: Secondary | ICD-10-CM

## 2016-08-09 DIAGNOSIS — R001 Bradycardia, unspecified: Secondary | ICD-10-CM | POA: Diagnosis not present

## 2016-08-09 DIAGNOSIS — N898 Other specified noninflammatory disorders of vagina: Secondary | ICD-10-CM | POA: Insufficient documentation

## 2016-08-09 DIAGNOSIS — R030 Elevated blood-pressure reading, without diagnosis of hypertension: Secondary | ICD-10-CM | POA: Diagnosis present

## 2016-08-09 LAB — COMPREHENSIVE METABOLIC PANEL
ALBUMIN: 4.1 g/dL (ref 3.5–5.0)
ALK PHOS: 60 U/L (ref 38–126)
ALT: 13 U/L — AB (ref 14–54)
AST: 14 U/L — ABNORMAL LOW (ref 15–41)
Anion gap: 6 (ref 5–15)
BUN: 9 mg/dL (ref 6–20)
CALCIUM: 9.2 mg/dL (ref 8.9–10.3)
CO2: 29 mmol/L (ref 22–32)
Chloride: 106 mmol/L (ref 101–111)
Creatinine, Ser: 0.79 mg/dL (ref 0.44–1.00)
GFR calc non Af Amer: >60 mL/min (ref >60)
GLUCOSE: 87 mg/dL (ref 65–99)
Potassium: 4.5 mmol/L (ref 3.5–5.1)
SODIUM: 141 mmol/L (ref 135–145)
Total Bilirubin: 0.9 mg/dL (ref 0.3–1.2)
Total Protein: 6.8 g/dL (ref 6.5–8.1)

## 2016-08-09 LAB — URINALYSIS, ROUTINE W REFLEX MICROSCOPIC
Bilirubin Urine: NEGATIVE
Glucose, UA: NEGATIVE mg/dL
Hgb urine dipstick: NEGATIVE
KETONES UR: NEGATIVE mg/dL
Nitrite: NEGATIVE
PH: 7 (ref 5.0–8.0)
PROTEIN: NEGATIVE mg/dL
Specific Gravity, Urine: 1.014 (ref 1.005–1.030)

## 2016-08-09 LAB — CBC
HCT: 39.9 % (ref 36.0–46.0)
HEMOGLOBIN: 12.9 g/dL (ref 12.0–15.0)
MCH: 26.6 pg (ref 26.0–34.0)
MCHC: 32.3 g/dL (ref 30.0–36.0)
MCV: 82.3 fL (ref 78.0–100.0)
Platelets: 214 10*3/uL (ref 150–400)
RBC: 4.85 MIL/uL (ref 3.87–5.11)
RDW: 15.5 % (ref 11.5–15.5)
WBC: 5.1 10*3/uL (ref 4.0–10.5)

## 2016-08-09 LAB — TROPONIN I: Troponin I: <0.03 ng/mL (ref <0.03)

## 2016-08-09 MED ORDER — GI COCKTAIL ~~LOC~~
30.0000 mL | Freq: Once | ORAL | Status: AC
  Administered 2016-08-09: 30 mL via ORAL
  Filled 2016-08-09: qty 30

## 2016-08-09 NOTE — MAU Note (Signed)
Urine in lab 

## 2016-08-09 NOTE — Discharge Instructions (Signed)
Hypertension During Pregnancy Hypertension is also called high blood pressure. High blood pressure means that the force of your blood moving in your body is too strong. When you are pregnant, this condition should be watched carefully. It can cause problems for you and your baby. Follow these instructions at home: Eating and drinking  Drink enough fluid to keep your pee (urine) clear or pale yellow.  Eat healthy foods that are low in salt (sodium). ? Do not add salt to your food. ? Check labels on foods and drinks to see much salt is in them. Look on the label where you see "Sodium." Lifestyle  Do not use any products that contain nicotine or tobacco, such as cigarettes and e-cigarettes. If you need help quitting, ask your doctor.  Do not use alcohol.  Avoid caffeine.  Avoid stress. Rest and get plenty of sleep. General instructions  Take over-the-counter and prescription medicines only as told by your doctor.  While lying down, lie on your left side. This keeps pressure off your baby.  While sitting or lying down, raise (elevate) your feet. Try putting some pillows under your lower legs.  Exercise regularly. Ask your doctor what kinds of exercise are best for you.  Keep all prenatal and follow-up visits as told by your doctor. This is important. Contact a doctor if:  You have symptoms that your doctor told you to watch for, such as: ? Fever. ? Throwing up (vomiting). ? Headache. Get help right away if:  You have very bad pain in your belly (abdomen).  You are throwing up, and this does not get better with treatment.  You suddenly get swelling in your hands, ankles, or face.  You gain 4 lb (1.8 kg) or more in 1 week.  You get bleeding from your vagina.  You have blood in your pee.  You do not feel your baby moving as much as normal.  You have a change in vision.  You have muscle twitching or sudden tightening (spasms).  You have trouble breathing.  Your lips  or fingernails turn blue. This information is not intended to replace advice given to you by your health care provider. Make sure you discuss any questions you have with your health care provider. Document Released: 07/30/2010 Document Revised: 03/08/2016 Document Reviewed: 03/08/2016 Elsevier Interactive Patient Education  2017 Elsevier Inc.  

## 2016-08-09 NOTE — MAU Note (Signed)
Went downstairs for 6 wk check up (vag del Dec 23)  A week after delivery, she had eclampsia.  Today her BP was still up and she is having some symptoms: been having some headaches and dizziness, pressure in her chest and pain across upper abd.  Had been on BP medication, was told to stop taking 4 days before appt to see what it would be.

## 2016-08-09 NOTE — MAU Provider Note (Signed)
Chief Complaint:  Hypertension   First Provider Initiated Contact with Patient 08/09/16 1029       HPI: Nicole Gallegos is a 31 y.o. [id number] who is 6 weeks postpartum, presents to maternity admissions reporting pressure in chest, elevated BP after being off BP meds x 4 days, and upper abdominal pain. Also having some headaches and dizziness, but these started a week ago, before stopping med. She reports no vaginal bleeding, vaginal itching/burning, urinary symptoms, h/a, dizziness, n/v, or fever/chills.    Pregnancy course was remarkable for eclampsia at 8 days postpartum.  Hypertension  This is a recurrent problem. The current episode started today. Associated symptoms include chest pain (not pain but pressure). Pertinent negatives include no anxiety, blurred vision, headaches, malaise/fatigue, palpitations, peripheral edema or shortness of breath. There are no associated agents to hypertension. Treatments tried: Lisinopril + HCTZ. There are no compliance problems.     RN Note: Went downstairs for 6 wk check up (vag del Dec 23)  A week after delivery, she had eclampsia.  Today her BP was still up and she is having some symptoms: been having some headaches and dizziness, pressure in her chest and pain across upper abd.  Had been on BP medication, was told to stop taking 4 days before appt to see what it would be.  Past Medical History: Past Medical History:  Diagnosis Date  . Asthma    Exercise induced;inhaler prn  . Constipation   . Depression    No meds. currently  . Diabetes type 2, uncontrolled (HCC)   . History of blood transfusion    Accident at 7 yoa and lost a lot of blood  . Migraine   . Panic attacks    No meds currently;used to take meds in 2009  . Preterm labor 2010   2nd child;had to stop labor 2 times;due to stress  . Scoliosis   . Seizures (HCC)    if gets overheated;no meds, last seizure 6 yrs ago    Past obstetric history: OB History  Gravida Para Term Preterm  AB Living  6 4 3 1 2 4   SAB TAB Ectopic Multiple Live Births  1 1 0 0 4    # Outcome Date GA Lbr Len/2nd Weight Sex Delivery Anes PTL Lv  6 Term 07/02/16 [redacted]w[redacted]d / 00:04 6 lb 6.8 oz (2.915 kg) M Vag-Spont None  LIV  5 Preterm 04/19/13 [redacted]w[redacted]d 06:02 / 00:05 5 lb 8.7 oz (2.515 kg) F Vag-Spont None  LIV  4 TAB 2013 [redacted]w[redacted]d            Birth Comments: No complications  3 SAB 2011 [redacted]w[redacted]d            Birth Comments: D&C;no complications  2 Term 10/02/08 [redacted]w[redacted]d  5 lb 7 oz (2.466 kg) F Vag-Spont None  LIV     Birth Comments: No complications  1 Term 12/12/05 [redacted]w[redacted]d  7 lb 7 oz (3.374 kg) F Vag-Spont None  LIV     Birth Comments: No complications      Past Surgical History: Past Surgical History:  Procedure Laterality Date  . DILATION AND CURETTAGE OF UTERUS     Suction D&C to remove 16 wk. fetal demise  . FOOT SURGERY     R foot, repair torn tissue and skin graft  . TUBAL LIGATION Bilateral 07/02/2016   Procedure: POST PARTUM TUBAL LIGATION with filshie clips;  Surgeon: Tereso Newcomer, MD;  Location: [facility];  Service: Gynecology;  Laterality: Bilateral;  .  WISDOM TOOTH EXTRACTION  2008    Family History: Family History  Problem Relation Age of Onset  . Seizures Father     if gets overheated  . Heart disease Maternal Aunt     Hole in the heart  . Heart disease Maternal Uncle     Hole in the heart  . Heart attack Maternal Grandmother     Deceased  . Hypertension Maternal Grandmother   . Hypertension Maternal Uncle   . Hypertension Paternal Uncle   . Diabetes Paternal Grandmother   . Seizures Daughter     if gets overheated  . Seizures Daughter     if gets overheated  . Bipolar disorder Cousin     maternal  . Depression Cousin     maternal  . Other Daughter     1st child had 2 vein umbilical cord; Pt did weekly NSTs.  . Anesthesia problems Neg Hx     Social History: Social History  Substance Use Topics  . Smoking status: Never Smoker  . Smokeless tobacco: Never Used  .  Alcohol use No     Comment: Occasioanlly prior to pregnancy    Allergies:  Allergies  Allergen Reactions  . Chocolate Hives    Meds:  Prescriptions Prior to Admission  Medication Sig Dispense Refill Last Dose  . Prenatal MV-Min-FA-Omega-3 (PRENATAL GUMMIES/DHA & FA) 0.4-32.5 MG CHEW Chew 2 each by mouth daily.   08/09/2016 at Unknown time  . albuterol (PROVENTIL HFA;VENTOLIN HFA) 108 (90 Base) MCG/ACT inhaler Inhale 2 puffs into the lungs every 6 (six) hours as needed for wheezing or shortness of breath.   rescue  . lisinopril-hydrochlorothiazide (PRINZIDE,ZESTORETIC) 10-12.5 MG tablet Take 1 tablet by mouth daily. (Patient not taking: Reported on 08/09/2016) 30 tablet 3 Not Taking    I have reviewed patient's Past Medical Hx, Surgical Hx, Family Hx, Social Hx, medications and allergies.  ROS:  Review of Systems  Constitutional: Negative for malaise/fatigue.  Eyes: Negative for blurred vision.  Respiratory: Negative for shortness of breath.   Cardiovascular: Positive for chest pain (not pain but pressure). Negative for palpitations.  Neurological: Negative for headaches.   Other systems negative     Physical Exam  Patient Vitals for the past 24 hrs:  BP Temp Temp src Pulse Resp Height Weight  08/09/16 1010 131/94 - - 63 18 - -  08/09/16 0958 (!) 136/105 97.8 F (36.6 C) Oral (!) 59 16 5\' 8"  (1.727 m) 139 lb 12.8 oz (63.4 kg)   Vitals:   08/09/16 1219 08/09/16 1234 08/09/16 1249 08/09/16 1415  BP: 128/83 114/80 114/76 114/75  Pulse: (!) 56 (!) 56 65 69  Resp:    16  Temp:      TempSrc:      SpO2:      Weight:      Height:        Constitutional: Well-developed, well-nourished female in no acute distress.  Cardiovascular: normal rate and rhythm, no ectopy audible, S1 & S2 heard, no murmur Respiratory: normal effort, no distress. Lungs CTAB with no wheezes or crackles GI: Abd soft, non-tender.  Nondistended.  No rebound, No guarding.  Bowel Sounds audible  MS:  Extremities nontender, no edema, normal ROM Neurologic: Alert and oriented x 4.   Grossly nonfocal. GU: Neg CVAT. Skin:  Warm and Dry Psych:  Affect appropriate.  PELVIC EXAM:deferred   Labs: No results found for this or any previous visit (from the past 24 hour(s)). --/--/AB POS (12/23 0015)  Imaging:  EKG showed Sinus bradycardia  MAU Course/MDM: I have ordered labs as follows:  CBC, CMET, Troponin, UA Imaging ordered: EKG, Chest Xray, 2 views Results reviewed. Labs normal   Troponin Negative, Urine protein Negative  Consult Dr Vergie LivingPickens with presentation, history , exam findings and lab results.   Treatments in MAU included GI cocktail which produced relief of chest pressure..   Pt stable at time of discharge.  Assessment: Preeclampsia with persistent hypertension postpartum No evidence of current severe features History of postpartum eclampsia on 07/10/16 Chest pressure relieved by GI Cocktail, presumed to be esophagitis Sinus bradycardia, asymptomatic  Plan: Discharge home Recommend Restarting Lisinopril/HCTZ per Dr Vergie LivingPickens, then BP check next week. Will reevaluate at 12 wks PP Preeclampsia precautions  Encouraged to return here or to other Urgent Care/ED if she develops worsening of symptoms, increase in pain, fever, or other concerning symptoms.   Wynelle BourgeoisMarie Jacoby Zanni CNM, MSN Certified Nurse-Midwife 08/09/2016 10:31 AM

## 2016-08-09 NOTE — MAU Note (Signed)
EKG in process

## 2016-08-09 NOTE — Progress Notes (Addendum)
Subjective:     Nicole Gallegos is a 31 y.o. female who presents for a postpartum visit. She is 6 weeks postpartum following a spontaneous vaginal delivery. I have fully reviewed the prenatal and intrapartum course. The delivery was at 37.2 gestational weeks. Outcome: spontaneous vaginal delivery. Anesthesia: none. Postpartum course has been complicated by Eclampsia on day 8 postpartum. She was readmitted for Magnesium and started in Lisinopril. Baby's course has been unremarkable. Baby is feeding by bottle - Similac Advance. Bleeding no bleeding. Bowel function is normal. Bladder function is normal. Patient is not sexually active. Contraception method is tubal ligation.  Depression/anxiety screening: negative.  Patient stopped blood pressure medication 4 days ago per Dr. Vergie LivingPickens instructions. Since then she's had frontal headaches, chest pressure and tightness, epigastric pain, and shortness of breath. Patient also reports white vaginal discharge with itching.   The following portions of the patient's history were reviewed and updated as appropriate: allergies, current medications, past family history, past medical history, past social history, past surgical history and problem list.  Review of Systems Pertinent items are noted in HPI.   Objective:    BP (!) 130/95   Pulse 63   Ht 5\' 8"  (1.727 m)   Wt 140 lb 12.8 oz (63.9 kg)   Breastfeeding? No   BMI 21.41 kg/m   General:  alert, cooperative and no distress   Breasts:    Lungs: clear to auscultation bilaterally  Heart:  regular rate and rhythm  Abdomen:    Vulva:  normal  Vagina: vagina positive for frothy yellow discharge  Cervix:  anteverted  Corpus: not examined  Adnexa:  not evaluated  Rectal Exam: Not performed.        Assessment:      1. Postpartum HTN-recurrence of symptoms and HTN today worrisome for preeclampsia 2. Vaginal discharge   Plan:      1. Contraception: tubal ligation 2. Evaluation in MAU 3. Wet prep

## 2016-08-11 ENCOUNTER — Telehealth: Payer: Self-pay | Admitting: General Practice

## 2016-08-11 LAB — CERVICOVAGINAL ANCILLARY ONLY
Bacterial vaginitis: POSITIVE — AB
CANDIDA VAGINITIS: NEGATIVE
CHLAMYDIA, DNA PROBE: NEGATIVE
Neisseria Gonorrhea: NEGATIVE
Trichomonas: POSITIVE — AB

## 2016-08-11 MED ORDER — METRONIDAZOLE 500 MG PO TABS: 2000.0000 mg | ORAL_TABLET | Freq: Once | ORAL | 0 refills | Status: AC

## 2016-08-11 NOTE — Telephone Encounter (Signed)
Per Shawna OrleansMelanie, patient has trich & needs treatment. Med ordered. Called patient and informed her of results & medication sent to pharmacy. Also discussed importance of partner being treated. Patient verbalized understanding to all & had no questions

## 2016-08-12 ENCOUNTER — Telehealth: Payer: Self-pay | Admitting: *Deleted

## 2016-08-12 NOTE — Telephone Encounter (Addendum)
Pt left message stating that she has questions about the test results she received yesterday. Also, one of the prescriptions did not go through to the pharmacy.   2/5  1610  Called pt and left message requesting her to call back with her questions regarding test results and to let us know which medication prescription did not go through to her pharmacy. Also please state whether a detailed message can be left on her voice mail.

## 2016-08-16 ENCOUNTER — Ambulatory Visit: Payer: Self-pay

## 2016-08-17 NOTE — Telephone Encounter (Signed)
Called patient back and she states she didn't know if she was supposed to have two prescriptions or just one. Told patient it was just one prescription. Patient verbalized understanding and asked if the infection would effect her baby since she doesn't know how long she had it. Told patient it wouldn't and that she likely received antibiotics since then & the baby would have received antibiotic eye drops right after delivery to prevent possible infection as well. Patient verbalized understanding to all & had no questions

## 2017-02-28 ENCOUNTER — Encounter (HOSPITAL_COMMUNITY): Payer: Self-pay | Admitting: Emergency Medicine

## 2017-02-28 ENCOUNTER — Ambulatory Visit (HOSPITAL_COMMUNITY)
Admission: EM | Admit: 2017-02-28 | Discharge: 2017-02-28 | Disposition: A | Discharge status: Home / Self Care | Payer: Medicaid Other | Attending: Family Medicine | Admitting: Family Medicine

## 2017-02-28 DIAGNOSIS — R5383 Other fatigue: Secondary | ICD-10-CM

## 2017-02-28 DIAGNOSIS — S39012A Strain of muscle, fascia and tendon of lower back, initial encounter: Secondary | ICD-10-CM

## 2017-02-28 DIAGNOSIS — R42 Dizziness and giddiness: Secondary | ICD-10-CM

## 2017-02-28 MED ORDER — BACITRACIN ZINC 500 UNIT/GM EX OINT
TOPICAL_OINTMENT | CUTANEOUS | Status: AC
  Filled 2017-02-28: qty 0.9

## 2017-02-28 NOTE — Discharge Instructions (Signed)
You may use over the counter ibuprofen or acetaminophen as needed.  ° °

## 2017-02-28 NOTE — ED Triage Notes (Signed)
PT reports his of scoliosis. PT reports legs gave out and she fell two days ago. PT reports low back pain since fall. PT reports lightheadedness and nausea that started this morning.

## 2017-02-28 NOTE — ED Provider Notes (Signed)
[facility]   [id number] 02/28/17 Arrival Time: 1236  ASSESSMENT & PLAN:  1. Strain of lumbar region, initial encounter   2. Fatigue, unspecified type   3. Lightheadedness    Back strain muscular. No imaging warranted. Rec OTC ibuprofen 800mg  TID with food for the next few days. Ensure proper ROM.  As for fatigue and lightheadedness I think a lot of this is secondary to sleep deprivation. Nothing acute. Work note given for the next two days.  Reviewed expectations re: course of current medical issues. Questions answered. Outlined signs and symptoms indicating need for more acute intervention. May f/u here as needed. Patient verbalized understanding. After Visit Summary given.   SUBJECTIVE:  Nicole Gallegos is a 31 y.o. female who presents with complaint of low back strain approx 2 days ago. In parking lot and tripped grabbing her shopping cart. Went to knees. "Felt pull" in lower back. Ambulatory since. Gradual onset of low back discomfort. Prolonged standing exacerbates. No self treatment. No LE sensation changes or weakness. Reports history of mild scoliosis that does not limit her.  Also complains of generalized fatigued. Has been working every day since the beginning of the month in addition to going to school at night. Also has an 57 month old at home. "So sleepy I feel nauseous." Normal PO intake. No vomiting or diarrhea. No abdominal symptoms. Patient's last menstrual period was 02/07/2017. Overall feels sleep deprived. Drinking more coffee to compensate. Non-smoker. No depression/anxiety. Occasional "lightheaded" feeling. Transient. Question more frequent since increasing coffee intake.  ROS: As per HPI. All other systems negative.   OBJECTIVE:  Vitals:   02/28/17 1337 02/28/17 1339  BP: 104/76   Pulse: 72   Resp: 16   Temp: 98 F (36.7 C)   TempSrc: Oral   SpO2: 100%   Weight:  138 lb (62.6 kg)  Height:  5\' 8"  (1.727 m)     General appearance: alert; no  distress; appears fatigued HEENT: normocephalic; atraumatic; conjunctivae normal; TMs normal; nasal mucosa normal; oral mucosa normal Neck: supple Lungs: clear to auscultation bilaterally Heart: regular rate and rhythm Abdomen: soft, non-tender; bowel sounds normal; no masses or organomegaly; no guarding or rebound tenderness Back: some tenderness to palpation of bilat lower back paraspinal musculature; no midline tenderness; FROM of legs at hips Extremities: no cyanosis or edema; symmetrical with no gross deformities Skin: warm and dry Neurologic: normal symmetric reflexes; normal gait Psychological:  alert and cooperative; normal mood and affect  Past Medical History:  Diagnosis Date  . Asthma    Exercise induced;inhaler prn  . Constipation   . Depression    No meds. currently  . Diabetes type 2, uncontrolled (HCC)   . History of blood transfusion    Accident at 7 yoa and lost a lot of blood  . Migraine   . Panic attacks    No meds currently;used to take meds in 2009  . Preterm labor 2010   2nd child;had to stop labor 2 times;due to stress  . Scoliosis   . Seizures (HCC)    if gets overheated;no meds, last seizure 6 yrs ago   Past Surgical History:  Procedure Laterality Date  . DILATION AND CURETTAGE OF UTERUS     Suction D&C to remove 16 wk. fetal demise  . FOOT SURGERY     R foot, repair torn tissue and skin graft  . TUBAL LIGATION Bilateral 07/02/2016   Procedure: POST PARTUM TUBAL LIGATION with filshie clips;  Surgeon: Tereso Newcomer,  MD;  Location: [facility];  Service: Gynecology;  Laterality: Bilateral;  . WISDOM TOOTH EXTRACTION  2008   No FH of fatigue reported.  Allergies  Allergen Reactions  . Chocolate Hives    PMHx, SurgHx, SocialHx, Medications, and Allergies were reviewed in the Visit Navigator and updated as appropriate.      Mardella Layman, MD 02/28/17 [phone]

## 2017-07-10 ENCOUNTER — Emergency Department (HOSPITAL_COMMUNITY)
Admission: EM | Admit: 2017-07-10 | Discharge: 2017-07-10 | Disposition: A | Discharge status: Home / Self Care | Payer: Medicaid Other | Attending: Emergency Medicine | Admitting: Emergency Medicine

## 2017-07-10 ENCOUNTER — Emergency Department (HOSPITAL_COMMUNITY): Payer: Medicaid Other

## 2017-07-10 ENCOUNTER — Other Ambulatory Visit: Payer: Self-pay

## 2017-07-10 ENCOUNTER — Encounter (HOSPITAL_COMMUNITY): Payer: Self-pay | Admitting: Emergency Medicine

## 2017-07-10 DIAGNOSIS — E119 Type 2 diabetes mellitus without complications: Secondary | ICD-10-CM | POA: Diagnosis not present

## 2017-07-10 DIAGNOSIS — M25561 Pain in right knee: Secondary | ICD-10-CM | POA: Diagnosis present

## 2017-07-10 DIAGNOSIS — J45909 Unspecified asthma, uncomplicated: Secondary | ICD-10-CM | POA: Insufficient documentation

## 2017-07-10 DIAGNOSIS — Z79899 Other long term (current) drug therapy: Secondary | ICD-10-CM | POA: Diagnosis not present

## 2017-07-10 MED ORDER — PREDNISONE 10 MG PO TABS: 20.0000 mg | ORAL_TABLET | Freq: Two times a day (BID) | ORAL | 0 refills | Status: DC

## 2017-07-10 MED ORDER — HYDROCODONE-ACETAMINOPHEN 5-325 MG PO TABS
1.0000 | ORAL_TABLET | Freq: Once | ORAL | Status: AC
  Administered 2017-07-10: 1 via ORAL
  Filled 2017-07-10: qty 1

## 2017-07-10 NOTE — Discharge Instructions (Signed)
Call Dr. Darene LamerGrave's office to schedule a follow up appointment.

## 2017-07-10 NOTE — ED Notes (Signed)
Pt ambulated out of ED. She declined discharge vitals.

## 2017-07-10 NOTE — ED Provider Notes (Signed)
[facility]-EMERGENCY DEPT Provider Note   CSN: [id number] Arrival date & time: 07/10/17  1736     History   Chief Complaint Chief Complaint  Patient presents with  . Knee Pain    R knee    HPI Nicole Gallegos is a 31 y.o. female who presents to the ED with right knee pain x one month. Patient reports that she was seen by an Urgent Care and told to wear a brace. Patient has been using ice, taking her medication for pain and today she was at work and heard a crack. Patient has been wearing the knee sleeve without relief. Patient is employed by [employer].   HPI  Past Medical History:  Diagnosis Date  . Asthma    Exercise induced;inhaler prn  . Constipation   . Depression    No meds. currently  . Diabetes type 2, uncontrolled (HCC)   . History of blood transfusion    Accident at 7 yoa and lost a lot of blood  . Migraine   . Panic attacks    No meds currently;used to take meds in 2009  . Preterm labor 2010   2nd child;had to stop labor 2 times;due to stress  . Scoliosis   . Seizures (HCC)    if gets overheated;no meds, last seizure 6 yrs ago    Patient Active Problem List   Diagnosis Date Noted  . Eclampsia complicating pregnancy, delivered 07/10/2016  . Supervision of high risk pregnancy, antepartum 01/13/2016  . High risk pregnancy due to history of preterm labor, antepartum 01/13/2016  . Tension headache, chronic 04/21/2015  . Migraine 04/21/2015  . Anxiety and depression 09/24/2012  . History of blood transfusion 09/24/2012  . Asthma, chronic 09/24/2012  . Prior pregnancy with fetal demise at 50 weeks 09/24/2012    Past Surgical History:  Procedure Laterality Date  . DILATION AND CURETTAGE OF UTERUS     Suction D&C to remove 16 wk. fetal demise  . FOOT SURGERY     R foot, repair torn tissue and skin graft  . TUBAL LIGATION Bilateral 07/02/2016   Procedure: POST PARTUM TUBAL LIGATION with filshie clips;  Surgeon: Tereso Newcomer, MD;   Location: [facility];  Service: Gynecology;  Laterality: Bilateral;  . WISDOM TOOTH EXTRACTION  2008    OB History    Gravida Para Term Preterm AB Living   6 4 3 1 2 4    SAB TAB Ectopic Multiple Live Births   1 1 0 0 4       Home Medications    Prior to Admission medications   Medication Sig Start Date End Date Taking? Authorizing Provider  albuterol (PROVENTIL HFA;VENTOLIN HFA) 108 (90 Base) MCG/ACT inhaler Inhale 2 puffs into the lungs every 6 (six) hours as needed for wheezing or shortness of breath.    [provider]  lisinopril-hydrochlorothiazide (PRINZIDE,ZESTORETIC) 10-12.5 MG tablet Take 1 tablet by mouth daily. Patient not taking: Reported on 08/09/2016 07/12/16   Levie Heritage, DO  predniSONE (DELTASONE) 10 MG tablet Take 2 tablets (20 mg total) by mouth 2 (two) times daily with a meal. 07/10/17   [name], NP  Prenatal MV-Min-FA-Omega-3 (PRENATAL GUMMIES/DHA & FA) 0.4-32.5 MG CHEW Chew 2 each by mouth daily.    [provider]    Family History Family History  Problem Relation Age of Onset  . Seizures Father        if gets overheated  . Heart disease Maternal Aunt  Hole in the heart  . Heart disease Maternal Uncle        Hole in the heart  . Heart attack Maternal Grandmother        Deceased  . Hypertension Maternal Grandmother   . Hypertension Maternal Uncle   . Hypertension Paternal Uncle   . Diabetes Paternal Grandmother   . Seizures Daughter        if gets overheated  . Seizures Daughter        if gets overheated  . Bipolar disorder Cousin        maternal  . Depression Cousin        maternal  . Other Daughter        1st child had 2 vein umbilical cord; Pt did weekly NSTs.  . Anesthesia problems Neg Hx     Social History Social History   Tobacco Use  . Smoking status: Never Smoker  . Smokeless tobacco: Never Used  Substance Use Topics  . Alcohol use: No    Comment: Occasioanlly prior to pregnancy  . Drug use: No      Allergies   Chocolate   Review of Systems Review of Systems  Musculoskeletal: Positive for arthralgias.       Right knee pain  All other systems reviewed and are negative.    Physical Exam Updated Vital Signs BP (!) 106/93 (BP Location: Left Arm)   Pulse 68   Temp 98 F (36.7 C) (Oral)   Resp 18   LMP 07/06/2017 (Exact Date)   SpO2 100%   Physical Exam  Constitutional: She appears well-developed and well-nourished. No distress.  HENT:  Head: Normocephalic and atraumatic.  Eyes: EOM are normal.  Neck: Neck supple.  Cardiovascular: Normal rate.  Pulmonary/Chest: Effort normal.  Musculoskeletal:       Right knee: She exhibits normal range of motion, no swelling, no deformity, no laceration, no erythema and normal alignment. Tenderness found.       Legs: Pain with extension of the knee  Neurological: She is alert. She has normal strength.  Skin: Skin is warm and dry.  Psychiatric: She has a normal mood and affect.  Nursing note and vitals reviewed.    ED Treatments / Results  Labs (all labs ordered are listed, but only abnormal results are displayed) Labs Reviewed - No data to display  Radiology Dg Knee Complete 4 Views Right  Result Date: 07/10/2017 CLINICAL DATA:  Worsening right anterior knee pain for 1 month. No known injury. EXAM: RIGHT KNEE - COMPLETE 4+ VIEW COMPARISON:  None. FINDINGS: No evidence of fracture, dislocation, or joint effusion. No evidence of arthropathy or other focal bone abnormality. Soft tissues are unremarkable. IMPRESSION: Negative. Electronically Signed   By: Myles RosenthalJohn  Stahl M.D.   On: 07/10/2017 18:35    Procedures Procedures (including critical care time)  Medications Ordered in ED Medications - No data to display   Initial Impression / Assessment and Plan / ED Course  I have reviewed the triage vital signs and the nursing notes. 31 y.o. female with right knee pain x 1 month that has gotten worse stable for d/c without  fracture or dislocation noted on x-ray and no neuro deficits. Knee immobilizer and follow up with ortho. Referral given. Will give short burst of steroids. Discussed plan of care with patient and she agrees.   Final Clinical Impressions(s) / ED Diagnoses   Final diagnoses:  Acute pain of right knee    ED Discharge Orders  Ordered    predniSONE (DELTASONE) 10 MG tablet  2 times daily with meals     07/10/17 2050       Kerrie Buffaloeese, Hope HewittM, [place]NP 07/10/17 2057    Arby BarrettePfeiffer, Marcy, MD 07/14/17 1751

## 2017-07-10 NOTE — ED Triage Notes (Signed)
Pt c/o R knee pain x one month. Pt states she was seen by an UC and told to wear brace, pt states she has tried ice, meds, and heard a crack today at work. Pt given knee sleeve and has been wearing sleeve at work (works in [employer]).

## 2017-07-13 DIAGNOSIS — M25561 Pain in right knee: Secondary | ICD-10-CM | POA: Diagnosis not present

## 2017-09-25 ENCOUNTER — Other Ambulatory Visit: Payer: Self-pay

## 2017-09-25 ENCOUNTER — Encounter (HOSPITAL_COMMUNITY): Payer: Self-pay | Admitting: Emergency Medicine

## 2017-09-25 ENCOUNTER — Emergency Department (HOSPITAL_COMMUNITY)
Admission: EM | Admit: 2017-09-25 | Discharge: 2017-09-25 | Disposition: A | Discharge status: Home / Self Care | Payer: 59 | Attending: Emergency Medicine | Admitting: Emergency Medicine

## 2017-09-25 DIAGNOSIS — R55 Syncope and collapse: Secondary | ICD-10-CM | POA: Insufficient documentation

## 2017-09-25 DIAGNOSIS — E119 Type 2 diabetes mellitus without complications: Secondary | ICD-10-CM | POA: Insufficient documentation

## 2017-09-25 DIAGNOSIS — R42 Dizziness and giddiness: Secondary | ICD-10-CM | POA: Diagnosis not present

## 2017-09-25 DIAGNOSIS — F10120 Alcohol abuse with intoxication, uncomplicated: Secondary | ICD-10-CM | POA: Diagnosis not present

## 2017-09-25 DIAGNOSIS — Z79899 Other long term (current) drug therapy: Secondary | ICD-10-CM | POA: Insufficient documentation

## 2017-09-25 DIAGNOSIS — R51 Headache: Secondary | ICD-10-CM | POA: Diagnosis not present

## 2017-09-25 DIAGNOSIS — F10129 Alcohol abuse with intoxication, unspecified: Secondary | ICD-10-CM | POA: Diagnosis not present

## 2017-09-25 LAB — BASIC METABOLIC PANEL
Anion gap: 11 (ref 5–15)
BUN: 9 mg/dL (ref 6–20)
CHLORIDE: 106 mmol/L (ref 101–111)
CO2: 24 mmol/L (ref 22–32)
CREATININE: 0.7 mg/dL (ref 0.44–1.00)
Calcium: 9 mg/dL (ref 8.9–10.3)
GFR calc non Af Amer: >60 mL/min (ref >60)
Glucose, Bld: 86 mg/dL (ref 65–99)
Potassium: 4.1 mmol/L (ref 3.5–5.1)
Sodium: 141 mmol/L (ref 135–145)

## 2017-09-25 LAB — CBC
HCT: 40.4 % (ref 36.0–46.0)
Hemoglobin: 13.4 g/dL (ref 12.0–15.0)
MCH: 31.2 pg (ref 26.0–34.0)
MCHC: 33.2 g/dL (ref 30.0–36.0)
MCV: 94.2 fL (ref 78.0–100.0)
PLATELETS: 261 10*3/uL (ref 150–400)
RBC: 4.29 MIL/uL (ref 3.87–5.11)
RDW: 13.3 % (ref 11.5–15.5)
WBC: 4.7 10*3/uL (ref 4.0–10.5)

## 2017-09-25 LAB — URINALYSIS, ROUTINE W REFLEX MICROSCOPIC
Bilirubin Urine: NEGATIVE
Glucose, UA: NEGATIVE mg/dL
Hgb urine dipstick: NEGATIVE
KETONES UR: NEGATIVE mg/dL
Leukocytes, UA: NEGATIVE
Nitrite: NEGATIVE
PH: 6 (ref 5.0–8.0)
Protein, ur: NEGATIVE mg/dL
Specific Gravity, Urine: 1.019 (ref 1.005–1.030)

## 2017-09-25 LAB — I-STAT BETA HCG BLOOD, ED (MC, WL, AP ONLY)

## 2017-09-25 LAB — CBG MONITORING, ED: GLUCOSE-CAPILLARY: 78 mg/dL (ref 65–99)

## 2017-09-25 NOTE — ED Triage Notes (Signed)
Patient was hypotensive and dizziness. Patient has not eating all day. Patient was at a concert. This was about 1 am.

## 2017-09-25 NOTE — ED Provider Notes (Signed)
[facility]-EMERGENCY DEPT Provider Note   CSN: [id number] Arrival date & time: 09/25/17  0209     History   Chief Complaint Chief Complaint  Patient presents with  . Dizziness  . Hypotension    HPI Nicole Gallegos is a 32 y.o. female.  The history is provided by the patient and the spouse.  Dizziness  Quality:  Lightheadedness Severity:  Moderate Timing:  Constant Progression:  Improving Chronicity:  New Relieved by:  Being still Worsened by:  Standing up Associated symptoms: headaches and syncope   Associated symptoms: no chest pain, no shortness of breath and no vomiting    Patient with history of exercise-induced asthma presents with syncopal episode.  She reports minimal p.o. intake in the previous day.  She then went to a concert and drank alcohol.  She reports after the show she became lightheaded and had a brief syncopal episode.  No head injury.  No seizures reported.  No tongue lacerations.  She reports distant history of febrile seizures.  No current seizures at this time.  Denies any chest pain or shortness of breath She reports mild headache at this time Past Medical History:  Diagnosis Date  . Asthma    Exercise induced;inhaler prn  . Constipation   . Depression    No meds. currently  . Diabetes type 2, uncontrolled (HCC)   . History of blood transfusion    Accident at 7 yoa and lost a lot of blood  . Migraine   . Panic attacks    No meds currently;used to take meds in 2009  . Preterm labor 2010   2nd child;had to stop labor 2 times;due to stress  . Scoliosis   . Seizures (HCC)    if gets overheated;no meds, last seizure 6 yrs ago    Patient Active Problem List   Diagnosis Date Noted  . Eclampsia complicating pregnancy, delivered 07/10/2016  . Supervision of high risk pregnancy, antepartum 01/13/2016  . High risk pregnancy due to history of preterm labor, antepartum 01/13/2016  . Tension headache, chronic 04/21/2015  .  Migraine 04/21/2015  . Anxiety and depression 09/24/2012  . History of blood transfusion 09/24/2012  . Asthma, chronic 09/24/2012  . Prior pregnancy with fetal demise at 516 weeks 09/24/2012    Past Surgical History:  Procedure Laterality Date  . DILATION AND CURETTAGE OF UTERUS     Suction D&C to remove 16 wk. fetal demise  . FOOT SURGERY     R foot, repair torn tissue and skin graft  . TUBAL LIGATION Bilateral 07/02/2016   Procedure: POST PARTUM TUBAL LIGATION with filshie clips;  Surgeon: Tereso NewcomerUgonna A Anyanwu, MD;  Location: [facility];  Service: Gynecology;  Laterality: Bilateral;  . WISDOM TOOTH EXTRACTION  2008    OB History    Gravida Para Term Preterm AB Living   6 4 3 1 2 4    SAB TAB Ectopic Multiple Live Births   1 1 0 0 4       Home Medications    Prior to Admission medications   Medication Sig Start Date End Date Taking? Authorizing Provider  albuterol (PROVENTIL HFA;VENTOLIN HFA) 108 (90 Base) MCG/ACT inhaler Inhale 2 puffs into the lungs every 6 (six) hours as needed for wheezing or shortness of breath.    [provider]  lisinopril-hydrochlorothiazide (PRINZIDE,ZESTORETIC) 10-12.5 MG tablet Take 1 tablet by mouth daily. Patient not taking: Reported on 08/09/2016 07/12/16   [name], Jacob J, DO  predniSONE (DELTASONE) 10  MG tablet Take 2 tablets (20 mg total) by mouth 2 (two) times daily with a meal. Patient not taking: Reported on 09/25/2017 07/10/17   [name], NP    Family History Family History  Problem Relation Age of Onset  . Seizures Father        if gets overheated  . Heart disease Maternal Aunt        Hole in the heart  . Heart disease Maternal Uncle        Hole in the heart  . Heart attack Maternal Grandmother        Deceased  . Hypertension Maternal Grandmother   . Hypertension Maternal Uncle   . Hypertension Paternal Uncle   . Diabetes Paternal Grandmother   . Seizures Daughter        if gets overheated  . Seizures Daughter        if  gets overheated  . Bipolar disorder Cousin        maternal  . Depression Cousin        maternal  . Other Daughter        1st child had 2 vein umbilical cord; Pt did weekly NSTs.  . Anesthesia problems Neg Hx     Social History Social History   Tobacco Use  . Smoking status: Never Smoker  . Smokeless tobacco: Never Used  Substance Use Topics  . Alcohol use: No    Comment: Occasioanlly prior to pregnancy  . Drug use: No     Allergies   Chocolate   Review of Systems Review of Systems  Constitutional: Negative for fever.  Respiratory: Negative for shortness of breath.   Cardiovascular: Positive for syncope. Negative for chest pain.  Gastrointestinal: Negative for vomiting.  Neurological: Positive for dizziness and headaches. Negative for syncope.  All other systems reviewed and are negative.    Physical Exam Updated Vital Signs BP 105/73   Pulse 96   Temp 98.4 F (36.9 C) (Oral)   Resp 12   Ht 1.727 m (5\' 8" )   Wt 63.5 kg (140 lb)   LMP 09/22/2017   SpO2 100%   BMI 21.29 kg/m   Physical Exam  CONSTITUTIONAL: Well developed/well nourished HEAD: Normocephalic/atraumatic EYES: EOMI/PERRL, no nystagmus, no ptosis, ENMT: Mucous membranes moist NECK: supple no meningeal signs SPINE/BACK:entire spine nontender CV: S1/S2 noted, no murmurs/rubs/gallops noted LUNGS: Lungs are clear to auscultation bilaterally, no apparent distress ABDOMEN: soft, nontender, no rebound or guarding GU:no cva tenderness NEURO:Awake/alert, face symmetric, no arm or leg drift is noted Equal 5/5 strength with shoulder abduction, elbow flex/extension, wrist flex/extension in upper extremities and equal hand grips bilaterally Equal 5/5 strength with hip flexion,knee flex/extension, foot dorsi/plantar flexion Cranial nerves 3/4/5/6/01/16/09/11/12 tested and intact Gait normal without ataxia No past pointing Sensation to light touch intact in all extremities EXTREMITIES: pulses normal,  full ROM SKIN: warm, color normal PSYCH: no abnormalities of mood noted, alert and oriented to situation    ED Treatments / Results  Labs (all labs ordered are listed, but only abnormal results are displayed) Labs Reviewed  URINALYSIS, ROUTINE W REFLEX MICROSCOPIC - Abnormal; Notable for the following components:      Result Value   APPearance HAZY (*)    Bacteria, UA RARE (*)    Squamous Epithelial / LPF 6-30 (*)    All other components within normal limits  BASIC METABOLIC PANEL  CBC  CBG MONITORING, ED  I-STAT BETA HCG BLOOD, ED ([facility], [facility], [facility] ONLY)  EKG  EKG Interpretation  Date/Time:  Monday September 25 2017 02:47:25 EDT Ventricular Rate:  87 PR Interval:    QRS Duration: 80 QT Interval:  382 QTC Calculation: 460 R Axis:   71 Text Interpretation:  Sinus rhythm No significant change since last tracing Confirmed by Zadie Rhine ([id number]) on 09/25/2017 3:05:33 AM       Radiology No results found.  Procedures Procedures (including critical care time)  Medications Ordered in ED Medications - No data to display   Initial Impression / Assessment and Plan / ED Course  I have reviewed the triage vital signs and the nursing notes.  Pertinent labs  results that were available during my care of the patient were reviewed by me and considered in my medical decision making (see chart for details).     EKG unchanged.  Labs unrevealing. She can handle it without difficulty.  She wishes to be discharged. Suspect this is due to lack of p.o. intake and alcohol use. We will discharge home  Final Clinical Impressions(s) / ED Diagnoses   Final diagnoses:  Syncope and collapse    ED Discharge Orders    None       Zadie Rhine, MD 09/25/17 [phone]

## 2017-10-17 ENCOUNTER — Emergency Department (HOSPITAL_COMMUNITY)
Admission: EM | Admit: 2017-10-17 | Discharge: 2017-10-17 | Discharge status: Against Medical Advice | Payer: 59 | Attending: Emergency Medicine | Admitting: Emergency Medicine

## 2017-10-17 ENCOUNTER — Encounter (HOSPITAL_COMMUNITY): Payer: Self-pay | Admitting: Emergency Medicine

## 2017-10-17 DIAGNOSIS — R42 Dizziness and giddiness: Secondary | ICD-10-CM | POA: Diagnosis present

## 2017-10-17 DIAGNOSIS — Z5321 Procedure and treatment not carried out due to patient leaving prior to being seen by health care provider: Secondary | ICD-10-CM | POA: Diagnosis not present

## 2017-10-17 LAB — CBG MONITORING, ED: Glucose-Capillary: 75 mg/dL (ref 65–99)

## 2017-10-17 MED ORDER — ONDANSETRON 4 MG PO TBDP
4.0000 mg | ORAL_TABLET | Freq: Once | ORAL | Status: AC | PRN
  Administered 2017-10-17: 4 mg via ORAL
  Filled 2017-10-17: qty 1

## 2017-10-17 NOTE — ED Notes (Signed)
Called for pt no answer

## 2017-10-17 NOTE — ED Triage Notes (Signed)
Patient presents ambulatory stating she has felt dizzy and nauseas since yesterday. Now is having mid abdominal pain onset of this am. No episodes of emesis. Has not eaten today.

## 2017-10-17 NOTE — ED Notes (Signed)
No answer for blood draw @ this time. 

## 2017-10-23 DIAGNOSIS — Z136 Encounter for screening for cardiovascular disorders: Secondary | ICD-10-CM | POA: Diagnosis not present

## 2017-10-23 DIAGNOSIS — F439 Reaction to severe stress, unspecified: Secondary | ICD-10-CM | POA: Diagnosis not present

## 2017-10-23 DIAGNOSIS — Z1383 Encounter for screening for respiratory disorder NEC: Secondary | ICD-10-CM | POA: Diagnosis not present

## 2017-10-23 DIAGNOSIS — J45909 Unspecified asthma, uncomplicated: Secondary | ICD-10-CM | POA: Diagnosis not present

## 2017-10-23 DIAGNOSIS — Z131 Encounter for screening for diabetes mellitus: Secondary | ICD-10-CM | POA: Diagnosis not present

## 2017-10-23 DIAGNOSIS — Z0000 Encounter for general adult medical examination without abnormal findings: Secondary | ICD-10-CM | POA: Diagnosis not present

## 2017-10-23 DIAGNOSIS — R7303 Prediabetes: Secondary | ICD-10-CM | POA: Diagnosis not present

## 2017-10-23 DIAGNOSIS — E559 Vitamin D deficiency, unspecified: Secondary | ICD-10-CM | POA: Diagnosis not present

## 2017-10-23 DIAGNOSIS — Z0110 Encounter for examination of ears and hearing without abnormal findings: Secondary | ICD-10-CM | POA: Diagnosis not present

## 2017-10-24 ENCOUNTER — Encounter: Payer: Self-pay | Admitting: *Deleted

## 2017-11-06 DIAGNOSIS — E559 Vitamin D deficiency, unspecified: Secondary | ICD-10-CM | POA: Diagnosis not present

## 2017-11-06 DIAGNOSIS — F439 Reaction to severe stress, unspecified: Secondary | ICD-10-CM | POA: Diagnosis not present

## 2017-11-06 DIAGNOSIS — J45909 Unspecified asthma, uncomplicated: Secondary | ICD-10-CM | POA: Diagnosis not present

## 2017-11-06 DIAGNOSIS — R7303 Prediabetes: Secondary | ICD-10-CM | POA: Diagnosis not present

## 2017-12-07 DIAGNOSIS — J45909 Unspecified asthma, uncomplicated: Secondary | ICD-10-CM | POA: Diagnosis not present

## 2017-12-07 DIAGNOSIS — F439 Reaction to severe stress, unspecified: Secondary | ICD-10-CM | POA: Diagnosis not present

## 2017-12-07 DIAGNOSIS — E559 Vitamin D deficiency, unspecified: Secondary | ICD-10-CM | POA: Diagnosis not present

## 2017-12-07 DIAGNOSIS — R7303 Prediabetes: Secondary | ICD-10-CM | POA: Diagnosis not present

## 2018-02-06 IMAGING — US US OB TRANSVAGINAL
1 series · 15 of 28 positions shown · non-contrast
Comparison: Pelvic ultrasound November 14, 2015

CLINICAL DATA: Assess viability of early pregnancy as well as
anatomic location

EXAM:
TRANSVAGINAL OB ULTRASOUND
TECHNIQUE: Transvaginal ultrasound was performed for complete evaluation of the
gestation as well as the maternal uterus, adnexal regions, and
pelvic cul-de-sac.

[Series 1: us ob transvaginal · 15 of 50 slices shown]
[im 1/50]
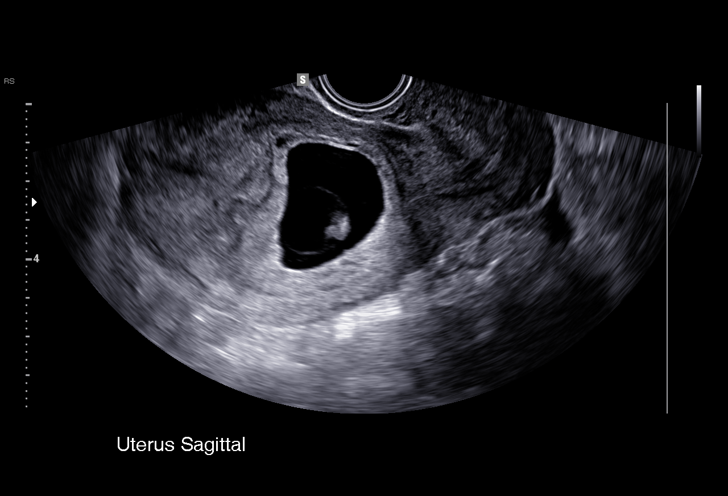
[im 4/50]
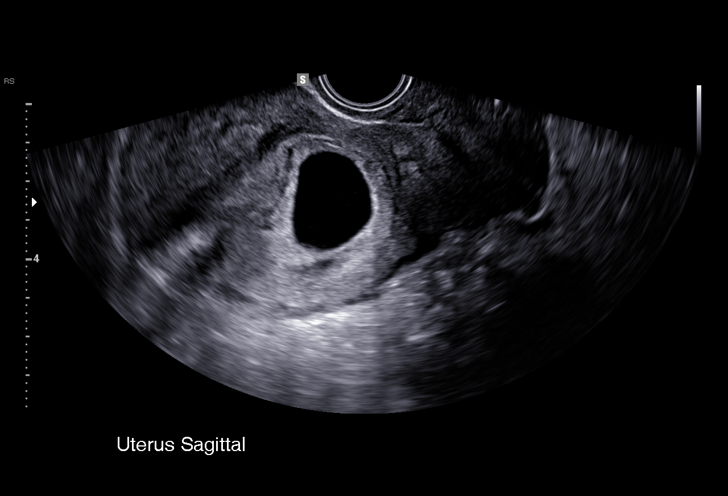
[im 8/50]
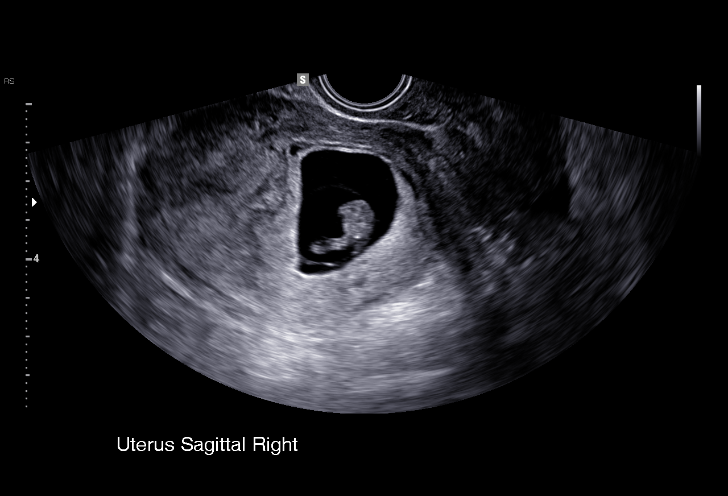
[im 11/50]
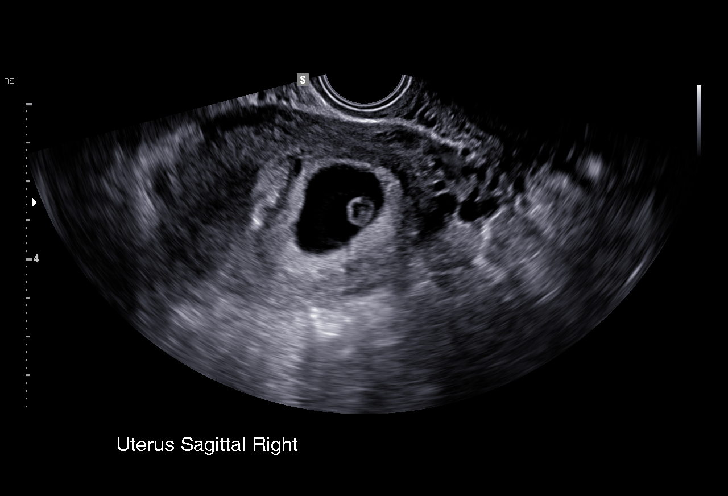
[im 15/50]
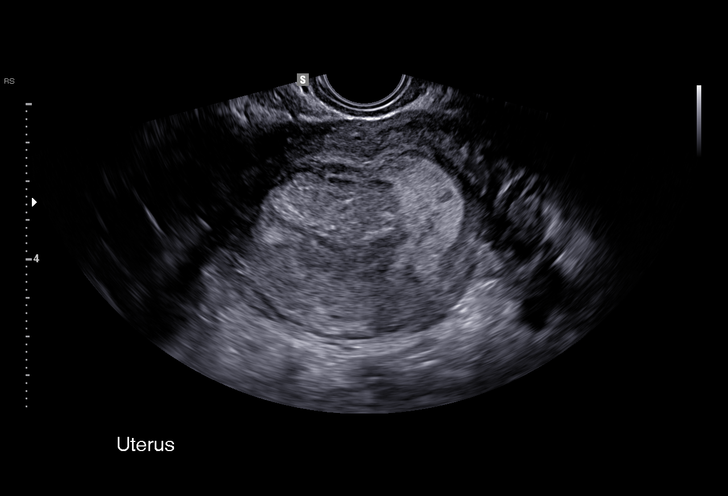
[im 19/50]
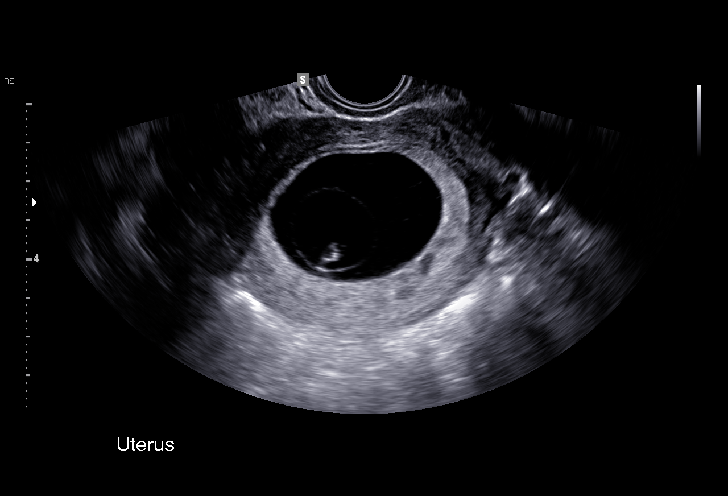
[im 22/50]
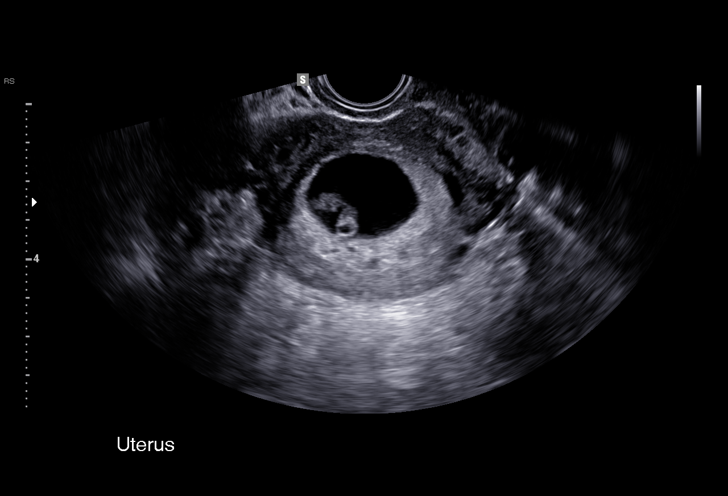
[im 26/50]
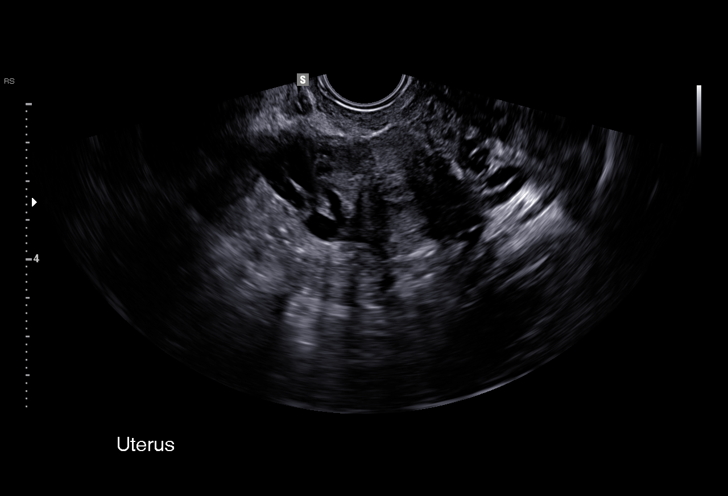
[im 28/50]
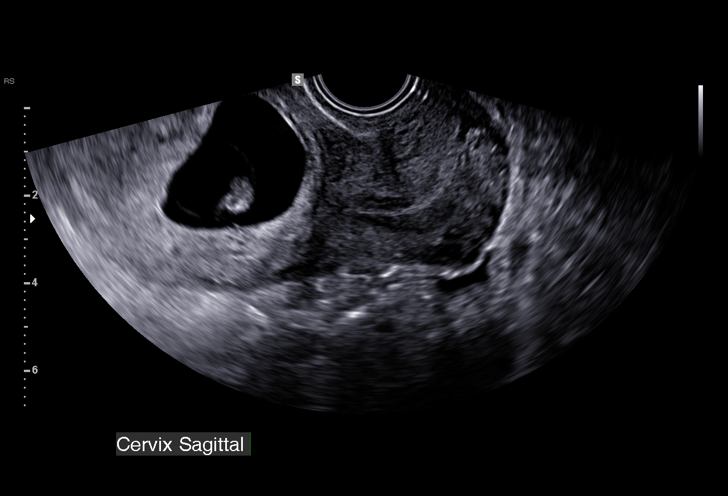
[im 31/50]
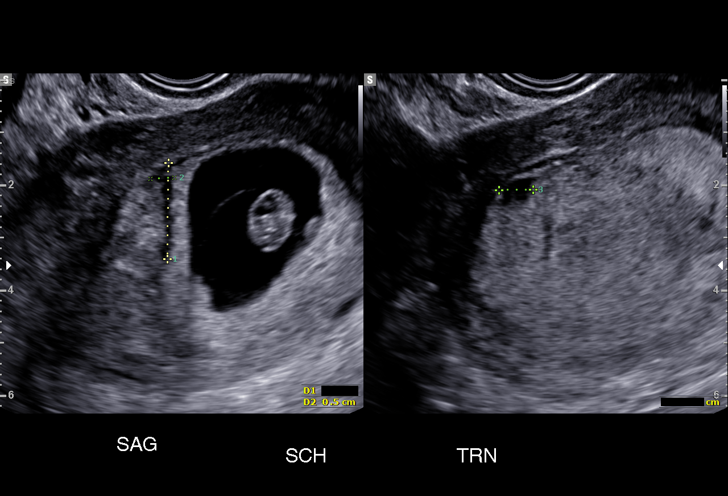
[im 35/50]
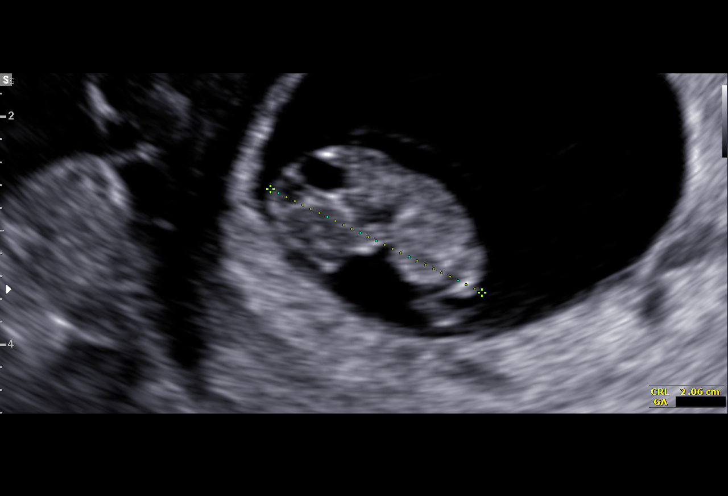
[im 39/50]
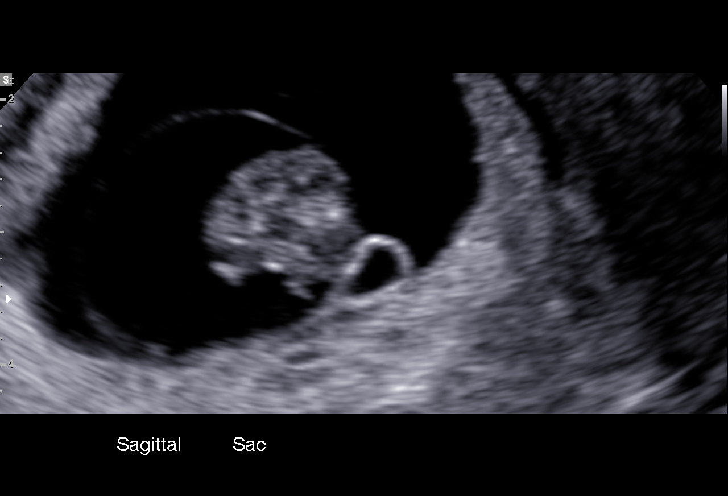
[im 42/50]
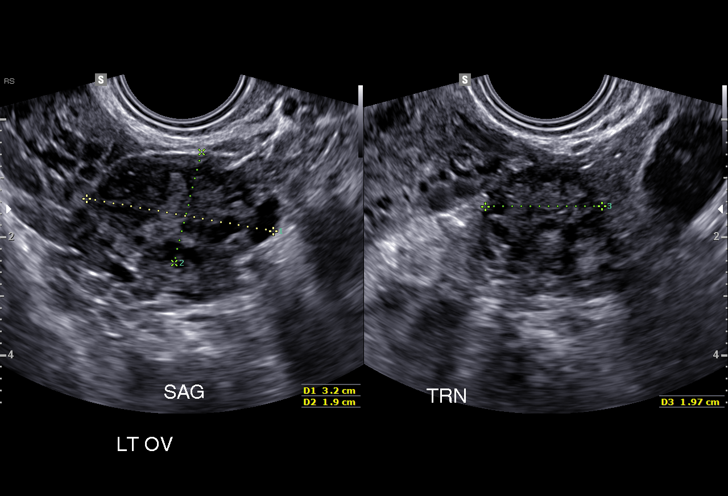
[im 46/50]
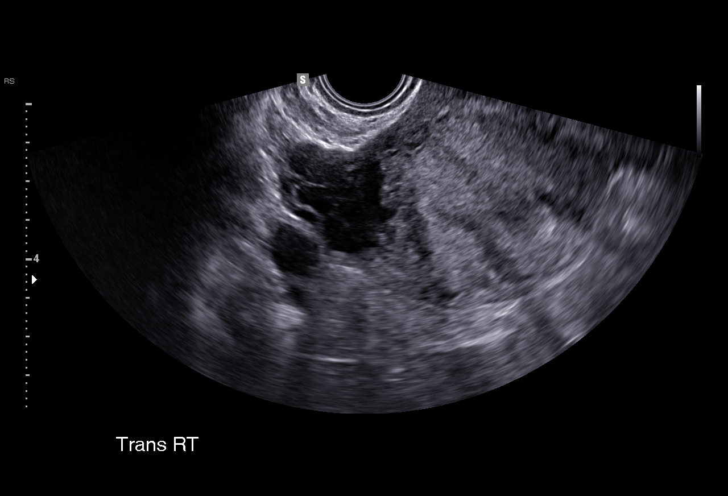
[im 50/50]
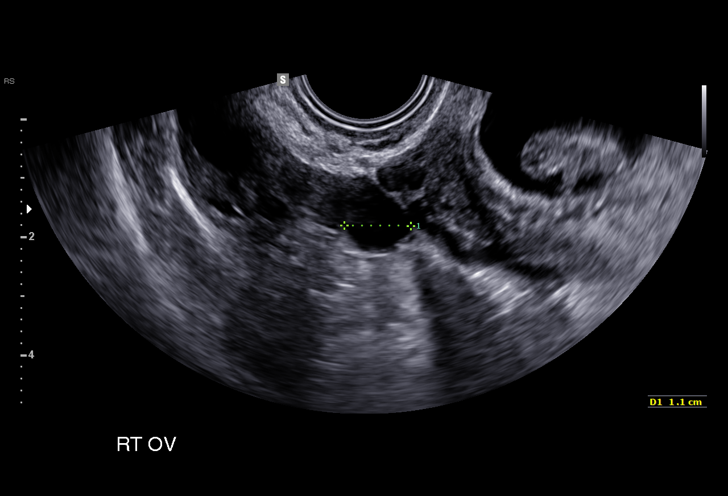

[15 of 28 positions shown; findings below may reference images not displayed]

FINDINGS: Intrauterine gestational sac: Single

Yolk sac:  Present

Embryo:  Present

Cardiac Activity: Present

Heart Rate: 164 bpm

CRL:   2.07  cm   8 w 5 d                  US EDC: July 17, 2016

Subchorionic hemorrhage: There is a subchorionic hemorrhage which
measures 1.8 x 0.5 x 0.7 cm

Maternal uterus/adnexae: The right ovary exhibits an eccentrically
located simple cyst measuring 1.4 cm in greatest dimension. The left
ovary is unremarkable. There is no free pelvic fluid.
IMPRESSION: Single viable IUP with estimated gestational age of 8 weeks 5 days.
The estimated date of confinement is July 17, 2016. There is a
subchorionic hemorrhage measuring up to 1.8 cm in greatest
dimension. There is a right-sided paraovarian cyst.

## 2018-03-27 ENCOUNTER — Encounter (HOSPITAL_COMMUNITY): Payer: Self-pay

## 2018-03-27 ENCOUNTER — Emergency Department (HOSPITAL_COMMUNITY)
Admission: EM | Admit: 2018-03-27 | Discharge: 2018-03-27 | Disposition: A | Discharge status: Home / Self Care | Payer: 59 | Attending: Emergency Medicine | Admitting: Emergency Medicine

## 2018-03-27 DIAGNOSIS — E119 Type 2 diabetes mellitus without complications: Secondary | ICD-10-CM | POA: Diagnosis not present

## 2018-03-27 DIAGNOSIS — J45909 Unspecified asthma, uncomplicated: Secondary | ICD-10-CM | POA: Insufficient documentation

## 2018-03-27 DIAGNOSIS — Z79899 Other long term (current) drug therapy: Secondary | ICD-10-CM | POA: Diagnosis not present

## 2018-03-27 DIAGNOSIS — L03011 Cellulitis of right finger: Secondary | ICD-10-CM | POA: Diagnosis not present

## 2018-03-27 DIAGNOSIS — M79644 Pain in right finger(s): Secondary | ICD-10-CM | POA: Diagnosis present

## 2018-03-27 MED ORDER — CEPHALEXIN 500 MG PO CAPS: 500.0000 mg | ORAL_CAPSULE | Freq: Four times a day (QID) | ORAL | 0 refills | Status: AC

## 2018-03-27 MED ORDER — CEPHALEXIN 500 MG PO CAPS
500.0000 mg | ORAL_CAPSULE | Freq: Once | ORAL | Status: AC
  Administered 2018-03-27: 500 mg via ORAL
  Filled 2018-03-27: qty 1

## 2018-03-27 MED ORDER — LIDOCAINE HCL 2 % IJ SOLN
10.0000 mL | Freq: Once | INTRAMUSCULAR | Status: AC
  Administered 2018-03-27: 200 mg via INTRADERMAL
  Filled 2018-03-27: qty 20

## 2018-03-27 NOTE — ED Triage Notes (Signed)
Pt pulled a hang nail one week ago from her middle  finger on her right hand, now her finger is swollen and she states that pain is going up her arm

## 2018-03-27 NOTE — ED Provider Notes (Signed)
[facility]-EMERGENCY DEPT Provider Note   CSN: [id number] Arrival date & time: 03/27/18  1934     History   Chief Complaint Chief Complaint  Patient presents with  . Finger Injury    HPI Vernelle Manson PasseyBrown is a 32 y.o. female.  HPI  Deirde Manson PasseyBrown is a 32yo female with a history of prediabetes, migraines and panic attacks who presents to the emergency department for evaluation of right middle finger pain.  Patient reports that she pulled off a hangnail about 2 weeks ago and has had pain over the side of her right nail ever since.  She noticed some swelling and purulent drainage earlier today.  She reports pain is worsened with palpation or with trying to grasp a pen or pencil.  She denies pain or swelling in the pad of the finger.  Denies injury.  Denies fevers, chills, numbness, weakness, pain or swelling elsewhere. She does not bite her nails.  Past Medical History:  Diagnosis Date  . Asthma    Exercise induced;inhaler prn  . Constipation   . Depression    No meds. currently  . Diabetes type 2, uncontrolled (HCC)   . History of blood transfusion    Accident at 7 yoa and lost a lot of blood  . Migraine   . Panic attacks    No meds currently;used to take meds in 2009  . Preterm labor 2010   2nd child;had to stop labor 2 times;due to stress  . Scoliosis   . Seizures (HCC)    if gets overheated;no meds, last seizure 6 yrs ago    Patient Active Problem List   Diagnosis Date Noted  . Eclampsia complicating pregnancy, delivered 07/10/2016  . Supervision of high risk pregnancy, antepartum 01/13/2016  . High risk pregnancy due to history of preterm labor, antepartum 01/13/2016  . Tension headache, chronic 04/21/2015  . Migraine 04/21/2015  . Anxiety and depression 09/24/2012  . History of blood transfusion 09/24/2012  . Asthma, chronic 09/24/2012  . Prior pregnancy with fetal demise at 9016 weeks 09/24/2012    Past Surgical History:  Procedure Laterality  Date  . DILATION AND CURETTAGE OF UTERUS     Suction D&C to remove 16 wk. fetal demise  . FOOT SURGERY     R foot, repair torn tissue and skin graft  . TUBAL LIGATION Bilateral 07/02/2016   Procedure: POST PARTUM TUBAL LIGATION with filshie clips;  Surgeon: Tereso NewcomerUgonna A Anyanwu, MD;  Location: [facility];  Service: Gynecology;  Laterality: Bilateral;  . WISDOM TOOTH EXTRACTION  2008     OB History    Gravida  6   Para  4   Term  3   Preterm  1   AB  2   Living  4     SAB  1   TAB  1   Ectopic  0   Multiple  0   Live Births  4            Home Medications    Prior to Admission medications   Medication Sig Start Date End Date Taking? Authorizing Provider  albuterol (PROVENTIL HFA;VENTOLIN HFA) 108 (90 Base) MCG/ACT inhaler Inhale 2 puffs into the lungs every 6 (six) hours as needed for wheezing or shortness of breath.    [provider]  lisinopril-hydrochlorothiazide (PRINZIDE,ZESTORETIC) 10-12.5 MG tablet Take 1 tablet by mouth daily. Patient not taking: Reported on 08/09/2016 07/12/16   [name], Jacob J, DO  predniSONE (DELTASONE) 10 MG tablet  Take 2 tablets (20 mg total) by mouth 2 (two) times daily with a meal. Patient not taking: Reported on 09/25/2017 07/10/17   [name], NP    Family History Family History  Problem Relation Age of Onset  . Seizures Father        if gets overheated  . Heart disease Maternal Aunt        Hole in the heart  . Heart disease Maternal Uncle        Hole in the heart  . Heart attack Maternal Grandmother        Deceased  . Hypertension Maternal Grandmother   . Hypertension Maternal Uncle   . Hypertension Paternal Uncle   . Diabetes Paternal Grandmother   . Seizures Daughter        if gets overheated  . Seizures Daughter        if gets overheated  . Bipolar disorder Cousin        maternal  . Depression Cousin        maternal  . Other Daughter        1st child had 2 vein umbilical cord; Pt did weekly NSTs.  .  Anesthesia problems Neg Hx     Social History Social History   Tobacco Use  . Smoking status: Never Smoker  . Smokeless tobacco: Never Used  Substance Use Topics  . Alcohol use: No    Comment: Occasioanlly prior to pregnancy  . Drug use: No     Allergies   Chocolate   Review of Systems Review of Systems  Constitutional: Negative for chills and fever.  Musculoskeletal: Positive for arthralgias.  Skin: Positive for color change (erythema and swelling lateral to nail). Negative for wound.  Neurological: Negative for weakness and numbness.     Physical Exam Updated Vital Signs BP 112/80 (BP Location: Left Arm)   Pulse 74   Temp 98.5 F (36.9 C) (Oral)   Resp 18   LMP 02/09/2018   SpO2 100%   Physical Exam  Constitutional: She is oriented to person, place, and time. She appears well-developed and well-nourished. No distress.  HENT:  Head: Normocephalic and atraumatic.  Eyes: Right eye exhibits no discharge. Left eye exhibits no discharge.  Pulmonary/Chest: Effort normal. No respiratory distress.  Musculoskeletal:       Hands: Neurological: She is alert and oriented to person, place, and time. Coordination normal.  Skin: Skin is warm and dry. Capillary refill takes less than 2 seconds. She is not diaphoretic.  Psychiatric: She has a normal mood and affect. Her behavior is normal.  Nursing note and vitals reviewed.    ED Treatments / Results  Labs (all labs ordered are listed, but only abnormal results are displayed) Labs Reviewed - No data to display  EKG None  Radiology No results found.  Procedures Drain paronychia Date/Time: 03/27/2018 9:48 PM Performed by: Kellie Shropshire, PA-C Authorized by: Kellie Shropshire, PA-C  Consent: Verbal consent obtained. Consent given by: patient Patient understanding: patient states understanding of the procedure being performed Patient consent: the patient's understanding of the procedure matches consent  given Relevant documents: relevant documents present and verified Patient identity confirmed: verbally with patient Time out: Immediately prior to procedure a "time out" was called to verify the correct patient, procedure, equipment, support staff and site/side marked as required. Preparation: Patient was prepped and draped in the usual sterile fashion. Local anesthesia used: yes  Anesthesia: Local anesthesia used: yes Local Anesthetic: lidocaine 2% without  epinephrine Anesthetic total: 2 mL  Sedation: Patient sedated: no  Patient tolerance: Patient tolerated the procedure well with no immediate complications    (including critical care time)  Medications Ordered in ED Medications  lidocaine (XYLOCAINE) 2 % (with pres) injection 200 mg (has no administration in time range)  cephALEXin (KEFLEX) capsule 500 mg (has no administration in time range)     Initial Impression / Assessment and Plan / ED Course  I have reviewed the triage vital signs and the nursing notes.  Pertinent labs & imaging results that were available during my care of the patient were reviewed by me and considered in my medical decision making (see chart for details).     Patient with paronychia amenable to incision and drainage.  No swelling or tenderness over the pad of the finger to suggest felon.  Will discharge home with Keflex.  I have counseled her on warm soaks.  We discussed return precautions and she agrees and appears reliable.  Final Clinical Impressions(s) / ED Diagnoses   Final diagnoses:  Paronychia of finger, right    ED Discharge Orders         Ordered    cephALEXin (KEFLEX) 500 MG capsule  4 times daily     03/27/18 2152           Lawrence Marseilles 03/27/18 2152    Jacalyn Lefevre, MD 03/27/18 2154

## 2018-03-27 NOTE — Discharge Instructions (Signed)
You have a small infection next to your nail.  Please take antibiotic 4 times a day for the next 5 days until they are completely gone.  Soak your hand in warm water 3 times a day for at least 20 minutes.  The incision on your hand will continue to drain for a couple of days, this is normal.  Return to the emergency department immediately if you have fever, have worsening redness or swelling down the finger or over the pad of the finger.

## 2018-05-07 ENCOUNTER — Encounter: Payer: Self-pay | Admitting: *Deleted

## 2018-05-10 DIAGNOSIS — J45909 Unspecified asthma, uncomplicated: Secondary | ICD-10-CM | POA: Diagnosis not present

## 2018-05-10 DIAGNOSIS — R7303 Prediabetes: Secondary | ICD-10-CM | POA: Diagnosis not present

## 2018-05-10 DIAGNOSIS — E559 Vitamin D deficiency, unspecified: Secondary | ICD-10-CM | POA: Diagnosis not present

## 2018-05-10 DIAGNOSIS — F439 Reaction to severe stress, unspecified: Secondary | ICD-10-CM | POA: Diagnosis not present

## 2018-06-11 DIAGNOSIS — R7303 Prediabetes: Secondary | ICD-10-CM | POA: Diagnosis not present

## 2018-06-11 DIAGNOSIS — Z113 Encounter for screening for infections with a predominantly sexual mode of transmission: Secondary | ICD-10-CM | POA: Diagnosis not present

## 2018-06-11 DIAGNOSIS — F439 Reaction to severe stress, unspecified: Secondary | ICD-10-CM | POA: Diagnosis not present

## 2018-06-11 DIAGNOSIS — J45909 Unspecified asthma, uncomplicated: Secondary | ICD-10-CM | POA: Diagnosis not present

## 2018-06-11 DIAGNOSIS — E559 Vitamin D deficiency, unspecified: Secondary | ICD-10-CM | POA: Diagnosis not present

## 2018-06-14 DIAGNOSIS — Z113 Encounter for screening for infections with a predominantly sexual mode of transmission: Secondary | ICD-10-CM | POA: Diagnosis not present

## 2018-06-14 DIAGNOSIS — Z202 Contact with and (suspected) exposure to infections with a predominantly sexual mode of transmission: Secondary | ICD-10-CM | POA: Diagnosis not present

## 2018-06-14 DIAGNOSIS — Z114 Encounter for screening for human immunodeficiency virus [HIV]: Secondary | ICD-10-CM | POA: Diagnosis not present

## 2018-06-25 ENCOUNTER — Telehealth: Payer: Self-pay | Admitting: General Practice

## 2018-06-25 NOTE — Telephone Encounter (Signed)
Patient called & left message on nurse voicemail line stating she wants to know what kind of BTL she had. Called patient and she states she already spoke with someone about an appt. Patient states she has been having pains down there in her tubal area for the past couple of weeks and hasn't felt well. Patient states she was worried it was related. Told patient she did have clips placed. Discussed pain is likely unrelated to that but likely something else. Patient verbalized understanding & asked if hypothetically clips could be removed. Discussed with patient that is usually considered tubal reversal which isn't something we do and likely wouldn't be covered by insurance. Patient verbalized understanding & had no other questions.

## 2018-07-09 ENCOUNTER — Encounter (HOSPITAL_COMMUNITY): Payer: Self-pay | Admitting: *Deleted

## 2018-07-09 ENCOUNTER — Inpatient Hospital Stay (HOSPITAL_COMMUNITY)
Admission: AD | Admit: 2018-07-09 | Discharge: 2018-07-09 | Disposition: A | Discharge status: Home / Self Care | Payer: 59 | Attending: Obstetrics & Gynecology | Admitting: Obstetrics & Gynecology

## 2018-07-09 ENCOUNTER — Inpatient Hospital Stay (HOSPITAL_COMMUNITY): Payer: 59

## 2018-07-09 DIAGNOSIS — R102 Pelvic and perineal pain unspecified side: Secondary | ICD-10-CM | POA: Diagnosis present

## 2018-07-09 DIAGNOSIS — R110 Nausea: Secondary | ICD-10-CM | POA: Diagnosis not present

## 2018-07-09 DIAGNOSIS — M419 Scoliosis, unspecified: Secondary | ICD-10-CM | POA: Diagnosis not present

## 2018-07-09 DIAGNOSIS — F329 Major depressive disorder, single episode, unspecified: Secondary | ICD-10-CM | POA: Insufficient documentation

## 2018-07-09 DIAGNOSIS — F410 Panic disorder [episodic paroxysmal anxiety] without agoraphobia: Secondary | ICD-10-CM | POA: Diagnosis not present

## 2018-07-09 DIAGNOSIS — Z79899 Other long term (current) drug therapy: Secondary | ICD-10-CM | POA: Diagnosis not present

## 2018-07-09 DIAGNOSIS — J45909 Unspecified asthma, uncomplicated: Secondary | ICD-10-CM | POA: Diagnosis not present

## 2018-07-09 DIAGNOSIS — Z818 Family history of other mental and behavioral disorders: Secondary | ICD-10-CM | POA: Diagnosis not present

## 2018-07-09 DIAGNOSIS — E119 Type 2 diabetes mellitus without complications: Secondary | ICD-10-CM | POA: Diagnosis not present

## 2018-07-09 DIAGNOSIS — Z833 Family history of diabetes mellitus: Secondary | ICD-10-CM | POA: Diagnosis not present

## 2018-07-09 DIAGNOSIS — Z91018 Allergy to other foods: Secondary | ICD-10-CM | POA: Diagnosis not present

## 2018-07-09 LAB — URINALYSIS, ROUTINE W REFLEX MICROSCOPIC
Bilirubin Urine: NEGATIVE
Glucose, UA: NEGATIVE mg/dL
Hgb urine dipstick: NEGATIVE
Ketones, ur: NEGATIVE mg/dL
LEUKOCYTES UA: NEGATIVE
Nitrite: NEGATIVE
PROTEIN: NEGATIVE mg/dL
Specific Gravity, Urine: 1.025 (ref 1.005–1.030)
pH: 5 (ref 5.0–8.0)

## 2018-07-09 LAB — WET PREP, GENITAL
Sperm: NONE SEEN
TRICH WET PREP: NONE SEEN
YEAST WET PREP: NONE SEEN

## 2018-07-09 LAB — CBC WITH DIFFERENTIAL/PLATELET
Basophils Absolute: 0 10*3/uL (ref 0.0–0.1)
Basophils Relative: 0 %
EOS PCT: 0 %
Eosinophils Absolute: 0 10*3/uL (ref 0.0–0.5)
HCT: 40.7 % (ref 36.0–46.0)
Hemoglobin: 13.4 g/dL (ref 12.0–15.0)
LYMPHS PCT: 27 %
Lymphs Abs: 1.6 10*3/uL (ref 0.7–4.0)
MCH: 31 pg (ref 26.0–34.0)
MCHC: 32.9 g/dL (ref 30.0–36.0)
MCV: 94.2 fL (ref 80.0–100.0)
MONO ABS: 0.3 10*3/uL (ref 0.1–1.0)
MONOS PCT: 5 %
NEUTROS ABS: 4.2 10*3/uL (ref 1.7–7.7)
Neutrophils Relative %: 68 %
PLATELETS: 227 10*3/uL (ref 150–400)
RBC: 4.32 MIL/uL (ref 3.87–5.11)
RDW: 13.1 % (ref 11.5–15.5)
WBC: 6.1 10*3/uL (ref 4.0–10.5)
nRBC: 0 % (ref 0.0–0.2)

## 2018-07-09 LAB — POCT PREGNANCY, URINE: PREG TEST UR: NEGATIVE

## 2018-07-09 MED ORDER — KETOROLAC TROMETHAMINE 60 MG/2ML IM SOLN
60.0000 mg | INTRAMUSCULAR | Status: AC
  Administered 2018-07-09: 60 mg via INTRAMUSCULAR
  Filled 2018-07-09: qty 2

## 2018-07-09 MED ORDER — IBUPROFEN 800 MG PO TABS: 800.0000 mg | ORAL_TABLET | Freq: Three times a day (TID) | ORAL | 0 refills | Status: DC

## 2018-07-09 NOTE — MAU Note (Signed)
Patient had a tubal two years ago, but reports missing a period in October and November and only bleeding x1 day in December.  For past three weeks has had lower abdominal cramping and back pain.  Today c/o nausea and dizziness as well.  Pain is 8/10.  Patient has taken Tylenol with minimal relief.  Has appt with MD who did the tubal, but it is not until next month.

## 2018-07-09 NOTE — MAU Provider Note (Signed)
History     CSN: [id number]  Arrival date and time: 07/09/18 1001   First Provider Initiated Contact with Patient 07/09/18 1047      Chief Complaint  Patient presents with  . Abdominal Pain  . Nausea  . Back Pain   HPI  Ms.  Nicole Gallegos is a 32 y.o. year old [phone] non-pregnant female who presents to MAU reporting missed period in October and November, VB x 1 day in December, lower abdominal and lower back pain x 3 wks, nausea and dizziness today. She reports that Tylenol only minimally helps the pain. She had a BTL in 2017 by Dr. Macon LargeAnyanwu; [facility]  Past Medical History:  Diagnosis Date  . Asthma    Exercise induced;inhaler prn  . Constipation   . Depression    No meds. currently  . Diabetes type 2, uncontrolled (HCC)   . History of blood transfusion    Accident at 7 yoa and lost a lot of blood  . Migraine   . Panic attacks    No meds currently;used to take meds in 2009  . Preterm labor 2010   2nd child;had to stop labor 2 times;due to stress  . Scoliosis   . Seizures (HCC)    if gets overheated;no meds, last seizure 6 yrs ago    Past Surgical History:  Procedure Laterality Date  . DILATION AND CURETTAGE OF UTERUS     Suction D&C to remove 16 wk. fetal demise  . FOOT SURGERY     R foot, repair torn tissue and skin graft  . TUBAL LIGATION Bilateral 07/02/2016   Procedure: POST PARTUM TUBAL LIGATION with filshie clips;  Surgeon: Tereso NewcomerUgonna A Anyanwu, MD;  Location: [facility];  Service: Gynecology;  Laterality: Bilateral;  . WISDOM TOOTH EXTRACTION  2008    Family History  Problem Relation Age of Onset  . Seizures Father        if gets overheated  . Heart disease Maternal Aunt        Hole in the heart  . Heart disease Maternal Uncle        Hole in the heart  . Heart attack Maternal Grandmother        Deceased  . Hypertension Maternal Grandmother   . Hypertension Maternal Uncle   . Hypertension Paternal Uncle   . Diabetes  Paternal Grandmother   . Seizures Daughter        if gets overheated  . Seizures Daughter        if gets overheated  . Bipolar disorder Cousin        maternal  . Depression Cousin        maternal  . Other Daughter        1st child had 2 vein umbilical cord; Pt did weekly NSTs.  . Anesthesia problems Neg Hx     Social History   Tobacco Use  . Smoking status: Never Smoker  . Smokeless tobacco: Never Used  Substance Use Topics  . Alcohol use: Yes    Comment: occasionally  . Drug use: No    Allergies:  Allergies  Allergen Reactions  . Chocolate Hives    Medications Prior to Admission  Medication Sig Dispense Refill Last Dose  . albuterol (PROVENTIL HFA;VENTOLIN HFA) 108 (90 Base) MCG/ACT inhaler Inhale 2 puffs into the lungs every 6 (six) hours as needed for wheezing or shortness of breath.   unknown  . lisinopril-hydrochlorothiazide (PRINZIDE,ZESTORETIC) 10-12.5 MG tablet  Take 1 tablet by mouth daily. (Patient not taking: Reported on 08/09/2016) 30 tablet 3 Not Taking at Unknown time  . predniSONE (DELTASONE) 10 MG tablet Take 2 tablets (20 mg total) by mouth 2 (two) times daily with a meal. (Patient not taking: Reported on 09/25/2017) 16 tablet 0 Completed Course at Unknown time    Review of Systems  Constitutional: Negative.   HENT: Negative.   Eyes: Negative.   Respiratory: Negative.   Cardiovascular: Negative.   Gastrointestinal: Positive for abdominal pain.  Endocrine: Negative.   Genitourinary: Positive for menstrual problem (missed period in Oct & Nov, only had VB x 1 day in Dec) and pelvic pain.  Musculoskeletal: Negative.   Skin: Negative.   Allergic/Immunologic: Negative.   Neurological: Negative.   Hematological: Negative.   Psychiatric/Behavioral: Negative.    Physical Exam   Blood pressure 115/71, pulse 80, temperature 98.1 F (36.7 C), temperature source Oral, resp. rate 16, weight 63.5 kg, last menstrual period 04/09/2018, SpO2 100 %.  Physical  Exam  Nursing note and vitals reviewed. Constitutional: She is oriented to person, place, and time. She appears well-developed and well-nourished.  HENT:  Head: Normocephalic and atraumatic.  Eyes: Pupils are equal, round, and reactive to light.  Neck: Normal range of motion.  Cardiovascular: Normal rate.  Respiratory: Effort normal.  GI: Soft.  Genitourinary:    Genitourinary Comments: Uterus: mildly tender, SE: cervix is smooth, pink, no lesions, small amt of thick, white vaginal d/c -- WP, GC/CT done, closed/long/firm, no CMT or friability, moderate bilateral adnexal tenderness    Musculoskeletal: Normal range of motion.  Neurological: She is alert and oriented to person, place, and time.  Skin: Skin is warm and dry.  Psychiatric: She has a normal mood and affect. Her behavior is normal. Judgment and thought content normal.    MAU Course  Procedures  MDM [facility] UPT CBC w/Diff Wet Prep GC/CT -- pending HIV -- pending Toradol 60 mg IM injection -- pain decreased from 8/10 to 6/10 before U/S, but more improvement since returning from U/S   Results for orders placed or performed during the hospital encounter of 07/09/18 (from the past 24 hour(s))  Urinalysis, Routine w reflex microscopic     Status: Abnormal   Collection Time: 07/09/18 10:14 AM  Result Value Ref Range   Color, Urine YELLOW YELLOW   APPearance HAZY (A) CLEAR   Specific Gravity, Urine 1.025 1.005 - 1.030   pH 5.0 5.0 - 8.0   Glucose, UA NEGATIVE NEGATIVE mg/dL   Hgb urine dipstick NEGATIVE NEGATIVE   Bilirubin Urine NEGATIVE NEGATIVE   Ketones, ur NEGATIVE NEGATIVE mg/dL   Protein, ur NEGATIVE NEGATIVE mg/dL   Nitrite NEGATIVE NEGATIVE   Leukocytes, UA NEGATIVE NEGATIVE  Pregnancy, urine POC     Status: None   Collection Time: 07/09/18 10:16 AM  Result Value Ref Range   Preg Test, Ur NEGATIVE NEGATIVE  CBC with Differential/Platelet     Status: None (Preliminary result)   Collection Time: 07/09/18 10:53  AM  Result Value Ref Range   WBC 6.1 4.0 - 10.5 K/uL   RBC 4.32 3.87 - 5.11 MIL/uL   Hemoglobin 13.4 12.0 - 15.0 g/dL   HCT 16.140.7 09.636.0 - 04.546.0 %   MCV 94.2 80.0 - 100.0 fL   MCH 31.0 26.0 - 34.0 pg   MCHC 32.9 30.0 - 36.0 g/dL   RDW 40.913.1 81.111.5 - 91.415.5 %   Platelets 227 150 - 400 K/uL   nRBC 0.0 0.0 -  0.2 %   Neutrophils Relative % 68 %   Neutro Abs 4.2 1.7 - 7.7 K/uL   Lymphocytes Relative 27 %   Lymphs Abs 1.6 0.7 - 4.0 K/uL   Monocytes Relative 5 %   Monocytes Absolute 0.3 0.1 - 1.0 K/uL   Eosinophils Relative 0 %   Eosinophils Absolute 0.0 0.0 - 0.5 K/uL   Basophils Relative 0 %   Basophils Absolute 0.0 0.0 - 0.1 K/uL   Other PENDING %    US Pelvic Complete W Transvaginal And Torsion R/o  Result Date: 07/09/2018 CLINICAL DATA:  Pelvic pain EXAM: TRANSABDOMINAL AND TRANSVAGINAL ULTRASOUND OF PELVIS TECHNIQUE: Both transabdominal and transvaginal ultrasound examinations of the pelvis were performed. Transabdominal technique was performed for global imaging of the pelvis including uterus, ovaries, adnexal regions, and pelvic cul-de-sac. It was necessary to proceed with endovaginal exam following the transabdominal exam to visualize the uterus, endometrium, ovaries and adnexa. COMPARISON:  None FINDINGS: Uterus Measurements: 8.9 x 4.1 x 6.4 cm = volume: 121.4 mL. No fibroids or other mass visualized. Endometrium Thickness: 6 mm in thickness.  No focal abnormality visualized. Right ovary Measurements: 3.4 x 2.1 x 3.0 cm = volume: 11.2 mL. Normal appearance/no adnexal mass. Left ovary Measurements: 3.1 x 1.9 x 2.4 cm = volume: 7.2 mL. Normal appearance/no adnexal mass. Other findings No abnormal free fluid. IMPRESSION: Unremarkable pelvic ultrasound. Electronically Signed   By: Charlett Nose M.D.   On: 07/09/2018 12:49    Assessment and Plan  Pelvic pain   - Information provided on pelvic pain - Rx for Ibuprofen 800 mg TID - Advised to take ibuprofen around the clock for next 48 hrs   -  Discharge patient - Keep scheduled appt with Dr. Macon Large on 08/01/18 - Patient verbalized an understanding of the plan of care and agrees.    Raelyn Mora, MSN, CNM 07/09/2018, 10:47 AM

## 2018-07-09 NOTE — Discharge Instructions (Signed)
On September 02, 2018, [facility]. At that time, the MAU (Maternity Admissions Unit), where you are being seen today, will no longer take care of non-pregnant patients. We strongly encourage you to find a doctor's office before that time, so that you can be seen with any GYN concerns, like vaginal discharge, urinary tract infection, etc.. in a timely manner.  In order to make an office visit more convenient, the Center for [facility] at [facility] will be offering evening hours with same-day appointments, walk-in appointments and scheduled appointments available during this time.  Center for [facility] @ [facility] Hours: Monday - 8am - 7:30 pm with walk-in between 4pm- 7:30 pm Tuesday - 8 am - 5 pm open late and accepting walk-ins from 4pm - 7:30 pm Wednesday - 8 am - 5 pm open late and accepting walk-ins from 4pm - 7:30 pm Thursday 8 am - 5 pmopen late and accepting walk-ins from 4pm - 7:30 pm Friday 8 am - 12 pm  For an appointment please call the Center for [facility] @ [facility] at [phone]  For urgent needs, [facility] is also available for management of urgent GYN complaints such as vaginal discharge or urinary tract infections.

## 2018-07-10 ENCOUNTER — Telehealth: Payer: Self-pay | Admitting: *Deleted

## 2018-07-10 LAB — HIV ANTIBODY (ROUTINE TESTING W REFLEX): HIV Screen 4th Generation wRfx: NONREACTIVE

## 2018-07-10 NOTE — Telephone Encounter (Signed)
Pt left VM message stating that she has office appt scheduled on 08/01/18. She is having pain and wants to know if she can get earlier appt. Per chart review, pt was seen yesterday @ MAU and evaluated for pain. She was advised to take ibuprofen 800 mg TID x 48 hrs.

## 2018-07-13 LAB — GC/CHLAMYDIA PROBE AMP (~~LOC~~) NOT AT ARMC
Chlamydia: NEGATIVE
NEISSERIA GONORRHEA: NEGATIVE

## 2018-07-19 ENCOUNTER — Encounter: Payer: Self-pay | Admitting: Obstetrics & Gynecology

## 2018-07-19 ENCOUNTER — Telehealth: Payer: Self-pay | Admitting: General Practice

## 2018-07-19 ENCOUNTER — Ambulatory Visit (INDEPENDENT_AMBULATORY_CARE_PROVIDER_SITE_OTHER): Payer: 59 | Admitting: Obstetrics & Gynecology

## 2018-07-19 VITALS — BP 106/66 | HR 65 | Wt 136.7 lb

## 2018-07-19 DIAGNOSIS — N912 Amenorrhea, unspecified: Secondary | ICD-10-CM

## 2018-07-19 DIAGNOSIS — R102 Pelvic and perineal pain: Secondary | ICD-10-CM

## 2018-07-19 DIAGNOSIS — F439 Reaction to severe stress, unspecified: Secondary | ICD-10-CM | POA: Diagnosis not present

## 2018-07-19 NOTE — Progress Notes (Signed)
Ref[facility].

## 2018-07-19 NOTE — Progress Notes (Signed)
Patient ID: Nicole Gallegos, female   DOB: 1986-02-14, 33 y.o.   MRN: [id number]  Chief Complaint  Patient presents with  . Abdominal Pain    HPI Nicole Gallegos is a 33 y.o. married P4 (12, 88, 5, and 2 yo kids) here today with a 2 year h/o pelvic pain, but worse in the last 3 months. IBU 800mg  and heating pads don't work. She also doesn't like taking it.  She feels like this all started after the BTL (Filsche clips). The pain radiates to her lower back and down her thighs. She has not had a period since 10/19.  She is conflicted because a part of her wants another kid.She has done some research on this subject.   Past Medical History:  Diagnosis Date  . Asthma    Exercise induced;inhaler prn  . Constipation   . Depression    No meds. currently  . Diabetes type 2, uncontrolled (HCC)   . History of blood transfusion    Accident at 7 yoa and lost a lot of blood  . Migraine   . Panic attacks    No meds currently;used to take meds in 2009  . Preterm labor 2010   2nd child;had to stop labor 2 times;due to stress  . Scoliosis   . Seizures (HCC)    if gets overheated;no meds, last seizure 6 yrs ago    Past Surgical History:  Procedure Laterality Date  . DILATION AND CURETTAGE OF UTERUS     Suction D&C to remove 16 wk. fetal demise  . FOOT SURGERY     R foot, repair torn tissue and skin graft  . TUBAL LIGATION Bilateral 07/02/2016   Procedure: POST PARTUM TUBAL LIGATION with filshie clips;  Surgeon: Tereso Newcomer, MD;  Location: [facility];  Service: Gynecology;  Laterality: Bilateral;  . WISDOM TOOTH EXTRACTION  2008    Family History  Problem Relation Age of Onset  . Seizures Father        if gets overheated  . Heart disease Maternal Aunt        Hole in the heart  . Heart disease Maternal Uncle        Hole in the heart  . Heart attack Maternal Grandmother        Deceased  . Hypertension Maternal Grandmother   . Hypertension Maternal Uncle   . Hypertension Paternal Uncle   .  Diabetes Paternal Grandmother   . Seizures Daughter        if gets overheated  . Seizures Daughter        if gets overheated  . Bipolar disorder Cousin        maternal  . Depression Cousin        maternal  . Other Daughter        1st child had 2 vein umbilical cord; Pt did weekly NSTs.  . Anesthesia problems Neg Hx     Social History Social History   Tobacco Use  . Smoking status: Never Smoker  . Smokeless tobacco: Never Used  Substance Use Topics  . Alcohol use: Yes    Comment: occasionally  . Drug use: No    Allergies  Allergen Reactions  . Chocolate Hives    Current Outpatient Medications  Medication Sig Dispense Refill  . albuterol (PROVENTIL HFA;VENTOLIN HFA) 108 (90 Base) MCG/ACT inhaler Inhale 2 puffs into the lungs every 6 (six) hours as needed for wheezing or shortness of breath.    Marland Kitchen ibuprofen (ADVIL,MOTRIN) 800  MG tablet Take 1 tablet (800 mg total) by mouth 3 (three) times daily. (Patient not taking: Reported on 07/19/2018) 21 tablet 0  . lisinopril-hydrochlorothiazide (PRINZIDE,ZESTORETIC) 10-12.5 MG tablet Take 1 tablet by mouth daily. (Patient not taking: Reported on 08/09/2016) 30 tablet 3  . predniSONE (DELTASONE) 10 MG tablet Take 2 tablets (20 mg total) by mouth 2 (two) times daily with a meal. (Patient not taking: Reported on 09/25/2017) 16 tablet 0   No current facility-administered medications for this visit.     Review of Systems Review of Systems [employer], EKGs and stress tests  Blood pressure 106/66, pulse 65, weight 136 lb 11.2 oz (62 kg).  Physical Exam Physical Exam Well nourished, well hydrated Black female, no apparent distress Breathing, conversing, and ambulating normally Abd- benign  Data Reviewed U/s normal last month  Assessment  pelvic pain sounding like endometriosis Amenorrhea for 3 months  stress    Plan    Check tsh, fsh appt with [facility] at her convenience Referral  to Dr. April Manson.       Allie Bossier 07/19/2018, 8:37 AM

## 2018-07-19 NOTE — Telephone Encounter (Signed)
Patient called and left message on nurse voicemail line calling about her referral and records to be sent to [facility]. Called patient, no answer- left message stating we are trying to reach you to return your phone, please check your mychart account for more information.

## 2018-07-20 LAB — TSH: TSH: 1.03 u[IU]/mL (ref 0.450–4.500)

## 2018-07-20 LAB — FOLLICLE STIMULATING HORMONE: FSH: 8.6 m[IU]/mL

## 2018-07-23 ENCOUNTER — Telehealth: Payer: Self-pay | Admitting: Family Medicine

## 2018-07-23 NOTE — Telephone Encounter (Signed)
Pt called to check the status of the release of records. Informed to the pt they were sent. Our clinic re-sent the records again today to [facility].

## 2018-08-01 ENCOUNTER — Ambulatory Visit: Payer: Self-pay | Admitting: Obstetrics & Gynecology

## 2018-08-02 DIAGNOSIS — N971 Female infertility of tubal origin: Secondary | ICD-10-CM | POA: Diagnosis not present

## 2018-08-02 DIAGNOSIS — Z319 Encounter for procreative management, unspecified: Secondary | ICD-10-CM | POA: Diagnosis not present

## 2018-08-02 DIAGNOSIS — N912 Amenorrhea, unspecified: Secondary | ICD-10-CM | POA: Diagnosis not present

## 2018-08-02 DIAGNOSIS — R102 Pelvic and perineal pain: Secondary | ICD-10-CM | POA: Diagnosis not present

## 2018-09-04 ENCOUNTER — Telehealth: Payer: Self-pay

## 2018-09-04 NOTE — Telephone Encounter (Signed)
Pt called stating that she has a question about her pregnancy test.

## 2018-09-04 NOTE — Telephone Encounter (Signed)
Called patient to concerning her question about pregnancy test. According to her records she has not had a recent pregnancy test.

## 2018-09-14 DIAGNOSIS — N949 Unspecified condition associated with female genital organs and menstrual cycle: Secondary | ICD-10-CM | POA: Diagnosis not present

## 2018-09-14 DIAGNOSIS — R102 Pelvic and perineal pain: Secondary | ICD-10-CM | POA: Diagnosis not present

## 2018-09-27 ENCOUNTER — Telehealth: Payer: Self-pay | Admitting: Obstetrics & Gynecology

## 2018-09-27 ENCOUNTER — Other Ambulatory Visit: Payer: Self-pay

## 2018-09-27 ENCOUNTER — Inpatient Hospital Stay (HOSPITAL_COMMUNITY)
Admission: AD | Admit: 2018-09-27 | Discharge: 2018-09-27 | Disposition: A | Discharge status: Home / Self Care | Payer: 59 | Attending: Obstetrics and Gynecology | Admitting: Obstetrics and Gynecology

## 2018-09-27 DIAGNOSIS — Z3202 Encounter for pregnancy test, result negative: Secondary | ICD-10-CM | POA: Diagnosis not present

## 2018-09-27 DIAGNOSIS — N809 Endometriosis, unspecified: Secondary | ICD-10-CM | POA: Insufficient documentation

## 2018-09-27 DIAGNOSIS — Z9851 Tubal ligation status: Secondary | ICD-10-CM | POA: Insufficient documentation

## 2018-09-27 LAB — POCT PREGNANCY, URINE: Preg Test, Ur: NEGATIVE

## 2018-09-27 NOTE — MAU Note (Signed)
Pt told they were concerned about possible tubal pregnancy because something showed on her ultrasound. preg test neg at women's and at home. Having abdominal today. 8/10. No cycle last month.

## 2018-09-27 NOTE — Telephone Encounter (Signed)
She called because she saw the PA at Dr. Lyndal Rainbow office. She says that she has lost weight since she started on a medication that the other office started her on. I have recommended that she call their office.

## 2018-09-27 NOTE — MAU Provider Note (Signed)
None     S Ms. Nicole Gallegos is a 33 y.o. [phone] non-pregnant female who presents to MAU today with complaint of abdominal pain in the setting of previously diagnosed endometriosis. This is a recurring problem. She is s/p tubal ligation with Filshie Clips on 07/02/2016. She verbalizes concern for tubal pregnancy. Stated LMP of "summer 2019".  O BP 119/90 (BP Location: Right Arm)   Pulse 90   Resp 16   Wt 58.7 kg   LMP 08/07/2018   SpO2 100%   BMI 19.69 kg/m  Physical Exam  Nursing note and vitals reviewed. Constitutional: She is oriented to person, place, and time. She appears well-developed and well-nourished.  Cardiovascular: Normal rate.  Respiratory: Effort normal.  GI: Soft. There is no abdominal tenderness. There is no rebound.  Neurological: She is alert and oriented to person, place, and time.  Skin: Skin is warm and dry.  Psychiatric: She has a normal mood and affect. Her behavior is normal. Judgment and thought content normal.    A Non pregnant female Amenorrheic Medical screening exam complete   P Discharge from MAU in stable condition Patient given the option of transfer to [facility] for further evaluation or seek care in outpatient facility of choice List of options for follow-up given  Warning signs for worsening condition that would warrant emergency follow-up discussed   Briant Sites 09/27/2018 9:44 AM

## 2018-09-27 NOTE — Discharge Instructions (Signed)
Endometriosis  Endometriosis is a condition in which the tissue that lines the uterus (endometrium) grows outside of its normal location. The tissue may grow in many locations close to the uterus, but it commonly grows on the ovaries, fallopian tubes, vagina, or bowel. When the uterus sheds the endometrium every menstrual cycle, there is bleeding wherever the endometrial tissue is located. This can cause pain because blood is irritating to tissues that are not normally exposed to it. What are the causes? The cause of endometriosis is not known. What increases the risk? You may be more likely to develop endometriosis if you:  Have a family history of endometriosis.  Have never given birth.  Started your period at age 10 or younger.  Have high levels of estrogen in your body.  Were exposed to a certain medicine (diethylstilbestrol) before you were born (in utero).  Had low birth weight.  Were born as a twin, triplet, or other multiple.  Have a BMI of less than 25. BMI is an estimate of body fat and is calculated from height and weight. What are the signs or symptoms? Often, there are no symptoms of this condition. If you do have symptoms, they may:  Vary depending on where your endometrial tissue is growing.  Occur during your menstrual period (most common) or midcycle.  Come and go, or you may go months with no symptoms at all.  Stop with menopause. Symptoms may include:  Pain in the back or abdomen.  Heavier bleeding during periods.  Pain during sex.  Painful bowel movements.  Infertility.  Pelvic pain.  Bleeding more than once a month. How is this diagnosed? This condition is diagnosed based on your symptoms and a physical exam. You may have tests, such as:  Blood tests and urine tests. These may be done to help rule out other possible causes of your symptoms.  Ultrasound, to look for abnormal tissues.  An X-ray of the lower bowel (barium enema).  An  ultrasound that is done through the vagina (transvaginally).  CT scan.  MRI.  Laparoscopy. In this procedure, a lighted, pencil-sized instrument called a laparoscope is inserted into your abdomen through an incision. The laparoscope allows your health care provider to look at the organs inside your body and check for abnormal tissue to confirm the diagnosis. If abnormal tissue is found, your health care provider may remove a small piece of tissue (biopsy) to be examined under a microscope. How is this treated? Treatment for this condition may include:  Medicines to relieve pain, such as NSAIDs.  Hormone therapy. This involves using artificial (synthetic) hormones to reduce endometrial tissue growth. Your health care provider may recommend using a hormonal form of birth control, or other medicines.  Surgery. This may be done to remove abnormal endometrial tissue. ? In some cases, tissue may be removed using a laparoscope and a laser (laparoscopic laser treatment). ? In severe cases, surgery may be done to remove the fallopian tubes, uterus, and ovaries (hysterectomy). Follow these instructions at home:  Take over-the-counter and prescription medicines only as told by your health care provider.  Do not drive or use heavy machinery while taking prescription pain medicine.  Try to avoid activities that cause pain, including sexual activity.  Keep all follow-up visits as told by your health care provider. This is important. Contact a health care provider if:  You have pain in the area between your hip bones (pelvic area) that occurs: ? Before, during, or after your period. ?   In between your period and gets worse during your period. ? During or after sex. ? With bowel movements or urination, especially during your period.  You have problems getting pregnant.  You have a fever. Get help right away if:  You have severe pain that does not get better with medicine.  You have severe  nausea and vomiting, or you cannot eat without vomiting.  You have pain that affects only the lower, right side of your abdomen.  You have abdominal pain that gets worse.  You have abdominal swelling.  You have blood in your stool. This information is not intended to replace advice given to you by your health care provider. Make sure you discuss any questions you have with your health care provider. Document Released: 06/24/2000 Document Revised: 04/01/2016 Document Reviewed: 11/28/2015 Elsevier Interactive Patient Education  2019 Elsevier Inc.   

## 2018-09-27 NOTE — Telephone Encounter (Signed)
The patient called in regards to questions about medication.

## 2018-10-02 DIAGNOSIS — E559 Vitamin D deficiency, unspecified: Secondary | ICD-10-CM | POA: Diagnosis not present

## 2018-10-02 DIAGNOSIS — Z7689 Persons encountering health services in other specified circumstances: Secondary | ICD-10-CM | POA: Diagnosis not present

## 2018-10-02 DIAGNOSIS — R7303 Prediabetes: Secondary | ICD-10-CM | POA: Diagnosis not present

## 2018-10-02 DIAGNOSIS — F439 Reaction to severe stress, unspecified: Secondary | ICD-10-CM | POA: Diagnosis not present

## 2018-10-02 DIAGNOSIS — J028 Acute pharyngitis due to other specified organisms: Secondary | ICD-10-CM | POA: Diagnosis not present

## 2018-10-02 DIAGNOSIS — J45909 Unspecified asthma, uncomplicated: Secondary | ICD-10-CM | POA: Diagnosis not present

## 2018-12-07 DIAGNOSIS — J45909 Unspecified asthma, uncomplicated: Secondary | ICD-10-CM | POA: Diagnosis not present

## 2018-12-07 DIAGNOSIS — E559 Vitamin D deficiency, unspecified: Secondary | ICD-10-CM | POA: Diagnosis not present

## 2018-12-07 DIAGNOSIS — F439 Reaction to severe stress, unspecified: Secondary | ICD-10-CM | POA: Diagnosis not present

## 2018-12-07 DIAGNOSIS — R7303 Prediabetes: Secondary | ICD-10-CM | POA: Diagnosis not present

## 2018-12-07 DIAGNOSIS — N946 Dysmenorrhea, unspecified: Secondary | ICD-10-CM | POA: Diagnosis not present

## 2019-02-01 DIAGNOSIS — Z113 Encounter for screening for infections with a predominantly sexual mode of transmission: Secondary | ICD-10-CM | POA: Diagnosis not present

## 2019-02-01 DIAGNOSIS — Z131 Encounter for screening for diabetes mellitus: Secondary | ICD-10-CM | POA: Diagnosis not present

## 2019-02-01 DIAGNOSIS — Z0000 Encounter for general adult medical examination without abnormal findings: Secondary | ICD-10-CM | POA: Diagnosis not present

## 2019-02-01 DIAGNOSIS — Z0100 Encounter for examination of eyes and vision without abnormal findings: Secondary | ICD-10-CM | POA: Diagnosis not present

## 2019-04-08 ENCOUNTER — Ambulatory Visit: Payer: 59

## 2019-04-09 DIAGNOSIS — N945 Secondary dysmenorrhea: Secondary | ICD-10-CM | POA: Diagnosis not present

## 2019-04-09 DIAGNOSIS — J45909 Unspecified asthma, uncomplicated: Secondary | ICD-10-CM | POA: Diagnosis not present

## 2019-04-09 DIAGNOSIS — Z113 Encounter for screening for infections with a predominantly sexual mode of transmission: Secondary | ICD-10-CM | POA: Diagnosis not present

## 2019-04-09 DIAGNOSIS — E559 Vitamin D deficiency, unspecified: Secondary | ICD-10-CM | POA: Diagnosis not present

## 2019-04-09 DIAGNOSIS — N946 Dysmenorrhea, unspecified: Secondary | ICD-10-CM | POA: Diagnosis not present

## 2019-05-06 DIAGNOSIS — N946 Dysmenorrhea, unspecified: Secondary | ICD-10-CM | POA: Diagnosis not present

## 2019-05-06 DIAGNOSIS — J45909 Unspecified asthma, uncomplicated: Secondary | ICD-10-CM | POA: Diagnosis not present

## 2019-05-06 DIAGNOSIS — R110 Nausea: Secondary | ICD-10-CM | POA: Diagnosis not present

## 2019-05-06 DIAGNOSIS — E559 Vitamin D deficiency, unspecified: Secondary | ICD-10-CM | POA: Diagnosis not present

## 2019-05-15 ENCOUNTER — Telehealth: Payer: Self-pay | Admitting: Obstetrics & Gynecology

## 2019-05-15 ENCOUNTER — Telehealth (INDEPENDENT_AMBULATORY_CARE_PROVIDER_SITE_OTHER): Payer: 59 | Admitting: Family Medicine

## 2019-05-15 DIAGNOSIS — R1084 Generalized abdominal pain: Secondary | ICD-10-CM

## 2019-05-15 NOTE — Telephone Encounter (Signed)
Received a call from Mrs. Owens Shark. She stated she was having more pain, and would like to come in sooner. Dr. Hulan Fray has a full schedule, and I have suggested to Mrs. Owens Shark to be place on the wait list. If there is a cancellation we will call her to come in sooner at the number listed in [place].

## 2019-05-15 NOTE — Telephone Encounter (Signed)
The patient called in stating she is unsure if a cyst ruptured yesterday but she is uncomfortable and in a lot of pain. She stated she does not have any more pain pills, in addition she needs a note for her surgeon from the gyn stating she is uncomfortable. She would like a call back to discuss the circumstance in detail. She has an appointment in two weeks with Dr. Hulan Fray.

## 2019-05-17 NOTE — Telephone Encounter (Signed)
Called pt and she reports she went to her PCP and was told she may have ulcers. Pt reports she saw a specialist that reports she has Endometriosis. Pt want to have Tubal Ligation reversed. She has continued to have pain. Pt has an appt coming up on 11/18.   Pt will need a written statement about getting her tubal reversed. Discussed she will need to see provider first. Pt is asking for earlier appt with Dr. Hulan Fray. Informed her I will ask front desk but not likely that we have an earlier appt.

## 2019-05-29 ENCOUNTER — Encounter: Payer: Self-pay | Admitting: Obstetrics & Gynecology

## 2019-05-29 ENCOUNTER — Other Ambulatory Visit: Payer: Self-pay

## 2019-05-29 ENCOUNTER — Ambulatory Visit: Payer: 59 | Admitting: Obstetrics & Gynecology

## 2019-05-29 NOTE — Progress Notes (Signed)
   Subjective:    Patient ID: Nicole Gallegos, female    DOB: 09/08/1985, 33 y.o.   MRN: [id number]  HPI 33 yo married P4 here with the issue of pelvic pain. I saw her in 1/20 and said that her pain sounds like endometriosis. I referred her to Dr Kerin Perna and she was seen there in spring. She had an u/s done there. She was treated at some point with OCPs that did not help the pain.  She wants a tubal reversal.  Review of Systems     Objective:   Physical Exam Breathing, conversing, and ambulating normally Well nourished, well hydrated Black female, no apparent distress     Assessment & Plan:  She is not sure why she is here today.

## 2019-05-29 NOTE — Progress Notes (Signed)
   Patient reported "having cherry cyst on uterus at least 4.  Per patient last cyst ruptured about 2 weeks. Had BTL completed on 07/03/2016, developed endometriosis diagnosed by Dr. Manus Rudd. Pt complains of a lot pelvic pain. Pain feels sharp from abdomen to back. Started having menstrual cycle 01/2019 since diagnosis.   Derl Barrow, RN

## 2019-06-17 DIAGNOSIS — E559 Vitamin D deficiency, unspecified: Secondary | ICD-10-CM | POA: Diagnosis not present

## 2019-06-17 DIAGNOSIS — J45909 Unspecified asthma, uncomplicated: Secondary | ICD-10-CM | POA: Diagnosis not present

## 2019-06-17 DIAGNOSIS — R1013 Epigastric pain: Secondary | ICD-10-CM | POA: Diagnosis not present

## 2019-06-17 DIAGNOSIS — N946 Dysmenorrhea, unspecified: Secondary | ICD-10-CM | POA: Diagnosis not present

## 2019-07-25 DIAGNOSIS — E559 Vitamin D deficiency, unspecified: Secondary | ICD-10-CM | POA: Diagnosis not present

## 2019-07-25 DIAGNOSIS — R1013 Epigastric pain: Secondary | ICD-10-CM | POA: Diagnosis not present

## 2019-07-25 DIAGNOSIS — J45909 Unspecified asthma, uncomplicated: Secondary | ICD-10-CM | POA: Diagnosis not present

## 2019-07-25 DIAGNOSIS — N946 Dysmenorrhea, unspecified: Secondary | ICD-10-CM | POA: Diagnosis not present

## 2019-07-25 DIAGNOSIS — M25562 Pain in left knee: Secondary | ICD-10-CM | POA: Diagnosis not present

## 2019-10-16 DIAGNOSIS — J45909 Unspecified asthma, uncomplicated: Secondary | ICD-10-CM | POA: Diagnosis not present

## 2019-10-16 DIAGNOSIS — R1013 Epigastric pain: Secondary | ICD-10-CM | POA: Diagnosis not present

## 2019-10-16 DIAGNOSIS — E559 Vitamin D deficiency, unspecified: Secondary | ICD-10-CM | POA: Diagnosis not present

## 2019-10-16 DIAGNOSIS — N946 Dysmenorrhea, unspecified: Secondary | ICD-10-CM | POA: Diagnosis not present

## 2019-10-22 DIAGNOSIS — R1013 Epigastric pain: Secondary | ICD-10-CM | POA: Diagnosis not present

## 2019-10-22 DIAGNOSIS — Z0000 Encounter for general adult medical examination without abnormal findings: Secondary | ICD-10-CM | POA: Diagnosis not present

## 2019-10-22 DIAGNOSIS — Z131 Encounter for screening for diabetes mellitus: Secondary | ICD-10-CM | POA: Diagnosis not present

## 2019-10-22 DIAGNOSIS — Z0110 Encounter for examination of ears and hearing without abnormal findings: Secondary | ICD-10-CM | POA: Diagnosis not present

## 2019-10-22 DIAGNOSIS — J45909 Unspecified asthma, uncomplicated: Secondary | ICD-10-CM | POA: Diagnosis not present

## 2019-10-22 DIAGNOSIS — N946 Dysmenorrhea, unspecified: Secondary | ICD-10-CM | POA: Diagnosis not present

## 2019-10-22 DIAGNOSIS — Z136 Encounter for screening for cardiovascular disorders: Secondary | ICD-10-CM | POA: Diagnosis not present

## 2019-10-22 DIAGNOSIS — E559 Vitamin D deficiency, unspecified: Secondary | ICD-10-CM | POA: Diagnosis not present

## 2019-10-28 DIAGNOSIS — R1013 Epigastric pain: Secondary | ICD-10-CM | POA: Diagnosis not present

## 2019-10-28 DIAGNOSIS — Z124 Encounter for screening for malignant neoplasm of cervix: Secondary | ICD-10-CM | POA: Diagnosis not present

## 2019-10-28 DIAGNOSIS — J45909 Unspecified asthma, uncomplicated: Secondary | ICD-10-CM | POA: Diagnosis not present

## 2019-10-28 DIAGNOSIS — F419 Anxiety disorder, unspecified: Secondary | ICD-10-CM | POA: Diagnosis not present

## 2019-10-28 DIAGNOSIS — E559 Vitamin D deficiency, unspecified: Secondary | ICD-10-CM | POA: Diagnosis not present

## 2019-10-28 DIAGNOSIS — N946 Dysmenorrhea, unspecified: Secondary | ICD-10-CM | POA: Diagnosis not present

## 2020-01-14 DIAGNOSIS — N949 Unspecified condition associated with female genital organs and menstrual cycle: Secondary | ICD-10-CM | POA: Diagnosis not present

## 2020-01-14 DIAGNOSIS — R102 Pelvic and perineal pain: Secondary | ICD-10-CM | POA: Diagnosis not present

## 2020-02-19 DIAGNOSIS — R1013 Epigastric pain: Secondary | ICD-10-CM | POA: Diagnosis not present

## 2020-02-19 DIAGNOSIS — N946 Dysmenorrhea, unspecified: Secondary | ICD-10-CM | POA: Diagnosis not present

## 2020-02-19 DIAGNOSIS — E559 Vitamin D deficiency, unspecified: Secondary | ICD-10-CM | POA: Diagnosis not present

## 2020-02-19 DIAGNOSIS — F419 Anxiety disorder, unspecified: Secondary | ICD-10-CM | POA: Diagnosis not present

## 2020-02-19 DIAGNOSIS — J45909 Unspecified asthma, uncomplicated: Secondary | ICD-10-CM | POA: Diagnosis not present

## 2020-02-27 DIAGNOSIS — Z131 Encounter for screening for diabetes mellitus: Secondary | ICD-10-CM | POA: Diagnosis not present

## 2020-02-27 DIAGNOSIS — R1013 Epigastric pain: Secondary | ICD-10-CM | POA: Diagnosis not present

## 2020-02-27 DIAGNOSIS — F419 Anxiety disorder, unspecified: Secondary | ICD-10-CM | POA: Diagnosis not present

## 2020-02-27 DIAGNOSIS — J45909 Unspecified asthma, uncomplicated: Secondary | ICD-10-CM | POA: Diagnosis not present

## 2020-02-27 DIAGNOSIS — E559 Vitamin D deficiency, unspecified: Secondary | ICD-10-CM | POA: Diagnosis not present

## 2020-02-27 DIAGNOSIS — N946 Dysmenorrhea, unspecified: Secondary | ICD-10-CM | POA: Diagnosis not present

## 2020-02-27 DIAGNOSIS — Z0000 Encounter for general adult medical examination without abnormal findings: Secondary | ICD-10-CM | POA: Diagnosis not present

## 2020-03-27 ENCOUNTER — Ambulatory Visit (HOSPITAL_BASED_OUTPATIENT_CLINIC_OR_DEPARTMENT_OTHER): Admission: RE | Admit: 2020-03-27 | Payer: 59 | Source: Home / Self Care | Admitting: Obstetrics and Gynecology

## 2020-03-27 ENCOUNTER — Encounter (HOSPITAL_BASED_OUTPATIENT_CLINIC_OR_DEPARTMENT_OTHER): Admission: RE | Payer: Self-pay | Source: Home / Self Care

## 2020-03-27 SURGERY — REANASTOMOSIS, FALLOPIAN TUBE, LAPAROSCOPIC
Anesthesia: General

## 2020-08-05 DIAGNOSIS — E559 Vitamin D deficiency, unspecified: Secondary | ICD-10-CM | POA: Diagnosis not present

## 2020-08-05 DIAGNOSIS — M542 Cervicalgia: Secondary | ICD-10-CM | POA: Diagnosis not present

## 2020-08-05 DIAGNOSIS — R1013 Epigastric pain: Secondary | ICD-10-CM | POA: Diagnosis not present

## 2020-08-05 DIAGNOSIS — N946 Dysmenorrhea, unspecified: Secondary | ICD-10-CM | POA: Diagnosis not present

## 2020-08-05 DIAGNOSIS — F419 Anxiety disorder, unspecified: Secondary | ICD-10-CM | POA: Diagnosis not present

## 2020-08-05 DIAGNOSIS — E782 Mixed hyperlipidemia: Secondary | ICD-10-CM | POA: Diagnosis not present

## 2020-08-05 DIAGNOSIS — J45909 Unspecified asthma, uncomplicated: Secondary | ICD-10-CM | POA: Diagnosis not present

## 2020-09-04 DIAGNOSIS — Z130 Encounter for screening for diseases of the blood and blood-forming organs and certain disorders involving the immune mechanism: Secondary | ICD-10-CM | POA: Diagnosis not present

## 2020-09-07 ENCOUNTER — Other Ambulatory Visit (HOSPITAL_COMMUNITY): Payer: Self-pay | Admitting: Physician Assistant

## 2020-09-07 DIAGNOSIS — E782 Mixed hyperlipidemia: Secondary | ICD-10-CM | POA: Diagnosis not present

## 2020-09-07 DIAGNOSIS — N946 Dysmenorrhea, unspecified: Secondary | ICD-10-CM | POA: Diagnosis not present

## 2020-09-07 DIAGNOSIS — J45909 Unspecified asthma, uncomplicated: Secondary | ICD-10-CM | POA: Diagnosis not present

## 2020-09-07 DIAGNOSIS — Z136 Encounter for screening for cardiovascular disorders: Secondary | ICD-10-CM | POA: Diagnosis not present

## 2020-09-07 DIAGNOSIS — F419 Anxiety disorder, unspecified: Secondary | ICD-10-CM | POA: Diagnosis not present

## 2020-09-07 DIAGNOSIS — E559 Vitamin D deficiency, unspecified: Secondary | ICD-10-CM | POA: Diagnosis not present

## 2020-09-07 DIAGNOSIS — Z0000 Encounter for general adult medical examination without abnormal findings: Secondary | ICD-10-CM | POA: Diagnosis not present

## 2020-09-07 DIAGNOSIS — Z131 Encounter for screening for diabetes mellitus: Secondary | ICD-10-CM | POA: Diagnosis not present

## 2020-09-07 DIAGNOSIS — R1013 Epigastric pain: Secondary | ICD-10-CM | POA: Diagnosis not present

## 2020-09-07 MED FILL — ALBUTEROL SULFATE HFA 108 (: 108 (90 BAS | 17 days supply | Qty: 9 | Fill #0

## 2020-09-08 HISTORY — PX: OTHER SURGICAL HISTORY: SHX169

## 2020-10-13 DIAGNOSIS — J45909 Unspecified asthma, uncomplicated: Secondary | ICD-10-CM | POA: Diagnosis not present

## 2020-10-13 DIAGNOSIS — E559 Vitamin D deficiency, unspecified: Secondary | ICD-10-CM | POA: Diagnosis not present

## 2020-10-13 DIAGNOSIS — R1013 Epigastric pain: Secondary | ICD-10-CM | POA: Diagnosis not present

## 2020-10-13 DIAGNOSIS — N946 Dysmenorrhea, unspecified: Secondary | ICD-10-CM | POA: Diagnosis not present

## 2020-10-13 DIAGNOSIS — L270 Generalized skin eruption due to drugs and medicaments taken internally: Secondary | ICD-10-CM | POA: Diagnosis not present

## 2020-10-13 DIAGNOSIS — F419 Anxiety disorder, unspecified: Secondary | ICD-10-CM | POA: Diagnosis not present

## 2020-10-27 ENCOUNTER — Other Ambulatory Visit (HOSPITAL_COMMUNITY): Payer: Self-pay

## 2020-10-27 DIAGNOSIS — J45909 Unspecified asthma, uncomplicated: Secondary | ICD-10-CM | POA: Diagnosis not present

## 2020-10-27 DIAGNOSIS — E559 Vitamin D deficiency, unspecified: Secondary | ICD-10-CM | POA: Diagnosis not present

## 2020-10-27 DIAGNOSIS — N946 Dysmenorrhea, unspecified: Secondary | ICD-10-CM | POA: Diagnosis not present

## 2020-10-27 DIAGNOSIS — F419 Anxiety disorder, unspecified: Secondary | ICD-10-CM | POA: Diagnosis not present

## 2020-10-27 DIAGNOSIS — L270 Generalized skin eruption due to drugs and medicaments taken internally: Secondary | ICD-10-CM | POA: Diagnosis not present

## 2020-10-27 DIAGNOSIS — R1013 Epigastric pain: Secondary | ICD-10-CM | POA: Diagnosis not present

## 2020-10-27 MED ORDER — HYDROXYZINE HCL 25 MG PO TABS
25.0000 mg | ORAL_TABLET | Freq: Every day | ORAL | 0 refills | Status: DC
  Filled 2020-10-27: qty 30, 30d supply, fill #0

## 2020-10-27 MED ORDER — TRIAMCINOLONE ACETONIDE 0.1 % EX LOTN
1.0000 "application " | TOPICAL_LOTION | Freq: Three times a day (TID) | CUTANEOUS | 1 refills | Status: DC
  Filled 2020-10-27: qty 60, 10d supply, fill #0
  Filled 2020-11-04: qty 60, 14d supply, fill #1
  Filled 2020-11-05: qty 60, 10d supply, fill #1

## 2020-11-05 ENCOUNTER — Other Ambulatory Visit (HOSPITAL_COMMUNITY): Payer: Self-pay

## 2020-11-09 ENCOUNTER — Other Ambulatory Visit (HOSPITAL_COMMUNITY): Payer: Self-pay

## 2020-12-02 ENCOUNTER — Ambulatory Visit: Payer: 59 | Admitting: Obstetrics & Gynecology

## 2020-12-02 ENCOUNTER — Encounter: Payer: Self-pay | Admitting: Obstetrics & Gynecology

## 2020-12-02 ENCOUNTER — Other Ambulatory Visit: Payer: Self-pay

## 2020-12-02 VITALS — BP 101/71 | HR 69 | Ht 68.0 in | Wt 137.0 lb

## 2020-12-02 DIAGNOSIS — Z3169 Encounter for other general counseling and advice on procreation: Secondary | ICD-10-CM | POA: Diagnosis not present

## 2020-12-02 DIAGNOSIS — Z3142 Aftercare following sterilization reversal: Secondary | ICD-10-CM | POA: Diagnosis not present

## 2020-12-02 NOTE — Progress Notes (Signed)
GYNECOLOGY OFFICE VISIT NOTE  History:   Nicole Gallegos is a 35 y.o. [id number] here today for follow up after undergoing tubal reversal surgery in [place] in 09/2020.  No complications.  Wants to discuss when she should try conceiving. She denies any abnormal vaginal discharge, bleeding, pelvic pain or other concerns.    Past Medical History:  Diagnosis Date  . Asthma    Exercise induced;inhaler prn  . Constipation   . Depression    No meds. currently  . Diabetes type 2, uncontrolled (HCC)   . History of blood transfusion    Accident at 7 yoa and lost a lot of blood  . Migraine   . Panic attacks    No meds currently;used to take meds in 2009  . Preterm labor 2010   2nd child;had to stop labor 2 times;due to stress  . Scoliosis   . Seizures (HCC)    if gets overheated;no meds, last seizure 6 yrs ago    Past Surgical History:  Procedure Laterality Date  . DILATION AND CURETTAGE OF UTERUS     Suction D&C to remove 16 wk. fetal demise  . FOOT SURGERY     R foot, repair torn tissue and skin graft  . TUBAL LIGATION Bilateral 07/02/2016   Procedure: POST PARTUM TUBAL LIGATION with filshie clips;  Surgeon: Tereso Newcomer, MD;  Location: [facility];  Service: Gynecology;  Laterality: Bilateral;  . TUBAL REVERSAL  09/2020  . WISDOM TOOTH EXTRACTION  2008    The following portions of the patient's history were reviewed and updated as appropriate: allergies, current medications, past family history, past medical history, past social history, past surgical history and problem list.   Health Maintenance:  Normal pap done by her PCP in 2019 (will get records)   Review of Systems:  Pertinent items noted in HPI and remainder of comprehensive ROS otherwise negative.  Physical Exam:  BP 101/71   Pulse 69   Ht 5\' 8"  (1.727 m)   Wt 137 lb (62.1 kg)   LMP 12/01/2020 (Exact Date)   BMI 20.83 kg/m  CONSTITUTIONAL: Well-developed, well-nourished female in no acute distress.  HEENT:   Normocephalic, atraumatic. External right and left ear normal. No scleral icterus.  NECK: Normal range of motion, supple, no masses noted on observation SKIN: No rash noted. Not diaphoretic. No erythema. No pallor. MUSCULOSKELETAL: Normal range of motion. No edema noted. NEUROLOGIC: Alert and oriented to person, place, and time. Normal muscle tone coordination. No cranial nerve deficit noted. PSYCHIATRIC: Normal mood and affect. Normal behavior. Normal judgment and thought content. CARDIOVASCULAR: Normal heart rate noted RESPIRATORY: Effort and breath sounds normal, no problems with respiration noted ABDOMEN: No masses noted. No other overt distention noted.   PELVIC: Deferred     Assessment and Plan:      1. Tubal reversal surgical follow up 2. Encounter for preconception consultation Discussed concern about increased risk of ectopic pregnancy (10-15%) and need for early surveillance when she conceives. Once pregnancy is shown to be an IUP, routine prenatal care will be given.  If ectopic, it will be treated accordingly. She was told that she can start attempting conception now; it is okay to start within 2-3 cycles after tubal reversal. Will start taking prenatal vitamins with folic acid supplement, avoid teratogens, use ovulation tracker apps as needed.  Optimization of other health issues recommended; she has a noted history of T2DM and will follow up with PCP.  Routine preventative health maintenance measures emphasized.  Please refer to After Visit Summary for other counseling recommendations.   Return for any gynecologic concerns and when she conceives as early surveillance is needed.    I spent 20 minutes dedicated to the care of this patient including pre-visit review of records, face to face time with the patient discussing her conditions and conception plans.   Jaynie Collins, MD, FACOG Obstetrician & Gynecologist, [facility] for [employer], [facility]

## 2020-12-14 DIAGNOSIS — R609 Edema, unspecified: Secondary | ICD-10-CM | POA: Diagnosis not present

## 2020-12-31 ENCOUNTER — Ambulatory Visit (INDEPENDENT_AMBULATORY_CARE_PROVIDER_SITE_OTHER): Payer: 59

## 2020-12-31 ENCOUNTER — Other Ambulatory Visit: Payer: Self-pay

## 2020-12-31 VITALS — BP 114/80 | HR 91 | Wt 136.0 lb

## 2020-12-31 DIAGNOSIS — Z3201 Encounter for pregnancy test, result positive: Secondary | ICD-10-CM | POA: Diagnosis not present

## 2020-12-31 DIAGNOSIS — R110 Nausea: Secondary | ICD-10-CM

## 2020-12-31 LAB — POCT PREGNANCY, URINE: Preg Test, Ur: POSITIVE — AB

## 2020-12-31 MED ORDER — PROMETHAZINE HCL 25 MG PO TABS: 25.0000 mg | ORAL_TABLET | Freq: Four times a day (QID) | ORAL | 0 refills | Status: DC | PRN

## 2020-12-31 NOTE — Progress Notes (Signed)
Possible Pregnancy  Here today for pregnancy confirmation. UPT in office today is positive. Pt reports first positive home UPT on 12/27/20. Reviewed dating with patient:   LMP: 12/01/20 EDD: 09/07/21 4w 2d today  OB history reviewed. Reviewed medications and allergies with patient; list of medications safe to take during pregnancy given.  Pt reports nausea; Phenergan ordered per protocol. Pt reports abdominal cramping for past 3 weeks, denies any constant pain. Ectopic precautions given. Reports headaches that feel sharp for 5 minutes and then stop; hx of migraine, BP WNL today. Recommended pt begin prenatal vitamin and schedule prenatal care. Per chart review, Anyanwu, MD to be notified of confirmed pregnancy so that ectopic pregnancy can be ruled out. Chart routed to Anyanwu, MD for follow up recommendation. Front office notified to schedule new OB appts.  Marjo Bicker, RN 12/31/2020  4:38 PM

## 2021-01-01 ENCOUNTER — Telehealth: Payer: Self-pay

## 2021-01-01 ENCOUNTER — Inpatient Hospital Stay (HOSPITAL_COMMUNITY)
Admission: AD | Admit: 2021-01-01 | Discharge: 2021-01-01 | Disposition: A | Discharge status: Home / Self Care | Payer: 59 | Attending: Obstetrics & Gynecology | Admitting: Obstetrics & Gynecology

## 2021-01-01 ENCOUNTER — Other Ambulatory Visit: Payer: Self-pay

## 2021-01-01 ENCOUNTER — Encounter (HOSPITAL_COMMUNITY): Payer: Self-pay | Admitting: Obstetrics & Gynecology

## 2021-01-01 ENCOUNTER — Inpatient Hospital Stay (HOSPITAL_COMMUNITY): Payer: 59

## 2021-01-01 DIAGNOSIS — N760 Acute vaginitis: Secondary | ICD-10-CM | POA: Diagnosis not present

## 2021-01-01 DIAGNOSIS — O23591 Infection of other part of genital tract in pregnancy, first trimester: Secondary | ICD-10-CM | POA: Diagnosis not present

## 2021-01-01 DIAGNOSIS — B373 Candidiasis of vulva and vagina: Secondary | ICD-10-CM | POA: Diagnosis not present

## 2021-01-01 DIAGNOSIS — N888 Other specified noninflammatory disorders of cervix uteri: Secondary | ICD-10-CM | POA: Diagnosis not present

## 2021-01-01 DIAGNOSIS — B9689 Other specified bacterial agents as the cause of diseases classified elsewhere: Secondary | ICD-10-CM | POA: Diagnosis not present

## 2021-01-01 DIAGNOSIS — O24111 Pre-existing diabetes mellitus, type 2, in pregnancy, first trimester: Secondary | ICD-10-CM | POA: Insufficient documentation

## 2021-01-01 DIAGNOSIS — O26899 Other specified pregnancy related conditions, unspecified trimester: Secondary | ICD-10-CM

## 2021-01-01 DIAGNOSIS — O3680X0 Pregnancy with inconclusive fetal viability, not applicable or unspecified: Secondary | ICD-10-CM | POA: Insufficient documentation

## 2021-01-01 DIAGNOSIS — B3731 Acute candidiasis of vulva and vagina: Secondary | ICD-10-CM

## 2021-01-01 DIAGNOSIS — O98811 Other maternal infectious and parasitic diseases complicating pregnancy, first trimester: Secondary | ICD-10-CM

## 2021-01-01 DIAGNOSIS — R109 Unspecified abdominal pain: Secondary | ICD-10-CM | POA: Diagnosis not present

## 2021-01-01 DIAGNOSIS — O99891 Other specified diseases and conditions complicating pregnancy: Secondary | ICD-10-CM

## 2021-01-01 DIAGNOSIS — Z3A01 Less than 8 weeks gestation of pregnancy: Secondary | ICD-10-CM | POA: Diagnosis not present

## 2021-01-01 DIAGNOSIS — O26891 Other specified pregnancy related conditions, first trimester: Secondary | ICD-10-CM | POA: Diagnosis not present

## 2021-01-01 DIAGNOSIS — Z8759 Personal history of other complications of pregnancy, childbirth and the puerperium: Secondary | ICD-10-CM | POA: Diagnosis not present

## 2021-01-01 DIAGNOSIS — Z79899 Other long term (current) drug therapy: Secondary | ICD-10-CM | POA: Diagnosis not present

## 2021-01-01 LAB — URINALYSIS, ROUTINE W REFLEX MICROSCOPIC
Bilirubin Urine: NEGATIVE
Glucose, UA: NEGATIVE mg/dL
Hgb urine dipstick: NEGATIVE
Ketones, ur: NEGATIVE mg/dL
Nitrite: NEGATIVE
Protein, ur: NEGATIVE mg/dL
Specific Gravity, Urine: 1.027 (ref 1.005–1.030)
pH: 5 (ref 5.0–8.0)

## 2021-01-01 LAB — CBC
HCT: 44.1 % (ref 36.0–46.0)
Hemoglobin: 14.8 g/dL (ref 12.0–15.0)
MCH: 31.2 pg (ref 26.0–34.0)
MCHC: 33.6 g/dL (ref 30.0–36.0)
MCV: 93 fL (ref 80.0–100.0)
Platelets: 245 10*3/uL (ref 150–400)
RBC: 4.74 MIL/uL (ref 3.87–5.11)
RDW: 12.5 % (ref 11.5–15.5)
WBC: 3.6 10*3/uL — ABNORMAL LOW (ref 4.0–10.5)
nRBC: 0 % (ref 0.0–0.2)

## 2021-01-01 LAB — HIV ANTIBODY (ROUTINE TESTING W REFLEX): HIV Screen 4th Generation wRfx: NONREACTIVE

## 2021-01-01 LAB — HCG, QUANTITATIVE, PREGNANCY: hCG, Beta Chain, Quant, S: 517 m[IU]/mL — ABNORMAL HIGH (ref <5)

## 2021-01-01 LAB — WET PREP, GENITAL
Sperm: NONE SEEN
Trich, Wet Prep: NONE SEEN
Yeast Wet Prep HPF POC: NONE SEEN

## 2021-01-01 MED ORDER — PROMETHAZINE HCL 25 MG/ML IJ SOLN
12.5000 mg | Freq: Once | INTRAMUSCULAR | Status: AC
  Administered 2021-01-01: 12.5 mg via INTRAMUSCULAR
  Filled 2021-01-01: qty 1

## 2021-01-01 MED ORDER — METRONIDAZOLE 500 MG PO TABS: 500.0000 mg | ORAL_TABLET | Freq: Two times a day (BID) | ORAL | 0 refills | Status: DC

## 2021-01-01 MED ORDER — TERCONAZOLE 0.4 % VA CREA: 1.0000 | TOPICAL_CREAM | Freq: Every day | VAGINAL | 0 refills | Status: DC

## 2021-01-01 NOTE — MAU Note (Signed)
Presents with c/o lower abdominal cramping that began 3 weeks ago.  Denies VB.  LMP 12/01/2020.  Pregnancy confirmed 2 yesterday @ MD office.

## 2021-01-01 NOTE — MAU Provider Note (Signed)
History     CSN: [id number]  Arrival date and time: 01/01/21 0847   Event Date/Time   First Provider Initiated Contact with Patient 01/01/21 [phone]      Chief Complaint  Patient presents with   Abdominal Pain   Ms. Nicole Gallegos is a 35 y.o. year old G26P3124 female at [redacted]w[redacted]d weeks gestation who presents to MAU reporting lower abdominal pain x 3 weeks. She reports she "thought it was my period coming on." She had a tubal reversal in [place], [place] in 09/2020. She had a (+) HPT on Sunday 12/27/2020. Her pregnancy was confirmed at [facility] yesterday 12/31/2020. She was advised to come here for ectopic pregnancy w/u.   OB History     Gravida  7   Para  4   Term  3   Preterm  1   AB  2   Living  4      SAB  1   IAB  1   Ectopic  0   Multiple  0   Live Births  4           Past Medical History:  Diagnosis Date   Asthma    Exercise induced;inhaler prn   Constipation    Depression    No meds. currently   Diabetes type 2, uncontrolled (HCC)    History of blood transfusion    Accident at 7 yoa and lost a lot of blood   Migraine    Panic attacks    No meds currently;used to take meds in 2009   Preterm labor 2010   2nd child;had to stop labor 2 times;due to stress   Scoliosis    Seizures (HCC)    if gets overheated;no meds, last seizure 6 yrs ago    Past Surgical History:  Procedure Laterality Date   DILATION AND CURETTAGE OF UTERUS     Suction D&C to remove 16 wk. fetal demise   FOOT SURGERY     R foot, repair torn tissue and skin graft   SKIN GRAFT Right    Upper Thigh   TUBAL LIGATION Bilateral 07/02/2016   Procedure: POST PARTUM TUBAL LIGATION with filshie clips;  Surgeon: Tereso Newcomer, MD;  Location: [facility];  Service: Gynecology;  Laterality: Bilateral;   TUBAL REVERSAL  09/2020   WISDOM TOOTH EXTRACTION  2008    Family History  Problem Relation Age of Onset   Diabetes Mother    COPD Father    Seizures Father        if gets overheated    Seizures Daughter        if gets overheated   Seizures Daughter        if gets overheated   Other Daughter        1st child had 2 vein umbilical cord; Pt did weekly NSTs.   Heart disease Maternal Aunt        Hole in the heart   Heart disease Maternal Uncle        Hole in the heart   Hypertension Maternal Uncle    Hypertension Paternal Uncle    Heart attack Maternal Grandmother        Deceased   Hypertension Maternal Grandmother    Diabetes Paternal Grandmother    Bipolar disorder Cousin        maternal   Depression Cousin        maternal   Anesthesia problems Neg Hx     Social History   Tobacco  Use   Smoking status: Never   Smokeless tobacco: Never  Vaping Use   Vaping Use: Never used  Substance Use Topics   Alcohol use: Not Currently    Comment: occasionally   Drug use: No    Allergies:  Allergies  Allergen Reactions   Chocolate Hives    Medications Prior to Admission  Medication Sig Dispense Refill Last Dose   Prenatal Vit-Fe Fumarate-FA (MULTIVITAMIN-PRENATAL) 27-0.8 MG TABS tablet Take 1 tablet by mouth daily at 12 noon.   12/31/2020 at 0830   albuterol (VENTOLIN HFA) 108 (90 Base) MCG/ACT inhaler INHALE 2 PUFFS BY MOUTH 4 TIMES A DAY DAILY 8.5 g 5 More than a month   promethazine (PHENERGAN) 25 MG tablet Take 1 tablet (25 mg total) by mouth every 6 (six) hours as needed for nausea or vomiting. 30 tablet 0     Review of Systems  Constitutional: Negative.   HENT: Negative.    Eyes: Negative.   Respiratory: Negative.    Cardiovascular: Negative.   Gastrointestinal:  Positive for nausea.  Endocrine: Negative.   Genitourinary:  Positive for pelvic pain. Negative for vaginal bleeding.  Musculoskeletal: Negative.   Skin: Negative.   Allergic/Immunologic: Negative.   Neurological: Negative.   Hematological: Negative.   Psychiatric/Behavioral: Negative.    Physical Exam   Blood pressure 104/78, pulse 79, temperature 98 F (36.7 C), temperature source  Oral, resp. rate 18, height 5\' 7"  (1.702 m), weight 62.2 kg, last menstrual period 12/01/2020, SpO2 100 %.  Physical Exam Vitals and nursing note reviewed. Exam conducted with a chaperone present.  Constitutional:      Appearance: Normal appearance. She is normal weight.  Cardiovascular:     Rate and Rhythm: Normal rate.     Pulses: Normal pulses.  Abdominal:     General: Abdomen is flat.     Palpations: Abdomen is soft.  Genitourinary:    General: Normal vulva.     Comments: Pelvic exam: External genitalia normal, SE: vaginal walls pink and well rugated, cervix is smooth, pink, no lesions, moderate amt of thick, curdy, white vaginal d/c -- WP, GC/CT done, cervix visually closed, Uterus is non-tender, (+) CMT, no cervical friability, no adnexal tenderness.  Musculoskeletal:        General: Normal range of motion.     Cervical back: Normal range of motion.  Skin:    General: Skin is warm and dry.  Neurological:     Mental Status: She is alert and oriented to person, place, and time.  Psychiatric:        Mood and Affect: Mood normal.        Behavior: Behavior normal.        Thought Content: Thought content normal.        Judgment: Judgment normal.    MAU Course  Procedures  MDM [facility]UA UPT CBC ABO/Rh - not drawn, known AB POS HCG Wet Prep GC/CT -- pending HIV -- pending OB < 14 wks 12/03/2020 with TV  Results for orders placed or performed during the hospital encounter of 01/01/21 (from the past 24 hour(s))  Urinalysis, Routine w reflex microscopic Urine, Clean Catch     Status: Abnormal   Collection Time: 01/01/21  9:33 AM  Result Value Ref Range   Color, Urine AMBER (A) YELLOW   APPearance CLOUDY (A) CLEAR   Specific Gravity, Urine 1.027 1.005 - 1.030   pH 5.0 5.0 - 8.0   Glucose, UA NEGATIVE NEGATIVE mg/dL   Hgb urine dipstick  NEGATIVE NEGATIVE   Bilirubin Urine NEGATIVE NEGATIVE   Ketones, ur NEGATIVE NEGATIVE mg/dL   Protein, ur NEGATIVE NEGATIVE mg/dL   Nitrite  NEGATIVE NEGATIVE   Leukocytes,Ua SMALL (A) NEGATIVE   RBC / HPF 0-5 0 - 5 RBC/hpf   WBC, UA 6-10 0 - 5 WBC/hpf   Bacteria, UA MANY (A) NONE SEEN   Squamous Epithelial / LPF 11-20 0 - 5   Mucus PRESENT   Wet prep, genital     Status: Abnormal   Collection Time: 01/01/21  9:33 AM   Specimen: PATH Cytology Cervicovaginal Ancillary Only  Result Value Ref Range   Yeast Wet Prep HPF POC NONE SEEN NONE SEEN   Trich, Wet Prep NONE SEEN NONE SEEN   Clue Cells Wet Prep HPF POC PRESENT (A) NONE SEEN   WBC, Wet Prep HPF POC MANY (A) NONE SEEN   Sperm NONE SEEN   CBC     Status: Abnormal   Collection Time: 01/01/21  9:39 AM  Result Value Ref Range   WBC 3.6 (L) 4.0 - 10.5 K/uL   RBC 4.74 3.87 - 5.11 MIL/uL   Hemoglobin 14.8 12.0 - 15.0 g/dL   HCT 69.644.1 29.536.0 - 28.446.0 %   MCV 93.0 80.0 - 100.0 fL   MCH 31.2 26.0 - 34.0 pg   MCHC 33.6 30.0 - 36.0 g/dL   RDW 13.212.5 44.011.5 - 10.215.5 %   Platelets 245 150 - 400 K/uL   nRBC 0.0 0.0 - 0.2 %  hCG, quantitative, pregnancy     Status: Abnormal   Collection Time: 01/01/21  9:39 AM  Result Value Ref Range   hCG, Beta Chain, Quant, S 517 (H) <5 mIU/mL    US OB LESS THAN 14 WEEKS WITH OB TRANSVAGINAL  Result Date: 01/01/2021 CLINICAL DATA:  Abdominal pain affecting first trimester of pregnancy, LMP 12/01/2020 EXAM: OBSTETRIC <14 WK US AND TRANSVAGINAL OB US TECHNIQUE: Both transabdominal and transvaginal ultrasound examinations were performed for complete evaluation of the gestation as well as the maternal uterus, adnexal regions, and pelvic cul-de-sac. Transvaginal technique was performed to assess early pregnancy. COMPARISON:  None FINDINGS: Intrauterine gestational sac: Absent Yolk sac:  N/A Embryo:  N/A Cardiac Activity: N/A Heart Rate: N/A  bpm MSD:   mm    w     d CRL:    mm    w    d                  US EDC: Subchorionic hemorrhage:  N/A Maternal uterus/adnexae: Uterus anteverted, normal appearance without mass. Small nabothian cyst incidentally noted at  cervix. Endometrial complex 13 mm thick without mass, fluid, or gestational sac. RIGHT ovary measures 3.1 x 2.5 x 3.0 cm and contains a small corpus luteum. LEFT ovary normal size and morphology, 2.9 x 1.6 x 1.8 cm. Trace free pelvic fluid. No adnexal masses. IMPRESSION: No intrauterine gestation identified. Findings are consistent with pregnancy of unknown location. Differential diagnosis includes early intrauterine pregnancy too early to visualize, spontaneous abortion, and ectopic pregnancy. Serial quantitative beta HCG and or follow-up ultrasound recommended to definitively exclude ectopic pregnancy. Electronically Signed   By: Ulyses SouthwardMark  Boles M.D.   On: 01/01/2021 10:53     Assessment and Plan  Pregnancy of unknown anatomic location  - F/U in MAU on Sunday 01/03/2021 for repeat HCG - Information provided on ectopic pregnancy   Abdominal cramping affecting pregnancy  - OK to take Tylenol 1000 mg prn pain  Bacterial  vaginosis - Information provided on BV - Rx for Flagyl 500 mg BID x 7 days   Candida vaginitis  - Information provided on yeast - Rx for Terazol 0.4% vaginally x 5 days  - Advised to use at the completion of BV medication course  [redacted] weeks gestation of pregnancy  - Discharge home - Return to MAU for repeat HCG on Sunday 01/03/2021 - Patient verbalized an understanding of the plan of care and agrees.    Raelyn Mora, CNM 01/01/2021, 9:26 AM

## 2021-01-01 NOTE — Discharge Instructions (Addendum)
Please return to MAU for repeat blood pregnancy hormone level on Sunday 01/03/2021 between 8:00 AM and 8:30 AM.  Please start the medication for yeast AFTER you have completely taken the BV medication.  Take Tylenol 1000 mg every 6 hours as needed for pain

## 2021-01-01 NOTE — Telephone Encounter (Addendum)
-----   Message from Tereso Newcomer, MD sent at 12/31/2020  4:56 PM EDT ----- If she is still having pain, she needs evaluation for ectopic pregnancy. Will need to go to MAU.   Jaynie Collins, MD  ----- Message ----- From: Marjo Bicker, RN Sent: 12/31/2020   4:50 PM EDT To: Tereso Newcomer, MD    Called pt; provider recommendation given. Pt states intent to go to MAU immediately. MAU notified pt is on the way, needing ectopic rule out per Anyanwu, MD.

## 2021-01-03 ENCOUNTER — Other Ambulatory Visit: Payer: Self-pay

## 2021-01-03 ENCOUNTER — Inpatient Hospital Stay (HOSPITAL_COMMUNITY)
Admission: AD | Admit: 2021-01-03 | Discharge: 2021-01-03 | Disposition: A | Discharge status: Home / Self Care | Payer: 59 | Attending: Obstetrics & Gynecology | Admitting: Obstetrics & Gynecology

## 2021-01-03 DIAGNOSIS — O3680X0 Pregnancy with inconclusive fetal viability, not applicable or unspecified: Secondary | ICD-10-CM | POA: Insufficient documentation

## 2021-01-03 DIAGNOSIS — Z3A01 Less than 8 weeks gestation of pregnancy: Secondary | ICD-10-CM | POA: Diagnosis not present

## 2021-01-03 DIAGNOSIS — Z3687 Encounter for antenatal screening for uncertain dates: Secondary | ICD-10-CM | POA: Diagnosis not present

## 2021-01-03 LAB — HCG, QUANTITATIVE, PREGNANCY: hCG, Beta Chain, Quant, S: 1055 m[IU]/mL — ABNORMAL HIGH (ref <5)

## 2021-01-03 NOTE — MAU Provider Note (Signed)
History   Chief Complaint:  Follow-up   Nicole Gallegos is  35 y.o. X6P5374 Patient's last menstrual period was 12/01/2020 (exact date).. Patient is here for follow up of quantitative HCG and ongoing surveillance of pregnancy status. She is [redacted]w[redacted]d weeks gestation  by LMP.    Since her last visit, the patient is without new complaint. The patient reports bleeding as  none now.  She denies any pain.  General ROS:  negative  Her previous Quantitative HCG values are:  Component     Latest Ref Rng & Units 01/01/2021  HCG, Beta Chain, Quant, S     <5 mIU/mL 517 (H)     Physical Exam   Blood pressure 107/68, pulse 86, temperature 98 F (36.7 C), temperature source Oral, resp. rate 18, last menstrual period 12/01/2020, SpO2 100 %.  Physical Examination: General appearance - alert, well appearing, and in no distress Mental status - normal mood, behavior, speech, dress, motor activity, and thought processes Eyes - sclera anicteric Chest - normal respiratory effort  Labs: Results for orders placed or performed during the hospital encounter of 01/03/21 (from the past 24 hour(s))  hCG, quantitative, pregnancy   Collection Time: 01/03/21  7:54 AM  Result Value Ref Range   hCG, Beta Chain, Quant, S 1,055 (H) <5 mIU/mL    Ultrasound Studies:   No results found.  Assessment:   1. Pregnancy of unknown anatomic location   2. [redacted] weeks gestation of pregnancy     -Appropriate rise in HCG. Will get 1 more on Tuesday - if continues to rise appropriately will get outpatient viability/dating ultrasound. She is scheduled for new ob at [facility] in August  Plan: -Discharge home in stable condition -SAB vs ectopic precautions discussed -Patient advised to follow-up with [facility] on Tuesday for stat hcg -Patient may return to MAU as needed or if her condition were to change or worsen  Judeth Horn, NP 01/03/2021, 10:14 AM

## 2021-01-03 NOTE — MAU Note (Signed)
Pt left after speaking with Judeth Horn NP, provider states this is okay. Pt to remain on the board to follow results. NP will call pt with results.

## 2021-01-03 NOTE — Discharge Instructions (Signed)
Return to care  If you have heavier bleeding that soaks through more that 2 pads per hour for an hour or more If you bleed so much that you feel like you might pass out or you do pass out If you have significant abdominal pain that is not improved with Tylenol   

## 2021-01-03 NOTE — MAU Note (Signed)
Nicole Gallegos is a 35 y.o. at [redacted]w[redacted]d here in MAU reporting: here for follow up hcg. Denies pain and bleeding.  Onset of complaint: ongoing  Pain score: 0/10  Vitals:   01/03/21 0800  BP: 107/68  Pulse: 86  Resp: 18  Temp: 98 F (36.7 C)  SpO2: 100%     Lab orders placed from triage: hcg

## 2021-01-04 ENCOUNTER — Other Ambulatory Visit: Payer: Self-pay

## 2021-01-04 DIAGNOSIS — B9689 Other specified bacterial agents as the cause of diseases classified elsewhere: Secondary | ICD-10-CM

## 2021-01-04 DIAGNOSIS — N760 Acute vaginitis: Secondary | ICD-10-CM

## 2021-01-04 LAB — GC/CHLAMYDIA PROBE AMP (~~LOC~~) NOT AT ARMC
Chlamydia: NEGATIVE
Comment: NEGATIVE
Comment: NORMAL
Neisseria Gonorrhea: NEGATIVE

## 2021-01-04 MED ORDER — METRONIDAZOLE 0.75 % VA GEL: 1.0000 | Freq: Two times a day (BID) | VAGINAL | 0 refills | Status: DC

## 2021-01-05 ENCOUNTER — Other Ambulatory Visit: Payer: Self-pay

## 2021-01-05 ENCOUNTER — Other Ambulatory Visit (INDEPENDENT_AMBULATORY_CARE_PROVIDER_SITE_OTHER): Payer: 59

## 2021-01-05 VITALS — BP 114/77 | HR 82 | Wt 138.0 lb

## 2021-01-05 DIAGNOSIS — O3680X0 Pregnancy with inconclusive fetal viability, not applicable or unspecified: Secondary | ICD-10-CM

## 2021-01-05 LAB — BETA HCG QUANT (REF LAB): hCG Quant: 2232 m[IU]/mL

## 2021-01-05 NOTE — Progress Notes (Signed)
Call placed to pt with results. Reviewed results with Dr Myriam Jacobson. Pt advised appropriate rise in hcg levels and to have OB US in 10-14 days. Pt agreeable to plan of care.   OB US scheduled for 7/11@ 8am. Pt agreeable to date and time of appt. Pt advised if has any vaginal bleeding, severe abd pain to go to MAU. Pt verbalized understanding.   Judeth Cornfield, RN

## 2021-01-05 NOTE — Progress Notes (Signed)
Nicole Gallegos presents to [facility] for follow-up beta HCG blood draw today. She was seen in MAU for ectopic rule out due to being s/p tubal reversal. Patient denies pain or bleeding today. Discussed with patient, we are following HCG levels today. Results will be back in approximately 2 hours. Valid contact number for patient confirmed. Pt will be called with results.   Marjo Bicker 01/05/2021 9:18 AM

## 2021-01-06 NOTE — Progress Notes (Signed)
I agree with the findings and care plan as documented in the RN note.    Jesenya Bowditch, MD OB Family Medicine Fellow, [facility], [facility]  

## 2021-01-07 ENCOUNTER — Ambulatory Visit: Payer: 59

## 2021-01-18 ENCOUNTER — Ambulatory Visit
Admission: RE | Admit: 2021-01-18 | Discharge: 2021-01-18 | Disposition: A | Discharge status: Home / Self Care | Payer: 59 | Source: Ambulatory Visit | Attending: Obstetrics and Gynecology | Admitting: Obstetrics and Gynecology

## 2021-01-18 ENCOUNTER — Other Ambulatory Visit: Payer: Self-pay

## 2021-01-18 ENCOUNTER — Telehealth: Payer: Self-pay | Admitting: Medical

## 2021-01-18 DIAGNOSIS — O3680X0 Pregnancy with inconclusive fetal viability, not applicable or unspecified: Secondary | ICD-10-CM | POA: Insufficient documentation

## 2021-01-18 DIAGNOSIS — Z3201 Encounter for pregnancy test, result positive: Secondary | ICD-10-CM | POA: Diagnosis not present

## 2021-01-18 DIAGNOSIS — Z3A01 Less than 8 weeks gestation of pregnancy: Secondary | ICD-10-CM | POA: Diagnosis not present

## 2021-01-18 NOTE — Telephone Encounter (Signed)
I called Nicole Gallegos today at 12:37 PM and confirmed patient's identity using two patient identifiers. [place] results from earlier today were reviewed. Patient is scheduled for new OB visit at [facility] on 02/17/21. The patient requested that the visit be moved to a different date. In-basket message sent to admin pool to change appt. First trimester warning signs reviewed. Patient voiced understanding and had no further questions.   US OB Transvaginal  Result Date: 01/18/2021 CLINICAL DATA:  Positive pregnancy test. EXAM: OBSTETRIC <14 WK [place] AND TRANSVAGINAL OB US TECHNIQUE: Both transabdominal and transvaginal ultrasound examinations were performed for complete evaluation of the gestation as well as the maternal uterus, adnexal regions, and pelvic cul-de-sac. Transvaginal technique was performed to assess early pregnancy. COMPARISON:  01/01/2021 FINDINGS: Intrauterine gestational sac: Single Yolk sac:  Visualized Embryo:  Visualized Cardiac Activity: Visualized Heart Rate: 129 bpm CRL: 8.2 mm   6 w   5 d                  [place] EDC: 09/08/2021 Subchorionic hemorrhage:  None visualized. Maternal uterus/adnexae: Unremarkable maternal ovaries. Tiny volume free fluid in the cul-de-sac. IMPRESSION: Single living intrauterine gestation at estimated 6 week 5 day gestational age by crown-rump length. Electronically Signed   By: Kennith Center M.D.   On: 01/18/2021 08:46    Marny Lowenstein, PA-C 01/18/2021 12:37 PM

## 2021-02-15 DIAGNOSIS — M5489 Other dorsalgia: Secondary | ICD-10-CM | POA: Diagnosis not present

## 2021-02-17 ENCOUNTER — Encounter: Payer: 59 | Admitting: Family Medicine

## 2021-02-18 ENCOUNTER — Other Ambulatory Visit: Payer: Self-pay

## 2021-02-18 ENCOUNTER — Inpatient Hospital Stay (HOSPITAL_COMMUNITY)
Admission: AD | Admit: 2021-02-18 | Discharge: 2021-02-19 | Disposition: A | Discharge status: Home / Self Care | Payer: 59 | Attending: Obstetrics & Gynecology | Admitting: Obstetrics & Gynecology

## 2021-02-18 ENCOUNTER — Encounter (HOSPITAL_COMMUNITY): Payer: Self-pay | Admitting: Obstetrics & Gynecology

## 2021-02-18 DIAGNOSIS — R519 Headache, unspecified: Secondary | ICD-10-CM | POA: Diagnosis not present

## 2021-02-18 DIAGNOSIS — R0789 Other chest pain: Secondary | ICD-10-CM

## 2021-02-18 DIAGNOSIS — Z3A11 11 weeks gestation of pregnancy: Secondary | ICD-10-CM | POA: Diagnosis not present

## 2021-02-18 DIAGNOSIS — Z79899 Other long term (current) drug therapy: Secondary | ICD-10-CM | POA: Diagnosis not present

## 2021-02-18 DIAGNOSIS — R030 Elevated blood-pressure reading, without diagnosis of hypertension: Secondary | ICD-10-CM | POA: Diagnosis not present

## 2021-02-18 DIAGNOSIS — R079 Chest pain, unspecified: Secondary | ICD-10-CM | POA: Insufficient documentation

## 2021-02-18 DIAGNOSIS — M79602 Pain in left arm: Secondary | ICD-10-CM | POA: Diagnosis not present

## 2021-02-18 DIAGNOSIS — O26891 Other specified pregnancy related conditions, first trimester: Secondary | ICD-10-CM | POA: Diagnosis not present

## 2021-02-18 DIAGNOSIS — M62838 Other muscle spasm: Secondary | ICD-10-CM

## 2021-02-18 DIAGNOSIS — M79622 Pain in left upper arm: Secondary | ICD-10-CM | POA: Insufficient documentation

## 2021-02-18 DIAGNOSIS — R1032 Left lower quadrant pain: Secondary | ICD-10-CM

## 2021-02-18 DIAGNOSIS — M542 Cervicalgia: Secondary | ICD-10-CM | POA: Insufficient documentation

## 2021-02-18 DIAGNOSIS — O99891 Other specified diseases and conditions complicating pregnancy: Secondary | ICD-10-CM | POA: Diagnosis not present
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
## 2021-02-18 NOTE — MAU Note (Signed)
Patient reports taking her BP after feeling pain in her ABD cramping, arm and HA. BP was 130/106. Patient immediately called nurse on call for OB practice. Patient reports that pain in arm and cramping is constant and increasing in intensity.

## 2021-02-18 NOTE — MAU Note (Addendum)
About an hour ago I started having pain in my L side and then my L arm. Then started having some chest pain and then a bad headache. Now having cramping in LLQ of stomach, L arm, and seeing stars. L arm is shooting pain and "I cannot even feel my fingers". Thought may be panic attack but have not had one in 2 yrs. Denies VB. Took one extra strength Tylenol at 1800 but did not help

## 2021-02-19 DIAGNOSIS — O26891 Other specified pregnancy related conditions, first trimester: Secondary | ICD-10-CM | POA: Diagnosis not present

## 2021-02-19 DIAGNOSIS — R030 Elevated blood-pressure reading, without diagnosis of hypertension: Secondary | ICD-10-CM | POA: Diagnosis not present

## 2021-02-19 DIAGNOSIS — M62838 Other muscle spasm: Secondary | ICD-10-CM | POA: Diagnosis not present

## 2021-02-19 DIAGNOSIS — M542 Cervicalgia: Secondary | ICD-10-CM | POA: Diagnosis not present

## 2021-02-19 DIAGNOSIS — Z3A11 11 weeks gestation of pregnancy: Secondary | ICD-10-CM | POA: Diagnosis not present

## 2021-02-19 DIAGNOSIS — R079 Chest pain, unspecified: Secondary | ICD-10-CM | POA: Diagnosis not present

## 2021-02-19 DIAGNOSIS — M79622 Pain in left upper arm: Secondary | ICD-10-CM | POA: Diagnosis not present

## 2021-02-19 DIAGNOSIS — R519 Headache, unspecified: Secondary | ICD-10-CM | POA: Diagnosis not present

## 2021-02-19 DIAGNOSIS — R1032 Left lower quadrant pain: Secondary | ICD-10-CM | POA: Diagnosis not present

## 2021-02-19 LAB — TROPONIN I (HIGH SENSITIVITY): Troponin I (High Sensitivity): 6 ng/L (ref <18)

## 2021-02-19 LAB — URINALYSIS, ROUTINE W REFLEX MICROSCOPIC
Bilirubin Urine: NEGATIVE
Glucose, UA: NEGATIVE mg/dL
Hgb urine dipstick: NEGATIVE
Ketones, ur: 5 mg/dL — AB
Nitrite: NEGATIVE
Protein, ur: NEGATIVE mg/dL
Specific Gravity, Urine: 1.023 (ref 1.005–1.030)
pH: 6 (ref 5.0–8.0)

## 2021-02-19 LAB — BRAIN NATRIURETIC PEPTIDE: B Natriuretic Peptide: 27.9 pg/mL (ref 0.0–100.0)

## 2021-02-19 MED ORDER — CYCLOBENZAPRINE HCL 5 MG PO TABS: 5.0000 mg | ORAL_TABLET | Freq: Three times a day (TID) | ORAL | 0 refills | Status: DC | PRN

## 2021-02-19 MED ORDER — ACETAMINOPHEN 500 MG PO TABS
1000.0000 mg | ORAL_TABLET | Freq: Once | ORAL | Status: AC
  Administered 2021-02-19: 1000 mg via ORAL
  Filled 2021-02-19: qty 2

## 2021-02-26 ENCOUNTER — Other Ambulatory Visit (HOSPITAL_COMMUNITY)
Admission: RE | Admit: 2021-02-26 | Discharge: 2021-02-26 | Disposition: A | Discharge status: Home / Self Care | Payer: 59 | Source: Ambulatory Visit | Attending: Family Medicine | Admitting: Family Medicine

## 2021-02-26 ENCOUNTER — Encounter: Payer: Self-pay | Admitting: Obstetrics and Gynecology

## 2021-02-26 ENCOUNTER — Other Ambulatory Visit: Payer: Self-pay

## 2021-02-26 ENCOUNTER — Ambulatory Visit (INDEPENDENT_AMBULATORY_CARE_PROVIDER_SITE_OTHER): Payer: 59 | Admitting: Obstetrics and Gynecology

## 2021-02-26 VITALS — BP 104/72 | HR 120 | Wt 138.1 lb

## 2021-02-26 DIAGNOSIS — O99511 Diseases of the respiratory system complicating pregnancy, first trimester: Secondary | ICD-10-CM

## 2021-02-26 DIAGNOSIS — B9689 Other specified bacterial agents as the cause of diseases classified elsewhere: Secondary | ICD-10-CM

## 2021-02-26 DIAGNOSIS — F419 Anxiety disorder, unspecified: Secondary | ICD-10-CM

## 2021-02-26 DIAGNOSIS — K219 Gastro-esophageal reflux disease without esophagitis: Secondary | ICD-10-CM

## 2021-02-26 DIAGNOSIS — Z31430 Encounter of female for testing for genetic disease carrier status for procreative management: Secondary | ICD-10-CM | POA: Diagnosis not present

## 2021-02-26 DIAGNOSIS — N760 Acute vaginitis: Secondary | ICD-10-CM

## 2021-02-26 DIAGNOSIS — O09899 Supervision of other high risk pregnancies, unspecified trimester: Secondary | ICD-10-CM

## 2021-02-26 DIAGNOSIS — O152 Eclampsia in the puerperium: Secondary | ICD-10-CM

## 2021-02-26 DIAGNOSIS — Z3482 Encounter for supervision of other normal pregnancy, second trimester: Secondary | ICD-10-CM | POA: Diagnosis not present

## 2021-02-26 DIAGNOSIS — O151 Eclampsia in labor: Secondary | ICD-10-CM

## 2021-02-26 DIAGNOSIS — F32A Depression, unspecified: Secondary | ICD-10-CM

## 2021-02-26 DIAGNOSIS — R7989 Other specified abnormal findings of blood chemistry: Secondary | ICD-10-CM

## 2021-02-26 DIAGNOSIS — Z9889 Other specified postprocedural states: Secondary | ICD-10-CM

## 2021-02-26 DIAGNOSIS — O0990 Supervision of high risk pregnancy, unspecified, unspecified trimester: Secondary | ICD-10-CM | POA: Diagnosis not present

## 2021-02-26 DIAGNOSIS — G43909 Migraine, unspecified, not intractable, without status migrainosus: Secondary | ICD-10-CM

## 2021-02-26 DIAGNOSIS — J45909 Unspecified asthma, uncomplicated: Secondary | ICD-10-CM

## 2021-02-26 DIAGNOSIS — G40909 Epilepsy, unspecified, not intractable, without status epilepticus: Secondary | ICD-10-CM

## 2021-02-26 DIAGNOSIS — O99351 Diseases of the nervous system complicating pregnancy, first trimester: Secondary | ICD-10-CM

## 2021-02-26 MED ORDER — MAGNESIUM MALATE 1250 (141.7 MG) MG PO TABS: 1.0000 | ORAL_TABLET | Freq: Every day | ORAL | Status: DC | PRN

## 2021-02-26 MED ORDER — ASPIRIN EC 81 MG PO TBEC
81.0000 mg | DELAYED_RELEASE_TABLET | Freq: Every day | ORAL | 2 refills | Status: DC
Start: 2021-02-26 — End: 2021-08-31

## 2021-02-26 MED ORDER — MICONAZOLE NITRATE 2 % VA CREA: 1.0000 | TOPICAL_CREAM | Freq: Every day | VAGINAL | 2 refills | Status: DC

## 2021-02-26 MED ORDER — FAMOTIDINE 20 MG PO TABS: 20.0000 mg | ORAL_TABLET | Freq: Two times a day (BID) | ORAL | 3 refills | Status: DC

## 2021-02-26 MED ORDER — METRONIDAZOLE 0.75 % VA GEL: 1.0000 | Freq: Two times a day (BID) | VAGINAL | 0 refills | Status: DC

## 2021-02-26 MED ORDER — FLUTICASONE PROPIONATE HFA 110 MCG/ACT IN AERO: 1.0000 | INHALATION_SPRAY | Freq: Two times a day (BID) | RESPIRATORY_TRACT | 12 refills | Status: DC

## 2021-02-26 MED ORDER — BLOOD PRESSURE KIT DEVI: 1.0000 | 0 refills | Status: AC | PRN

## 2021-02-26 MED ORDER — GOJJI WEIGHT SCALE MISC: 1.0000 | 0 refills | Status: DC | PRN

## 2021-02-26 NOTE — Progress Notes (Signed)
Called placed to [facility] Neurology, confirmed that referral had been received but that referral coordinator was not in office today but would be calling patient to schedule appointment on Monday. Patient was given above information regarding referral scheduling, verbalized understanding.   Wynona Canes, CMA
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
## 2021-02-27 LAB — PROTEIN / CREATININE RATIO, URINE
Creatinine, Urine: 213.5 mg/dL
Protein, Ur: 25.3 mg/dL
Protein/Creat Ratio: 119 mg/g creat (ref 0–200)

## 2021-02-28 LAB — CULTURE, OB URINE

## 2021-02-28 LAB — URINE CULTURE, OB REFLEX

## 2021-03-01 ENCOUNTER — Encounter: Payer: Self-pay | Admitting: *Deleted

## 2021-03-01 LAB — COMPREHENSIVE METABOLIC PANEL
ALT: 9 IU/L (ref 0–32)
AST: 13 IU/L (ref 0–40)
Albumin/Globulin Ratio: 1.4 (ref 1.2–2.2)
Albumin: 3.6 g/dL — ABNORMAL LOW (ref 3.8–4.8)
Alkaline Phosphatase: 82 IU/L (ref 44–121)
BUN/Creatinine Ratio: 13 (ref 9–23)
BUN: 8 mg/dL (ref 6–20)
Bilirubin Total: 0.2 mg/dL (ref 0.0–1.2)
CO2: 23 mmol/L (ref 20–29)
Calcium: 9.2 mg/dL (ref 8.7–10.2)
Chloride: 102 mmol/L (ref 96–106)
Creatinine, Ser: 0.63 mg/dL (ref 0.57–1.00)
Globulin, Total: 2.6 g/dL (ref 1.5–4.5)
Glucose: 79 mg/dL (ref 65–99)
Potassium: 4.6 mmol/L (ref 3.5–5.2)
Sodium: 136 mmol/L (ref 134–144)
Total Protein: 6.2 g/dL (ref 6.0–8.5)
eGFR: 119 mL/min/{1.73_m2} (ref >59)

## 2021-03-01 LAB — CBC/D/PLT+RPR+RH+ABO+RUBIGG...
Antibody Screen: NEGATIVE
Basophils Absolute: 0 10*3/uL (ref 0.0–0.2)
Basos: 0 %
EOS (ABSOLUTE): 0.1 10*3/uL (ref 0.0–0.4)
Eos: 1 %
HCV Ab: <0.1 s/co ratio (ref 0.0–0.9)
HIV Screen 4th Generation wRfx: NONREACTIVE
Hematocrit: 41.2 % (ref 34.0–46.6)
Hemoglobin: 13.8 g/dL (ref 11.1–15.9)
Hepatitis B Surface Ag: NEGATIVE
Immature Grans (Abs): 0 10*3/uL (ref 0.0–0.1)
Immature Granulocytes: 0 %
Lymphocytes Absolute: 1.4 10*3/uL (ref 0.7–3.1)
Lymphs: 19 %
MCH: 30.8 pg (ref 26.6–33.0)
MCHC: 33.5 g/dL (ref 31.5–35.7)
MCV: 92 fL (ref 79–97)
Monocytes Absolute: 0.6 10*3/uL (ref 0.1–0.9)
Monocytes: 8 %
Neutrophils Absolute: 5.3 10*3/uL (ref 1.4–7.0)
Neutrophils: 72 %
Platelets: 240 10*3/uL (ref 150–450)
RBC: 4.48 x10E6/uL (ref 3.77–5.28)
RDW: 12.3 % (ref 11.7–15.4)
RPR Ser Ql: NONREACTIVE
Rh Factor: POSITIVE
Rubella Antibodies, IGG: 3.25 index (ref >0.99)
WBC: 7.4 10*3/uL (ref 3.4–10.8)

## 2021-03-01 LAB — HEMOGLOBIN A1C
Est. average glucose Bld gHb Est-mCnc: 105 mg/dL
Hgb A1c MFr Bld: 5.3 % (ref 4.8–5.6)

## 2021-03-01 LAB — CYTOLOGY - PAP
Adequacy: ABSENT
Chlamydia: NEGATIVE
Comment: NEGATIVE
Comment: NEGATIVE
Comment: NORMAL
Diagnosis: NEGATIVE
High risk HPV: NEGATIVE
Neisseria Gonorrhea: NEGATIVE

## 2021-03-01 LAB — HCV INTERPRETATION

## 2021-03-01 LAB — TSH: TSH: 0.007 u[IU]/mL — ABNORMAL LOW (ref 0.450–4.500)

## 2021-03-02 ENCOUNTER — Encounter: Payer: Self-pay | Admitting: Obstetrics and Gynecology

## 2021-03-02 DIAGNOSIS — O09899 Supervision of other high risk pregnancies, unspecified trimester: Secondary | ICD-10-CM | POA: Insufficient documentation

## 2021-03-02 DIAGNOSIS — Z9889 Other specified postprocedural states: Secondary | ICD-10-CM | POA: Insufficient documentation

## 2021-03-04 ENCOUNTER — Encounter: Payer: Self-pay | Admitting: Obstetrics and Gynecology

## 2021-03-04 ENCOUNTER — Other Ambulatory Visit: Payer: Self-pay

## 2021-03-04 ENCOUNTER — Other Ambulatory Visit: Payer: Self-pay | Admitting: *Deleted

## 2021-03-04 DIAGNOSIS — R7989 Other specified abnormal findings of blood chemistry: Secondary | ICD-10-CM

## 2021-03-04 DIAGNOSIS — O0990 Supervision of high risk pregnancy, unspecified, unspecified trimester: Secondary | ICD-10-CM

## 2021-03-04 HISTORY — DX: Other specified abnormal findings of blood chemistry: R79.89

## 2021-03-04 NOTE — Addendum Note (Signed)
Addended by: Solomons Bing on: 03/04/2021 12:01 PM   Modules accepted: Orders

## 2021-03-06 LAB — SPECIMEN STATUS REPORT

## 2021-03-06 LAB — T4, FREE: Free T4: 1.37 ng/dL (ref 0.82–1.77)

## 2021-03-17 ENCOUNTER — Telehealth: Payer: Self-pay | Admitting: Lactation Services

## 2021-03-17 NOTE — Telephone Encounter (Signed)
Called and spoke with patient at request of Brett Albino.   Mom reports she tried to BF in 2010 and had a bad experience. She has cracked bleeding nipples and milk supply was low. She did not BF her 3rd daughter. She did have a son 4 years ago and infant latched well and was not able to continue to BF due to a stroke and seizures at one week of infants life.   She reports she would like to BF this infant. She is eating Lactation Cookies currently. She is concerned she cannot produce a lot of milk. She did produce milk after her other infants are born and iced them to dry them up.   Reviewed supply and demand, skin to skin, hand expression, the early days of life and breast feeding, milk coming to volume, colostrum and protecting milk supply if infant will not latch.   Reviewed Lactation support in the hospital and after discharge and BF Support groups.   Reviewed [email or web address] and ability to get breast pump in hospital from [facility]  Mom to call or send in My Chart message with any further questions or concerns.

## 2021-03-23 ENCOUNTER — Encounter: Payer: Self-pay | Admitting: Family Medicine

## 2021-03-23 ENCOUNTER — Ambulatory Visit (INDEPENDENT_AMBULATORY_CARE_PROVIDER_SITE_OTHER): Payer: 59 | Admitting: Family Medicine

## 2021-03-23 ENCOUNTER — Other Ambulatory Visit: Payer: Self-pay

## 2021-03-23 VITALS — BP 109/72 | HR 118 | Wt 146.0 lb

## 2021-03-23 DIAGNOSIS — O99351 Diseases of the nervous system complicating pregnancy, first trimester: Secondary | ICD-10-CM

## 2021-03-23 DIAGNOSIS — R7989 Other specified abnormal findings of blood chemistry: Secondary | ICD-10-CM

## 2021-03-23 DIAGNOSIS — O0990 Supervision of high risk pregnancy, unspecified, unspecified trimester: Secondary | ICD-10-CM

## 2021-03-23 DIAGNOSIS — O09899 Supervision of other high risk pregnancies, unspecified trimester: Secondary | ICD-10-CM

## 2021-03-23 DIAGNOSIS — G43009 Migraine without aura, not intractable, without status migrainosus: Secondary | ICD-10-CM

## 2021-03-23 DIAGNOSIS — Z8759 Personal history of other complications of pregnancy, childbirth and the puerperium: Secondary | ICD-10-CM

## 2021-03-23 DIAGNOSIS — F32A Depression, unspecified: Secondary | ICD-10-CM

## 2021-03-23 DIAGNOSIS — F419 Anxiety disorder, unspecified: Secondary | ICD-10-CM

## 2021-03-23 DIAGNOSIS — G40909 Epilepsy, unspecified, not intractable, without status epilepticus: Secondary | ICD-10-CM

## 2021-03-23 DIAGNOSIS — Z9889 Other specified postprocedural states: Secondary | ICD-10-CM

## 2021-03-23 NOTE — Patient Instructions (Signed)

## 2021-03-23 NOTE — Progress Notes (Signed)
   Subjective:  Nicole Gallegos is a 35 y.o. Y5T0931 at [redacted]w[redacted]d being seen today for ongoing prenatal care.  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  Patient reports no complaints.  Contractions: Not present. Vag. Bleeding: None.  Movement: Present. Denies leaking of fluid.   The following portions of the patient's history were reviewed and updated as appropriate: allergies, current medications, past family history, past medical history, past social history, past surgical history and problem list. Problem list updated.  Objective:   Vitals:   03/23/21 1018  BP: 109/72  Pulse: (!) 118  Weight: 146 lb (66.2 kg)    Fetal Status: Fetal Heart Rate (bpm): 143   Movement: Present     General:  Alert, oriented and cooperative. Patient is in no acute distress.  Skin: Skin is warm and dry. No rash noted.   Cardiovascular: Normal heart rate noted  Respiratory: Normal respiratory effort, no problems with respiration noted  Abdomen: Soft, gravid, appropriate for gestational age. Pain/Pressure: Absent     Pelvic: Vag. Bleeding: None     Cervical exam deferred        Extremities: Normal range of motion.  Edema: None  Mental Status: Normal mood and affect. Normal behavior. Normal judgment and thought content.   Urinalysis:      Assessment and Plan:  Pregnancy: P2T6244 at [redacted]w[redacted]d  1. Supervision of high risk pregnancy, antepartum BP and FHR normal AFP today - [place] MFM OB DETAIL +14 WK; Future - AFP, Serum, Open Spina Bifida  2. Migraine without aura and  without status migrainosus, not intractable Referred to Neuro last visit, currently scheduled for 06/23/2021 but on call out list  3. 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 History of pre-eclampsia On ASA  5. Anxiety and depression Stable  6. History of reversal of tubal ligation   7. Low TSH level Free T4 normal  8. History of preterm delivery, currently pregnant At [redacted] weeks  Preterm labor symptoms and general obstetric precautions including but not limited to vaginal bleeding, contractions, leaking of fluid and fetal movement were reviewed in detail with the patient. Please refer to After Visit Summary for other counseling recommendations.  Return in 4 weeks (on 04/20/2021) for [facility], ob visit.   Venora Maples, MD

## 2021-03-25 LAB — AFP, SERUM, OPEN SPINA BIFIDA
AFP MoM: 1.33
AFP Value: 52.2 ng/mL
Gest. Age on Collection Date: 16 weeks
Maternal Age At EDD: 34.6 yr
OSBR Risk 1 IN: 8798
Test Results:: NEGATIVE
Weight: 146 [lb_av]

## 2021-04-14 ENCOUNTER — Ambulatory Visit: Payer: Medicaid Other | Admitting: *Deleted

## 2021-04-14 ENCOUNTER — Other Ambulatory Visit: Payer: Self-pay

## 2021-04-14 ENCOUNTER — Ambulatory Visit: Payer: Medicaid Other | Attending: Family Medicine

## 2021-04-14 ENCOUNTER — Encounter: Payer: Self-pay | Admitting: *Deleted

## 2021-04-14 VITALS — BP 107/60 | HR 108

## 2021-04-14 DIAGNOSIS — O0990 Supervision of high risk pregnancy, unspecified, unspecified trimester: Secondary | ICD-10-CM | POA: Diagnosis not present

## 2021-04-15 ENCOUNTER — Other Ambulatory Visit: Payer: Self-pay | Admitting: *Deleted

## 2021-04-15 DIAGNOSIS — Z362 Encounter for other antenatal screening follow-up: Secondary | ICD-10-CM

## 2021-04-16 ENCOUNTER — Encounter: Payer: Self-pay | Admitting: Radiology

## 2021-04-20 ENCOUNTER — Ambulatory Visit (INDEPENDENT_AMBULATORY_CARE_PROVIDER_SITE_OTHER): Payer: Medicaid Other | Admitting: Family Medicine

## 2021-04-20 ENCOUNTER — Encounter: Payer: Self-pay | Admitting: Family Medicine

## 2021-04-20 ENCOUNTER — Other Ambulatory Visit: Payer: Self-pay

## 2021-04-20 VITALS — BP 111/72 | HR 116 | Wt 155.0 lb

## 2021-04-20 DIAGNOSIS — F419 Anxiety disorder, unspecified: Secondary | ICD-10-CM

## 2021-04-20 DIAGNOSIS — Z23 Encounter for immunization: Secondary | ICD-10-CM

## 2021-04-20 DIAGNOSIS — O0990 Supervision of high risk pregnancy, unspecified, unspecified trimester: Secondary | ICD-10-CM

## 2021-04-20 DIAGNOSIS — Z9889 Other specified postprocedural states: Secondary | ICD-10-CM

## 2021-04-20 DIAGNOSIS — R7989 Other specified abnormal findings of blood chemistry: Secondary | ICD-10-CM

## 2021-04-20 DIAGNOSIS — O09899 Supervision of other high risk pregnancies, unspecified trimester: Secondary | ICD-10-CM

## 2021-04-20 DIAGNOSIS — G40909 Epilepsy, unspecified, not intractable, without status epilepticus: Secondary | ICD-10-CM

## 2021-04-20 DIAGNOSIS — O99351 Diseases of the nervous system complicating pregnancy, first trimester: Secondary | ICD-10-CM

## 2021-04-20 DIAGNOSIS — Z8759 Personal history of other complications of pregnancy, childbirth and the puerperium: Secondary | ICD-10-CM

## 2021-04-20 DIAGNOSIS — O99512 Diseases of the respiratory system complicating pregnancy, second trimester: Secondary | ICD-10-CM

## 2021-04-20 DIAGNOSIS — G44229 Chronic tension-type headache, not intractable: Secondary | ICD-10-CM

## 2021-04-20 DIAGNOSIS — F32A Depression, unspecified: Secondary | ICD-10-CM

## 2021-04-20 DIAGNOSIS — J45909 Unspecified asthma, uncomplicated: Secondary | ICD-10-CM

## 2021-04-20 NOTE — Patient Instructions (Signed)

## 2021-04-20 NOTE — Progress Notes (Signed)
Patient received flu vaccine, proof of vacination printed and given to patient to turn into work. Patient is Producer, television/film/video. Would like to speak to provide regarding [place] results. States that she was told there was a "white spot on babys heart"   Wynona Canes, CMA

## 2021-04-20 NOTE — Progress Notes (Signed)
   Subjective:  Nkechi Linehan is a 35 y.o. [id number] at [redacted]w[redacted]d being seen today for ongoing prenatal care.  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  Patient reports no complaints.  Contractions: Not present. Vag. Bleeding: None.  Movement: Present. Denies leaking of fluid.   The following portions of the patient's history were reviewed and updated as appropriate: allergies, current medications, past family history, past medical history, past social history, past surgical history and problem list. Problem list updated.  Objective:   Vitals:   04/20/21 0903  BP: 111/72  Pulse: (!) 116  Weight: 155 lb (70.3 kg)    Fetal Status: Fetal Heart Rate (bpm): 143   Movement: Present     General:  Alert, oriented and cooperative. Patient is in no acute distress.  Skin: Skin is warm and dry. No rash noted.   Cardiovascular: Normal heart rate noted  Respiratory: Normal respiratory effort, no problems with respiration noted  Abdomen: Soft, gravid, appropriate for gestational age. Pain/Pressure: Absent     Pelvic: Vag. Bleeding: None     Cervical exam deferred        Extremities: Normal range of motion.  Edema: None  Mental Status: Normal mood and affect. Normal behavior. Normal judgment and thought content.   Urinalysis:      Assessment and Plan:  Pregnancy: [id number] at [redacted]w[redacted]d  1. Supervision of high risk pregnancy, antepartum BP and FHR normal Accepts flu shot today - Flu Vaccine QUAD 67mo+IM (Fluarix, Fluzone & Alfiuria Quad PF)  2. 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 History of pre-eclampsia On ASA  4. History of reversal of tubal ligation   5. History of preterm delivery, currently pregnant At [redacted] weeks  6. Low TSH level Free T4 normal last check  7. Anxiety and depression stable  8. Asthma affecting pregnancy in second trimester   9. Chronic tension-type headache, not intractable Much improved  Preterm labor symptoms and general obstetric precautions including but not limited to vaginal bleeding, contractions, leaking of fluid and fetal movement were reviewed in detail with the patient. Please refer to After Visit Summary for other counseling recommendations.  Return in 4 weeks (on 05/18/2021) for [facility], ob visit.   Venora Maples, MD

## 2021-05-13 ENCOUNTER — Encounter: Payer: Self-pay | Admitting: *Deleted

## 2021-05-13 ENCOUNTER — Ambulatory Visit: Payer: Medicaid Other | Admitting: *Deleted

## 2021-05-13 ENCOUNTER — Ambulatory Visit: Payer: Medicaid Other | Attending: Obstetrics

## 2021-05-13 ENCOUNTER — Other Ambulatory Visit: Payer: Self-pay

## 2021-05-13 VITALS — BP 99/69 | HR 105

## 2021-05-13 DIAGNOSIS — O09292 Supervision of pregnancy with other poor reproductive or obstetric history, second trimester: Secondary | ICD-10-CM

## 2021-05-13 DIAGNOSIS — O0990 Supervision of high risk pregnancy, unspecified, unspecified trimester: Secondary | ICD-10-CM

## 2021-05-13 DIAGNOSIS — Z362 Encounter for other antenatal screening follow-up: Secondary | ICD-10-CM | POA: Insufficient documentation

## 2021-05-13 DIAGNOSIS — O358XX0 Maternal care for other (suspected) fetal abnormality and damage, not applicable or unspecified: Secondary | ICD-10-CM

## 2021-05-13 DIAGNOSIS — Z3A23 23 weeks gestation of pregnancy: Secondary | ICD-10-CM

## 2021-05-15 ENCOUNTER — Encounter (HOSPITAL_COMMUNITY): Payer: Self-pay

## 2021-05-15 ENCOUNTER — Ambulatory Visit (HOSPITAL_COMMUNITY)
Admission: EM | Admit: 2021-05-15 | Discharge: 2021-05-15 | Disposition: A | Discharge status: Home / Self Care | Payer: Medicaid Other | Attending: Family Medicine | Admitting: Family Medicine

## 2021-05-15 DIAGNOSIS — J029 Acute pharyngitis, unspecified: Secondary | ICD-10-CM | POA: Insufficient documentation

## 2021-05-15 DIAGNOSIS — J069 Acute upper respiratory infection, unspecified: Secondary | ICD-10-CM | POA: Diagnosis not present

## 2021-05-15 LAB — RESPIRATORY PANEL BY PCR

## 2021-05-15 LAB — POCT RAPID STREP A, ED / UC: Streptococcus, Group A Screen (Direct): NEGATIVE

## 2021-05-15 MED ORDER — LIDOCAINE VISCOUS HCL 2 % MT SOLN: 5.0000 mL | OROMUCOSAL | 0 refills | Status: DC | PRN

## 2021-05-15 MED ORDER — FLUTICASONE PROPIONATE 50 MCG/ACT NA SUSP: 1.0000 | Freq: Two times a day (BID) | NASAL | 2 refills | Status: DC

## 2021-05-15 MED ORDER — PSEUDOEPHEDRINE HCL 30 MG PO TABS: 30.0000 mg | ORAL_TABLET | ORAL | 0 refills | Status: DC | PRN

## 2021-05-15 NOTE — ED Triage Notes (Signed)
Pt presents with a sore throat x 1 week. Pt states she lost her voice 3 days ago. States she haas R ear pain.

## 2021-05-19 NOTE — ED Provider Notes (Signed)
[facility]    CSN: [id number] Arrival date & time: 05/15/21  1633      History   Chief Complaint Chief Complaint  Patient presents with   Sore Throat    HPI Nicole Gallegos is a 35 y.o. female.   Here today with about a week of sore throat, hoarseness, and now also right ear pain. Denies fever, chills, CP, SOB, abdominal pain, N/V/D. Hx of asthma and allergic rhinitis on prn allergy regimen and albuterol inhaler. Multiple sick contacts recently.    Past Medical History:  Diagnosis Date   Asthma    Exercise induced;inhaler prn   Constipation    Depression    No meds. currently   History of blood transfusion    Accident at 7 yoa and lost a lot of blood   Migraine    Panic attacks    No meds currently;used to take meds in 2009   Preterm labor 07/11/2008   2nd child;had to stop labor 2 times;due to stress   Scoliosis    Seizures ([place])    if gets overheated;no meds, last seizure 6 yrs ago    Patient Active Problem List   Diagnosis Date Noted   Low TSH level 03/04/2021   History of reversal of tubal ligation 03/02/2021   History of preterm delivery, currently pregnant 03/02/2021   Supervision of high risk pregnancy, antepartum 02/26/2021   History of pre-eclampsia 07/10/2016   Tension headache, chronic 04/21/2015   Migraine 04/21/2015   Anxiety and depression 09/24/2012   History of blood transfusion 09/24/2012   Asthma affecting pregnancy in second trimester 09/24/2012   Seizure disorder during pregnancy in first trimester ([place]) 09/24/2012    Past Surgical History:  Procedure Laterality Date   DILATION AND CURETTAGE OF UTERUS     Suction D&C to remove 16 wk. fetal demise   FOOT SURGERY     R foot, repair torn tissue and skin graft   SKIN GRAFT Right    Upper Thigh   TUBAL LIGATION Bilateral 07/02/2016   Procedure: POST PARTUM TUBAL LIGATION with filshie clips;  Surgeon: Osborne Oman, MD;  Location: [facility];  Service: Gynecology;  Laterality:  Bilateral;   TUBAL REVERSAL  09/2020   WISDOM TOOTH EXTRACTION  2008    OB History     Gravida  7   Para  4   Term  3   Preterm  1   AB  2   Living  4      SAB  1   IAB  1   Ectopic  0   Multiple  0   Live Births  4            Home Medications    Prior to Admission medications   Medication Sig Start Date End Date Taking? Authorizing Provider  fluticasone (FLONASE) 50 MCG/ACT nasal spray Place 1 spray into both nostrils 2 (two) times daily. 05/15/21  Yes Volney American, PA-C  lidocaine (XYLOCAINE) 2 % solution Use as directed 5 mLs in the mouth or throat as needed for mouth pain. 05/15/21  Yes Volney American, PA-C  pseudoephedrine (SUDAFED) 30 MG tablet Take 1 tablet (30 mg total) by mouth every 4 (four) hours as needed for congestion. 05/15/21  Yes Volney American, PA-C  albuterol (VENTOLIN HFA) 108 (90 Base) MCG/ACT inhaler INHALE 2 PUFFS BY MOUTH 4 TIMES A DAY DAILY 09/07/20 09/07/21  Trey Sailors, PA  aspirin EC 81 MG tablet Take  1 tablet (81 mg total) by mouth daily. 02/26/21   Aletha Halim, MD  Blood Pressure Monitoring (BLOOD PRESSURE KIT) DEVI 1 Device by Does not apply route as needed. 02/26/21   Aletha Halim, MD  cyclobenzaprine (FLEXERIL) 5 MG tablet Take 1 tablet (5 mg total) by mouth 3 (three) times daily as needed for muscle spasms. Patient not taking: Reported on 05/13/2021 02/19/21   Seabron Spates, CNM  fluticasone (FLOVENT HFA) 110 MCG/ACT inhaler Inhale 1 puff into the lungs in the morning and at bedtime. 02/26/21   Aletha Halim, MD  Misc. Devices (GOJJI WEIGHT SCALE) MISC 1 Device by Does not apply route as needed. 02/26/21   Aletha Halim, MD  Prenatal Vit-Fe Fumarate-FA (MULTIVITAMIN-PRENATAL) 27-0.8 MG TABS tablet Take 1 tablet by mouth daily at 12 noon.    [provider]    Family History Family History  Problem Relation Age of Onset   Diabetes Mother    COPD Father    Seizures Father         if gets overheated   Seizures Daughter        if gets overheated   Seizures Daughter        if gets overheated   Other Daughter        1st child had 2 vein umbilical cord; Pt did weekly NSTs.   Heart disease Maternal Aunt        Hole in the heart   Heart disease Maternal Uncle        Hole in the heart   Hypertension Maternal Uncle    Hypertension Paternal Uncle    Heart attack Maternal Grandmother        Deceased   Hypertension Maternal Grandmother    Diabetes Paternal Grandmother    Bipolar disorder Cousin        maternal   Depression Cousin        maternal   Anesthesia problems Neg Hx     Social History Social History   Tobacco Use   Smoking status: Never   Smokeless tobacco: Never  Vaping Use   Vaping Use: Never used  Substance Use Topics   Alcohol use: Not Currently    Comment: occasionally   Drug use: No     Allergies   Chocolate   Review of Systems Review of Systems PER HPI  Physical Exam Triage Vital Signs ED Triage Vitals  Enc Vitals Group     BP 05/15/21 1738 116/76     Pulse Rate 05/15/21 1738 (!) 118     Resp 05/15/21 1738 19     Temp 05/15/21 1738 98.9 F (37.2 C)     Temp Source 05/15/21 1738 Oral     SpO2 05/15/21 1738 97 %     Weight --      Height --      Head Circumference --      Peak Flow --      Pain Score 05/15/21 1737 10     Pain Loc --      Pain Edu? --      Excl. in [place]? --    No data found.  Updated Vital Signs BP 116/76 (BP Location: Right Arm)   Pulse (!) 118   Temp 98.9 F (37.2 C) (Oral)   Resp 19   LMP 12/01/2020 (Exact Date)   SpO2 97%   Visual Acuity Right Eye Distance:   Left Eye Distance:   Bilateral Distance:    Right Eye Near:  Left Eye Near:    Bilateral Near:     Physical Exam Vitals and nursing note reviewed.  Constitutional:      Appearance: Normal appearance. She is not ill-appearing.  HENT:     Head: Atraumatic.     Right Ear: Tympanic membrane normal.     Left Ear: Tympanic  membrane normal.     Nose: Rhinorrhea present.     Mouth/Throat:     Mouth: Mucous membranes are moist.     Pharynx: Posterior oropharyngeal erythema present. No oropharyngeal exudate.  Eyes:     Extraocular Movements: Extraocular movements intact.     Conjunctiva/sclera: Conjunctivae normal.  Cardiovascular:     Rate and Rhythm: Normal rate and regular rhythm.     Heart sounds: Normal heart sounds.  Pulmonary:     Effort: Pulmonary effort is normal.     Breath sounds: Normal breath sounds. No wheezing or rales.  Musculoskeletal:        General: Normal range of motion.     Cervical back: Normal range of motion and neck supple.  Skin:    General: Skin is warm and dry.  Neurological:     Mental Status: She is alert and oriented to person, place, and time.  Psychiatric:        Mood and Affect: Mood normal.        Thought Content: Thought content normal.        Judgment: Judgment normal.     UC Treatments / Results  Labs (all labs ordered are listed, but only abnormal results are displayed) Labs Reviewed  RESPIRATORY PANEL BY PCR  POCT RAPID STREP A, ED / UC    EKG   Radiology No results found.  Procedures Procedures (including critical care time)  Medications Ordered in UC Medications - No data to display  Initial Impression / Assessment and Plan / UC Course  I have reviewed the triage vital signs and the nursing notes.  Pertinent labs & imaging results that were available during my care of the patient were reviewed by me and considered in my medical decision making (see chart for details).     Vitals and exam reassuring, rapid strep neg, resp panel pending. Treat symptomatically with sudafed, flonase and viscous lidocaine. Work note given, return for worsening sxs.   Final Clinical Impressions(s) / UC Diagnoses   Final diagnoses:  Acute pharyngitis, unspecified etiology  Viral URI with cough   Discharge Instructions   None    ED Prescriptions      Medication Sig Dispense Auth. Provider   fluticasone (FLONASE) 50 MCG/ACT nasal spray Place 1 spray into both nostrils 2 (two) times daily. 16 g Volney American, [place]   pseudoephedrine (SUDAFED) 30 MG tablet Take 1 tablet (30 mg total) by mouth every 4 (four) hours as needed for congestion. 15 tablet Volney American, [place]   lidocaine (XYLOCAINE) 2 % solution Use as directed 5 mLs in the mouth or throat as needed for mouth pain. 100 mL Volney American, [place]      PDMP not reviewed this encounter.   Volney American, [place] 05/19/21 2242

## 2021-05-24 ENCOUNTER — Institutional Professional Consult (permissible substitution): Payer: 59 | Admitting: Neurology

## 2021-05-25 ENCOUNTER — Encounter: Payer: Self-pay | Admitting: Neurology

## 2021-05-25 ENCOUNTER — Encounter: Payer: Medicaid Other | Admitting: Family Medicine

## 2021-05-26 ENCOUNTER — Telehealth: Payer: Self-pay

## 2021-05-26 NOTE — Telephone Encounter (Signed)
Called patient to notified of Neurology appt scheduled on 08/02/21 at 10:30am.  Will mail a reminder letter of appt.

## 2021-05-27 ENCOUNTER — Other Ambulatory Visit: Payer: Self-pay

## 2021-05-27 ENCOUNTER — Encounter: Payer: Self-pay | Admitting: Obstetrics and Gynecology

## 2021-05-27 ENCOUNTER — Ambulatory Visit (INDEPENDENT_AMBULATORY_CARE_PROVIDER_SITE_OTHER): Payer: Medicaid Other | Admitting: Obstetrics and Gynecology

## 2021-05-27 VITALS — BP 114/74 | HR 96 | Wt 155.7 lb

## 2021-05-27 DIAGNOSIS — O09899 Supervision of other high risk pregnancies, unspecified trimester: Secondary | ICD-10-CM

## 2021-05-27 DIAGNOSIS — O99351 Diseases of the nervous system complicating pregnancy, first trimester: Secondary | ICD-10-CM

## 2021-05-27 DIAGNOSIS — Z8759 Personal history of other complications of pregnancy, childbirth and the puerperium: Secondary | ICD-10-CM

## 2021-05-27 DIAGNOSIS — G40909 Epilepsy, unspecified, not intractable, without status epilepticus: Secondary | ICD-10-CM

## 2021-05-27 DIAGNOSIS — O0990 Supervision of high risk pregnancy, unspecified, unspecified trimester: Secondary | ICD-10-CM

## 2021-05-27 NOTE — Progress Notes (Signed)
   PRENATAL VISIT NOTE  Subjective:  Nicole Gallegos is a 35 y.o. [id number] at [redacted]w[redacted]d being seen today for ongoing prenatal care.  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  Patient reports no complaints.  Contractions: Not present. Vag. Bleeding: None.  Movement: Present. Denies leaking of fluid.   The following portions of the patient's history were reviewed and updated as appropriate: allergies, current medications, past family history, past medical history, past social history, past surgical history and problem list.   Objective:   Vitals:   05/27/21 1600  BP: 114/74  Pulse: 96  Weight: 155 lb 11.2 oz (70.6 kg)    Fetal Status: Fetal Heart Rate (bpm): 156 Fundal Height: 25 cm Movement: Present     General:  Alert, oriented and cooperative. Patient is in no acute distress.  Skin: Skin is warm and dry. No rash noted.   Cardiovascular: Normal heart rate noted  Respiratory: Normal respiratory effort, no problems with respiration noted  Abdomen: Soft, gravid, appropriate for gestational age.  Pain/Pressure: Present     Pelvic: Cervical exam deferred        Extremities: Normal range of motion.  Edema: None  Mental Status: Normal mood and affect. Normal behavior. Normal judgment and thought content.   Assessment and Plan:  Pregnancy: [id number] at [redacted]w[redacted]d 1. Supervision of high risk pregnancy, antepartum Patient is doing well without complaints Third trimester labs with glucola next visit  2. Seizure disorder during pregnancy in first trimester  [facility]) No further episodes since her first trimester Patient scheduled to see a neurologist for seizure and headaches  3. History of pre-eclampsia Asymptomatic and normotensive Patient very anxious about experiencing it again Continue ASA  4. History of preterm delivery, currently pregnant   Preterm labor symptoms and general obstetric precautions including but not limited to vaginal bleeding, contractions, leaking of fluid and fetal movement were reviewed in detail with the patient. Please refer to After Visit Summary for other counseling recommendations.   Return in about 4 weeks (around 06/24/2021) for in person, ROB, High risk, 2 hr glucola next visit.  Future Appointments  Date Time Provider Department Center  06/24/2021  8:20 AM [facility]  06/24/2021  9:15 AM [place] Bing, MD [facility]  08/02/2021 10:30 AM [place] Fret, MD [facility] None    Catalina Antigua, MD

## 2021-05-31 ENCOUNTER — Inpatient Hospital Stay (HOSPITAL_COMMUNITY)
Admission: AD | Admit: 2021-05-31 | Discharge: 2021-06-01 | DRG: 832 | Disposition: A | Discharge status: Home / Self Care | Payer: Medicaid Other | Attending: Family Medicine | Admitting: Family Medicine

## 2021-05-31 ENCOUNTER — Inpatient Hospital Stay (HOSPITAL_COMMUNITY): Payer: Medicaid Other

## 2021-05-31 ENCOUNTER — Encounter (HOSPITAL_COMMUNITY): Payer: Self-pay | Admitting: Obstetrics & Gynecology

## 2021-05-31 ENCOUNTER — Inpatient Hospital Stay (HOSPITAL_BASED_OUTPATIENT_CLINIC_OR_DEPARTMENT_OTHER): Payer: Medicaid Other

## 2021-05-31 ENCOUNTER — Other Ambulatory Visit: Payer: Self-pay

## 2021-05-31 DIAGNOSIS — W19XXXA Unspecified fall, initial encounter: Secondary | ICD-10-CM | POA: Diagnosis present

## 2021-05-31 DIAGNOSIS — S069X9A Unspecified intracranial injury with loss of consciousness of unspecified duration, initial encounter: Secondary | ICD-10-CM | POA: Diagnosis present

## 2021-05-31 DIAGNOSIS — R55 Syncope and collapse: Secondary | ICD-10-CM | POA: Diagnosis present

## 2021-05-31 DIAGNOSIS — Z3A25 25 weeks gestation of pregnancy: Secondary | ICD-10-CM

## 2021-05-31 DIAGNOSIS — Z8759 Personal history of other complications of pregnancy, childbirth and the puerperium: Secondary | ICD-10-CM | POA: Diagnosis not present

## 2021-05-31 DIAGNOSIS — O26892 Other specified pregnancy related conditions, second trimester: Secondary | ICD-10-CM | POA: Diagnosis present

## 2021-05-31 DIAGNOSIS — O09292 Supervision of pregnancy with other poor reproductive or obstetric history, second trimester: Secondary | ICD-10-CM

## 2021-05-31 DIAGNOSIS — Z3A26 26 weeks gestation of pregnancy: Secondary | ICD-10-CM

## 2021-05-31 DIAGNOSIS — O0990 Supervision of high risk pregnancy, unspecified, unspecified trimester: Secondary | ICD-10-CM | POA: Diagnosis not present

## 2021-05-31 DIAGNOSIS — O9A212 Injury, poisoning and certain other consequences of external causes complicating pregnancy, second trimester: Secondary | ICD-10-CM

## 2021-05-31 DIAGNOSIS — O09212 Supervision of pregnancy with history of pre-term labor, second trimester: Secondary | ICD-10-CM | POA: Diagnosis not present

## 2021-05-31 DIAGNOSIS — Z20822 Contact with and (suspected) exposure to covid-19: Secondary | ICD-10-CM | POA: Diagnosis present

## 2021-05-31 DIAGNOSIS — S3991XA Unspecified injury of abdomen, initial encounter: Secondary | ICD-10-CM

## 2021-05-31 DIAGNOSIS — Z8669 Personal history of other diseases of the nervous system and sense organs: Secondary | ICD-10-CM

## 2021-05-31 DIAGNOSIS — R109 Unspecified abdominal pain: Secondary | ICD-10-CM | POA: Diagnosis present

## 2021-05-31 DIAGNOSIS — O26899 Other specified pregnancy related conditions, unspecified trimester: Secondary | ICD-10-CM | POA: Diagnosis not present

## 2021-05-31 DIAGNOSIS — O0992 Supervision of high risk pregnancy, unspecified, second trimester: Secondary | ICD-10-CM | POA: Diagnosis not present

## 2021-05-31 DIAGNOSIS — R569 Unspecified convulsions: Secondary | ICD-10-CM | POA: Diagnosis not present

## 2021-05-31 DIAGNOSIS — O99352 Diseases of the nervous system complicating pregnancy, second trimester: Secondary | ICD-10-CM | POA: Diagnosis present

## 2021-05-31 DIAGNOSIS — G40909 Epilepsy, unspecified, not intractable, without status epilepticus: Secondary | ICD-10-CM | POA: Diagnosis present

## 2021-05-31 DIAGNOSIS — W19XXXS Unspecified fall, sequela: Secondary | ICD-10-CM | POA: Diagnosis not present

## 2021-05-31 LAB — RESP PANEL BY RT-PCR (FLU A&B, COVID) ARPGX2
Influenza A by PCR: NEGATIVE
Influenza B by PCR: NEGATIVE
SARS Coronavirus 2 by RT PCR: NEGATIVE

## 2021-05-31 LAB — CBC
HCT: 34.2 % — ABNORMAL LOW (ref 36.0–46.0)
Hemoglobin: 10.8 g/dL — ABNORMAL LOW (ref 12.0–15.0)
MCH: 27.6 pg (ref 26.0–34.0)
MCHC: 31.6 g/dL (ref 30.0–36.0)
MCV: 87.5 fL (ref 80.0–100.0)
Platelets: 302 10*3/uL (ref 150–400)
RBC: 3.91 MIL/uL (ref 3.87–5.11)
RDW: 13.7 % (ref 11.5–15.5)
WBC: 8.2 10*3/uL (ref 4.0–10.5)
nRBC: 0.2 % (ref 0.0–0.2)

## 2021-05-31 LAB — PROTEIN / CREATININE RATIO, URINE
Creatinine, Urine: 112.99 mg/dL
Protein Creatinine Ratio: 0.11 mg/mg{Cre} (ref 0.00–0.15)
Total Protein, Urine: 12 mg/dL

## 2021-05-31 LAB — COMPREHENSIVE METABOLIC PANEL
ALT: 9 U/L (ref 0–44)
AST: 15 U/L (ref 15–41)
Albumin: 2.6 g/dL — ABNORMAL LOW (ref 3.5–5.0)
Alkaline Phosphatase: 63 U/L (ref 38–126)
Anion gap: 11 (ref 5–15)
BUN: 5 mg/dL — ABNORMAL LOW (ref 6–20)
CO2: 21 mmol/L — ABNORMAL LOW (ref 22–32)
Calcium: 8.7 mg/dL — ABNORMAL LOW (ref 8.9–10.3)
Chloride: 104 mmol/L (ref 98–111)
Creatinine, Ser: 0.76 mg/dL (ref 0.44–1.00)
GFR, Estimated: >60 mL/min (ref >60)
Glucose, Bld: 94 mg/dL (ref 70–99)
Potassium: 3.8 mmol/L (ref 3.5–5.1)
Sodium: 136 mmol/L (ref 135–145)
Total Bilirubin: 0.6 mg/dL (ref 0.3–1.2)
Total Protein: 5.9 g/dL — ABNORMAL LOW (ref 6.5–8.1)

## 2021-05-31 LAB — RAPID URINE DRUG SCREEN, HOSP PERFORMED
Amphetamines: NOT DETECTED
Barbiturates: NOT DETECTED
Benzodiazepines: NOT DETECTED
Cocaine: NOT DETECTED
Opiates: NOT DETECTED
Tetrahydrocannabinol: NOT DETECTED

## 2021-05-31 LAB — ECHOCARDIOGRAM COMPLETE
AV Mean grad: 7 mmHg
AV Peak grad: 14 mmHg
Ao pk vel: 1.87 m/s
Height: 67 in
S' Lateral: 3.3 cm
Single Plane A4C EF: 57.8 %
Weight: 2490.32 oz

## 2021-05-31 LAB — GLUCOSE, CAPILLARY: Glucose-Capillary: 84 mg/dL (ref 70–99)

## 2021-05-31 MED ORDER — ASPIRIN EC 81 MG PO TBEC
81.0000 mg | DELAYED_RELEASE_TABLET | Freq: Every day | ORAL | Status: DC
  Administered 2021-05-31 – 2021-06-01 (×2): 81 mg via ORAL
  Filled 2021-05-31 (×2): qty 1

## 2021-05-31 MED ORDER — LACTATED RINGERS IV SOLN
INTRAVENOUS | Status: DC
Start: 2021-05-31 — End: 2021-06-01

## 2021-05-31 MED ORDER — FLUTICASONE PROPIONATE HFA 110 MCG/ACT IN AERO: 1.0000 | INHALATION_SPRAY | Freq: Two times a day (BID) | RESPIRATORY_TRACT | Status: DC

## 2021-05-31 MED ORDER — LACTATED RINGERS IV BOLUS
1000.0000 mL | Freq: Once | INTRAVENOUS | Status: AC
  Administered 2021-05-31: 1000 mL via INTRAVENOUS

## 2021-05-31 MED ORDER — LACTATED RINGERS IV SOLN: INTRAVENOUS | Status: DC

## 2021-05-31 MED ORDER — ALBUTEROL SULFATE (2.5 MG/3ML) 0.083% IN NEBU: 3.0000 mL | INHALATION_SOLUTION | RESPIRATORY_TRACT | Status: DC | PRN

## 2021-05-31 MED ORDER — ACETAMINOPHEN 500 MG PO TABS
1000.0000 mg | ORAL_TABLET | Freq: Once | ORAL | Status: AC
  Administered 2021-05-31: 1000 mg via ORAL
  Filled 2021-05-31: qty 2

## 2021-05-31 MED ORDER — CYCLOBENZAPRINE HCL 10 MG PO TABS
5.0000 mg | ORAL_TABLET | Freq: Three times a day (TID) | ORAL | Status: DC | PRN
  Administered 2021-05-31 (×2): 5 mg via ORAL
  Filled 2021-05-31 (×2): qty 1

## 2021-05-31 MED ORDER — BETAMETHASONE SOD PHOS & ACET 6 (3-3) MG/ML IJ SUSP
12.0000 mg | INTRAMUSCULAR | Status: AC
  Administered 2021-05-31 – 2021-06-01 (×2): 12 mg via INTRAMUSCULAR
  Filled 2021-05-31: qty 5

## 2021-05-31 MED ORDER — BUDESONIDE 0.25 MG/2ML IN SUSP
0.2500 mg | Freq: Two times a day (BID) | RESPIRATORY_TRACT | Status: DC
  Administered 2021-05-31 – 2021-06-01 (×2): 0.25 mg via RESPIRATORY_TRACT
  Filled 2021-05-31 (×4): qty 2
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
## 2021-05-31 NOTE — Progress Notes (Signed)
RROB called to assist with patient in MAU. Pt alert and oriented x 3. Reports pain in head and abdomen. Abdomen tender to touch over right upper quad and fundus. Pulse 150. FHR reassuring. Ultrasound +FHR . Pt awaiting CT and Neuro. M bhambri CNM at bedside.

## 2021-05-31 NOTE — Progress Notes (Signed)
  Echocardiogram 2D Echocardiogram has been performed.  Nicole Gallegos F 05/31/2021, 2:58 PM

## 2021-05-31 NOTE — MAU Provider Note (Addendum)
History     CSN: [id number]  Arrival date and time: 05/31/21 0707  35 y.o. [id number] _0 .6 wks presenting via stretcher from main elevators d/t witnessed fall and syncope. Witness reports she was in the elevator when she started to fall hitting her head on one side then the front before falling down. She was then brought to MAU via stretcher. Upon interview she was disoriented and unaware of person or place. After a few minutes she could state her name but was unaware of place. Within a few more minutes she was able to state her full name and where she worked at [employer]. She reports taking her son to school this am but cannot recall anything after that. After a few more minutes she remembered being on the elevator. Reports hx of seizures in previous pregnancy d/t postpartum PEC. Reports falling and hitting her head at that time. Also reports hx of a seizure when she was age 88 then not again until she was 89. She is not on seizure meds.   OB History     Gravida  7   Para  4   Term  3   Preterm  1   AB  2   Living  4      SAB  1   IAB  1   Ectopic  0   Multiple  0   Live Births  4           Past Medical History:  Diagnosis Date   Asthma    Exercise induced;inhaler prn   Constipation    Depression    No meds. currently   History of blood transfusion    Accident at 7 yoa and lost a lot of blood   Migraine    Panic attacks    No meds currently;used to take meds in 2009   Preterm labor 07/11/2008   2nd child;had to stop labor 2 times;due to stress   Scoliosis    Seizures ([place])    if gets overheated;no meds, last seizure 6 yrs ago    Past Surgical History:  Procedure Laterality Date   DILATION AND CURETTAGE OF UTERUS     Suction D&C to remove 16 wk. fetal demise   FOOT SURGERY     R foot, repair torn tissue and skin graft   SKIN GRAFT Right    Upper Thigh   TUBAL LIGATION Bilateral 07/02/2016   Procedure: POST PARTUM TUBAL LIGATION with filshie clips;   Surgeon: Osborne Oman, MD;  Location: [facility];  Service: Gynecology;  Laterality: Bilateral;   TUBAL REVERSAL  09/2020   WISDOM TOOTH EXTRACTION  2008    Family History  Problem Relation Age of Onset   Diabetes Mother    COPD Father    Seizures Father        if gets overheated   Seizures Daughter        if gets overheated   Seizures Daughter        if gets overheated   Other Daughter        1st child had 2 vein umbilical cord; Pt did weekly NSTs.   Heart disease Maternal Aunt        Hole in the heart   Heart disease Maternal Uncle        Hole in the heart   Hypertension Maternal Uncle    Hypertension Paternal Uncle    Heart attack Maternal Grandmother        Deceased  Hypertension Maternal Grandmother    Diabetes Paternal Grandmother    Bipolar disorder Cousin        maternal   Depression Cousin        maternal   Anesthesia problems Neg Hx     Social History   Tobacco Use   Smoking status: Never   Smokeless tobacco: Never  Vaping Use   Vaping Use: Never used  Substance Use Topics   Alcohol use: Not Currently    Comment: occasionally   Drug use: No    Allergies:  Allergies  Allergen Reactions   Chocolate Hives    Medications Prior to Admission  Medication Sig Dispense Refill Last Dose   albuterol (VENTOLIN HFA) 108 (90 Base) MCG/ACT inhaler INHALE 2 PUFFS BY MOUTH 4 TIMES A DAY DAILY 8.5 g 5    aspirin EC 81 MG tablet Take 1 tablet (81 mg total) by mouth daily. 60 tablet 2    Blood Pressure Monitoring (BLOOD PRESSURE KIT) DEVI 1 Device by Does not apply route as needed. 1 each 0    cyclobenzaprine (FLEXERIL) 5 MG tablet Take 1 tablet (5 mg total) by mouth 3 (three) times daily as needed for muscle spasms. (Patient not taking: Reported on 05/13/2021) 30 tablet 0    fluticasone (FLONASE) 50 MCG/ACT nasal spray Place 1 spray into both nostrils 2 (two) times daily. 16 g 2    fluticasone (FLOVENT HFA) 110 MCG/ACT inhaler Inhale 1 puff into the lungs in the  morning and at bedtime. 1 each 12    lidocaine (XYLOCAINE) 2 % solution Use as directed 5 mLs in the mouth or throat as needed for mouth pain. (Patient not taking: Reported on 05/27/2021) 100 mL 0    Misc. Devices (GOJJI WEIGHT SCALE) MISC 1 Device by Does not apply route as needed. 1 each 0    Prenatal Vit-Fe Fumarate-FA (MULTIVITAMIN-PRENATAL) 27-0.8 MG TABS tablet Take 1 tablet by mouth daily at 12 noon.      pseudoephedrine (SUDAFED) 30 MG tablet Take 1 tablet (30 mg total) by mouth every 4 (four) hours as needed for congestion. 15 tablet 0     Review of Systems  Gastrointestinal:  Positive for abdominal pain.  Neurological:  Positive for headaches.  Physical Exam   Blood pressure 121/78, pulse 95, last menstrual period 12/01/2020, SpO2 100 %.  Physical Exam Vitals and nursing note reviewed.  Eyes:     Pupils: Pupils are equal, round, and reactive to light.  Cardiovascular:     Rate and Rhythm: Tachycardia present.  Pulmonary:     Effort: No respiratory distress.  Musculoskeletal:        General: Normal range of motion.  Skin:    General: Skin is warm and dry.  Neurological:     Mental Status: She is lethargic and disoriented.     Cranial Nerves: No facial asymmetry.     Deep Tendon Reflexes: Reflexes normal.  Psychiatric:        Speech: Speech is delayed.        Behavior: Behavior is cooperative.        Cognition and Memory: Cognition is impaired. She exhibits impaired recent memory.  EFM: 150 bpm, mod variability, no accels, no decels Toco: none  Results for orders placed or performed during the hospital encounter of 05/31/21 (from the past 24 hour(s))  Glucose, capillary     Status: None   Collection Time: 05/31/21  7:11 AM  Result Value Ref Range   Glucose-Capillary 84  70 - 99 mg/dL  CBC     Status: Abnormal   Collection Time: 05/31/21  7:15 AM  Result Value Ref Range   WBC 8.2 4.0 - 10.5 K/uL   RBC 3.91 3.87 - 5.11 MIL/uL   Hemoglobin 10.8 (L) 12.0 - 15.0 g/dL    HCT 34.2 (L) 36.0 - 46.0 %   MCV 87.5 80.0 - 100.0 fL   MCH 27.6 26.0 - 34.0 pg   MCHC 31.6 30.0 - 36.0 g/dL   RDW 13.7 11.5 - 15.5 %   Platelets 302 150 - 400 K/uL   nRBC 0.2 0.0 - 0.2 %   MAU Course  Procedures  MDM Consult with Dr. Nelda Marseille at bedside. Plan for Neuro consult and imaging.  9774: Pt now reports intermittent abdominal pain, abdomen soft but tender, no ctx on toco, plan for bedside US to eval for abruption. Report and transfer of care given to Dr. Nehemiah Settle.  Julianne Handler, CNM  05/31/2021 8:02 AM   Assumed care of patient. Due to LOC and head trauma - patient still confused, will get head CT wo contrast. Will also get EKG. Labs normal, BP has remained normal - less likely eclampsia.  CT head negative.   EKG - Personally reviewed and interpreted: sinus rhythm, no acute ST changes.  Waiting to hear back from neurology.  Imaging: I independent reviewed the images of the ultrasound. Baby positioned vtx. CL 4.79cm. Anterior placenta. No evidence of abruption.    NST:  Baseline: 140  Variability: moderate Accelerations: none  Decelerations: variable decels Contractions: q3-4  Due to patient having contractions - cervix checked. Dilation: Closed Exam by:: Dr. Nehemiah Settle   Assessment and Plan   1. Supervision of high risk pregnancy, antepartum   2. Fall   3. Abdominal pain affecting pregnancy   4. [redacted] weeks gestation of pregnancy   5. History of eclampsia   6. Syncope, unspecified syncope type   7. Abdominal trauma, initial encounter    I discussed patient with Dr Quinn Axe with neurology. Agreed on not starting antieptileptics at this time. Will admit to Antenatal service. They will consult on the patient and get EEG.   Will check echocardiogram.  Repeat labs in AM.  BMZ 80m q24hours x2. Continuous monitoring for now Continue IVF.  JTruett Mainland DO 05/31/2021 10:32 AM

## 2021-05-31 NOTE — MAU Note (Signed)
At approximately 0708 emergency was called in main lobby. Patient was found in elevator unresponsive and had hit her head x2. Patient was moved to MAU accompanied by M. Denyse Amass, CNM. Patient now alert. Oriented to person and place. States she is having 10/10 pain in her head and her upper abdomen. Dr. Charlotta Newton called by CNM.

## 2021-05-31 NOTE — Progress Notes (Signed)
EEG complete - results pending 

## 2021-05-31 NOTE — MAU Note (Signed)
Dr. Charlotta Newton at bedside for further evaluation.

## 2021-05-31 NOTE — Progress Notes (Signed)
Patient unavailable for EEG due to ultrasound.  Will re-attempt at 2:30.

## 2021-05-31 NOTE — Procedures (Signed)
Patient Name: Nicole Gallegos  MRN: [id number]  Epilepsy Attending: Charlsie Quest  Referring Physician/Provider: Dr Bing Neighbors Date: 05/31/2021 Duration: 25.18 mins  Patient history: 35yo [id number] at [redacted]w[redacted]d with seizure disorder during pregnancy first trimester. EEG to evaluate for seizure.   Level of alertness: Awake, asleep  AEDs during EEG study: None  Technical aspects: This EEG study was done with scalp electrodes positioned according to the 10-20 International system of electrode placement. Electrical activity was acquired at a sampling rate of 500Hz  and reviewed with a high frequency filter of 70Hz  and a low frequency filter of 1Hz . EEG data were recorded continuously and digitally stored.   Description: The posterior dominant rhythm consists of 10 Hz activity of moderate voltage (25-35 uV) seen predominantly in posterior head regions, symmetric and reactive to eye opening and eye closing. Sleep was characterized by vertex waves, sleep spindles (12 to 14 Hz), maximal frontocentral region. Physiologic photic driving was seen during photic stimulation.  Hyperventilation was not performed.     IMPRESSION: This study is within normal limits. No seizures or epileptiform discharges were seen throughout the recording.  Sorcha Rotunno 

## 2021-06-01 ENCOUNTER — Inpatient Hospital Stay (HOSPITAL_COMMUNITY): Payer: Medicaid Other

## 2021-06-01 DIAGNOSIS — W19XXXA Unspecified fall, initial encounter: Secondary | ICD-10-CM | POA: Diagnosis present

## 2021-06-01 DIAGNOSIS — Z3A25 25 weeks gestation of pregnancy: Secondary | ICD-10-CM

## 2021-06-01 DIAGNOSIS — W19XXXS Unspecified fall, sequela: Secondary | ICD-10-CM

## 2021-06-01 DIAGNOSIS — R109 Unspecified abdominal pain: Secondary | ICD-10-CM | POA: Diagnosis present

## 2021-06-01 DIAGNOSIS — O26899 Other specified pregnancy related conditions, unspecified trimester: Secondary | ICD-10-CM | POA: Diagnosis present

## 2021-06-01 LAB — CBC
HCT: 31 % — ABNORMAL LOW (ref 36.0–46.0)
Hemoglobin: 9.6 g/dL — ABNORMAL LOW (ref 12.0–15.0)
MCH: 27 pg (ref 26.0–34.0)
MCHC: 31 g/dL (ref 30.0–36.0)
MCV: 87.3 fL (ref 80.0–100.0)
Platelets: 300 10*3/uL (ref 150–400)
RBC: 3.55 MIL/uL — ABNORMAL LOW (ref 3.87–5.11)
RDW: 13.4 % (ref 11.5–15.5)
WBC: 11.3 10*3/uL — ABNORMAL HIGH (ref 4.0–10.5)
nRBC: 0.2 % (ref 0.0–0.2)

## 2021-06-01 LAB — BASIC METABOLIC PANEL
Anion gap: 6 (ref 5–15)
BUN: 5 mg/dL — ABNORMAL LOW (ref 6–20)
CO2: 22 mmol/L (ref 22–32)
Calcium: 9 mg/dL (ref 8.9–10.3)
Chloride: 109 mmol/L (ref 98–111)
Creatinine, Ser: 0.6 mg/dL (ref 0.44–1.00)
GFR, Estimated: >60 mL/min (ref >60)
Glucose, Bld: 98 mg/dL (ref 70–99)
Potassium: 3.9 mmol/L (ref 3.5–5.1)
Sodium: 137 mmol/L (ref 135–145)

## 2021-06-01 LAB — GLUCOSE, CAPILLARY: Glucose-Capillary: 96 mg/dL (ref 70–99)

## 2021-06-01 MED ORDER — ACETAMINOPHEN 325 MG PO TABS
650.0000 mg | ORAL_TABLET | Freq: Four times a day (QID) | ORAL | Status: DC | PRN
  Administered 2021-06-01: 650 mg via ORAL
  Filled 2021-06-01: qty 2

## 2021-06-01 MED ORDER — ACETAMINOPHEN 500 MG PO TABS
1000.0000 mg | ORAL_TABLET | Freq: Once | ORAL | Status: AC
  Administered 2021-06-01: 1000 mg via ORAL
  Filled 2021-06-01: qty 2

## 2021-06-01 NOTE — Progress Notes (Signed)
Discharge instructions given to pt. Discussed signs and symptoms to report to the MD, upcoming appointments, and meds. Pt verbalizes understanding and has no questions or concerns at this time. Pt discharged home from hospital in stable condition. 

## 2021-06-01 NOTE — Discharge Summary (Signed)
Antenatal Physician Discharge Summary  Patient ID: Nicole Gallegos MRN: [id number] DOB/AGE: April 01, 1986 35 y.o.  Admit date: 05/31/2021 Discharge date: 06/01/2021  Admission Diagnoses: Fall, history of seizure disorder, [redacted] weeks gestation  Discharge Diagnoses: Likely syncope, the same as above  Prenatal Procedures: NST, TTE, EEG  Consults: Neurology  Hospital Course:  Katrinka Herbison is a 35 y.o. [id number] with IUP at 62w0dadmitted after fall where she hit her head and she had mental status changes, has history of seizure disorders. Neurology consulted. Workup with EKG unrevealing, CTH w/o contrast with no acute abnormalities. Orthostatic vitals obtained (immediately after she was given 2L LR bolus and put on maintenance IV fluids) was non revealing. Urine drug screen was non-revealing. Routine EEG with no epileptiform abnormalities, no seizures. TTE had EF of 60-65%, no abnormality to explain syncope.  MRI remarkable for incomplete bilateral hippocampal inversion with no other abnormality.  She had no further episodes during her overnight observation. From an obstetric standpoint, she had good fetal movement, no contractions, no leaking of fluid and no bleeding.  She received betamethasone regimen.  She was on continuous fetal heart rate monitoring that remained reassuring, and she had no signs/symptoms of any other maternal-fetal concerns.   She was deemed stable for discharge to home with outpatient follow up. As per Neurology recommendations (appreciate their input), [place] sync[place] up.   Discharge Exam: Temp:  [98 F (36.7 C)-98.3 F (36.8 C)] 98.3 F (36.8 C) (11/22 0740) Pulse Rate:  [83-113] 113 (11/22 0740) Resp:  [14-18] 18 (11/22 0740) BP: (98-115)/(53-69) 111/64 (11/22 0740) SpO2:  [95 %-100 %] 100 % (11/22 0830) Weight:  [72.9 kg] 72.9 kg (11/22 0556) Physical Examination: CONSTITUTIONAL:  Well-developed, well-nourished female in no acute distress.  HENT:  Normocephalic, atraumatic, External right and left ear normal.  EYES: Conjunctivae and EOM are normal. Pupils are equal, round, and reactive to light. No scleral icterus.  NECK: Normal range of motion, supple, no masses SKIN: Skin is warm and dry. No rash noted. Not diaphoretic. No erythema. No pallor. NEUROLOGIC: Alert and oriented to person, place, and time. Normal reflexes, muscle tone coordination. No cranial nerve deficit noted. PSYCHIATRIC: Normal mood and affect. Normal behavior. Normal judgment and thought content. CARDIOVASCULAR: Normal heart rate noted, regular rhythm RESPIRATORY: Effort and breath sounds normal, no problems with respiration noted MUSCULOSKELETAL: Normal range of motion. No edema and no tenderness. 2+ distal pulses. ABDOMEN: Soft, nontender, nondistended, gravid. CERVIX: Dilation: Closed Exam by:: Dr. SNehemiah Settle Fetal monitoring: FHR: 145 bpm, Variability: moderate, Accelerations: Present, Decelerations:small variables occasionally Uterine activity: rare  Significant Diagnostic Studies:  Results for orders placed or performed during the hospital encounter of 05/31/21 (from the past 168 hour(s))  Glucose, capillary   Collection Time: 05/31/21  7:11 AM  Result Value Ref Range   Glucose-Capillary 84 70 - 99 mg/dL  CBC   Collection Time: 05/31/21  7:15 AM  Result Value Ref Range   WBC 8.2 4.0 - 10.5 K/uL   RBC 3.91 3.87 - 5.11 MIL/uL   Hemoglobin 10.8 (L) 12.0 - 15.0 g/dL   HCT 34.2 (L) 36.0 - 46.0 %   MCV 87.5 80.0 - 100.0 fL   MCH 27.6 26.0 - 34.0 pg   MCHC 31.6 30.0 - 36.0 g/dL   RDW 13.7 11.5 - 15.5 %   Platelets 302 150 - 400 K/uL   nRBC 0.2 0.0 - 0.2 %  Comprehensive metabolic panel  Collection Time: 05/31/21  7:15 AM  Result Value Ref Range   Sodium 136 135 - 145 mmol/L   Potassium 3.8 3.5 - 5.1 mmol/L   Chloride 104 98 - 111 mmol/L   CO2 21 (L) 22 - 32 mmol/L   Glucose, Bld 94  70 - 99 mg/dL   BUN 5 (L) 6 - 20 mg/dL   Creatinine, Ser 0.76 0.44 - 1.00 mg/dL   Calcium 8.7 (L) 8.9 - 10.3 mg/dL   Total Protein 5.9 (L) 6.5 - 8.1 g/dL   Albumin 2.6 (L) 3.5 - 5.0 g/dL   AST 15 15 - 41 U/L   ALT 9 0 - 44 U/L   Alkaline Phosphatase 63 38 - 126 U/L   Total Bilirubin 0.6 0.3 - 1.2 mg/dL   GFR, Estimated >60 >60 mL/min   Anion gap 11 5 - 15  Protein / creatinine ratio, urine   Collection Time: 05/31/21  9:00 AM  Result Value Ref Range   Creatinine, Urine 112.99 mg/dL   Total Protein, Urine 12 mg/dL   Protein Creatinine Ratio 0.11 0.00 - 0.15 mg/mg[Cre]  Urine rapid drug screen (hosp performed)   Collection Time: 05/31/21  9:00 AM  Result Value Ref Range   Opiates NONE DETECTED NONE DETECTED   Cocaine NONE DETECTED NONE DETECTED   Benzodiazepines NONE DETECTED NONE DETECTED   Amphetamines NONE DETECTED NONE DETECTED   Tetrahydrocannabinol NONE DETECTED NONE DETECTED   Barbiturates NONE DETECTED NONE DETECTED  Resp Panel by RT-PCR (Flu A&B, Covid) Nasopharyngeal Swab   Collection Time: 05/31/21 10:05 AM   Specimen: Nasopharyngeal Swab; Nasopharyngeal(NP) swabs in vial transport medium  Result Value Ref Range   SARS Coronavirus 2 by RT PCR NEGATIVE NEGATIVE   Influenza A by PCR NEGATIVE NEGATIVE   Influenza B by PCR NEGATIVE NEGATIVE  ECHOCARDIOGRAM COMPLETE   Collection Time: 05/31/21  2:58 PM  Result Value Ref Range   Weight 2,490.32 oz   Height 67 in   BP 114/69 mmHg   Single Plane A4C EF 57.8 %   S' Lateral 3.30 cm   AV Mean grad 7.0 mmHg   AV Peak grad 14.0 mmHg   Ao pk vel 1.87 m/s  CBC   Collection Time: 06/01/21  5:04 AM  Result Value Ref Range   WBC 11.3 (H) 4.0 - 10.5 K/uL   RBC 3.55 (L) 3.87 - 5.11 MIL/uL   Hemoglobin 9.6 (L) 12.0 - 15.0 g/dL   HCT 31.0 (L) 36.0 - 46.0 %   MCV 87.3 80.0 - 100.0 fL   MCH 27.0 26.0 - 34.0 pg   MCHC 31.0 30.0 - 36.0 g/dL   RDW 13.4 11.5 - 15.5 %   Platelets 300 150 - 400 K/uL   nRBC 0.2 0.0 - 0.2 %  Basic  metabolic panel   Collection Time: 06/01/21  5:04 AM  Result Value Ref Range   Sodium 137 135 - 145 mmol/L   Potassium 3.9 3.5 - 5.1 mmol/L   Chloride 109 98 - 111 mmol/L   CO2 22 22 - 32 mmol/L   Glucose, Bld 98 70 - 99 mg/dL   BUN 5 (L) 6 - 20 mg/dL   Creatinine, Ser 0.60 0.44 - 1.00 mg/dL   Calcium 9.0 8.9 - 10.3 mg/dL   GFR, Estimated >60 >60 mL/min   Anion gap 6 5 - 15  Glucose, capillary   Collection Time: 06/01/21  5:56 AM  Result Value Ref Range   Glucose-Capillary 96 70 - 99  mg/dL   CT HEAD WO CONTRAST (5MM)  Result Date: 05/31/2021 CLINICAL DATA:  Head trauma EXAM: CT HEAD WITHOUT CONTRAST TECHNIQUE: Contiguous axial images were obtained from the base of the skull through the vertex without intravenous contrast. COMPARISON:  CT head dated July 10, 2016 FINDINGS: Brain: No evidence of acute infarction, hemorrhage, hydrocephalus, extra-axial collection or mass lesion/mass effect. Vascular: No hyperdense vessel or unexpected calcification. Skull: Normal. Negative for fracture or focal lesion. Sinuses/Orbits: No acute finding. Other: None. IMPRESSION: No acute intracranial abnormality. Electronically Signed   By: Yetta Glassman M.D.   On: 05/31/2021 08:58   MR BRAIN WO CONTRAST  Result Date: 06/01/2021 CLINICAL DATA:  Seizure, nontraumatic (Age 80-40y). EXAM: MRI HEAD WITHOUT CONTRAST TECHNIQUE: Multiplanar, multiecho pulse sequences of the brain and surrounding structures were obtained without intravenous contrast. COMPARISON:  MRI of the brain January 24, 2015; head CT May 31, 2021. FINDINGS: Brain: No acute infarction, hemorrhage, hydrocephalus, extra-axial collection or mass lesion. Bilateral incomplete vertical component version, more evident on the left. The hippocampi and remainder of the brain parenchyma have normal signal characteristics. Vascular: Normal flow voids. Skull and upper cervical spine: Decreased T1 signal of the visualized upper cervical spine, more  pronounced than on prior MRI, may represent red marrow reconversion. Sinuses/Orbits: Negative. Other: None. IMPRESSION: Incomplete hippocampal inversion. Otherwise, unremarkable of the brain. Electronically Signed   By: Pedro Earls M.D.   On: 06/01/2021 07:10   EEG adult  Result Date: 05/31/2021 Lora Havens, MD     05/31/2021  6:29 PM Patient Name: Nicole Gallegos MRN: [id number] Epilepsy Attending: Lora Havens Referring Physician/Provider: Dr Su Monks Date: 05/31/2021 Duration: 25.18 mins Patient history: 35yo [id number] at [redacted]w[redacted]d with seizure disorder during pregnancy first trimester. EEG to evaluate for seizure. Level of alertness: Awake, asleep AEDs during EEG study: None Technical aspects: This EEG study was done with scalp electrodes positioned according to the 10-20 International system of electrode placement. Electrical activity was acquired at a sampling rate of $Remov'500Hz'EUnWgf$  and reviewed with a high frequency filter of $RemoveB'70Hz'mWzYJLua$  and a low frequency filter of $RemoveB'1Hz'sjghRJFb$ . EEG data were recorded continuously and digitally stored. Description: The posterior dominant rhythm consists of 10 Hz activity of moderate voltage (25-35 uV) seen predominantly in posterior head regions, symmetric and reactive to eye opening and eye closing. Sleep was characterized by vertex waves, sleep spindles (12 to 14 Hz), maximal frontocentral region. Physiologic photic driving was seen during photic stimulation.  Hyperventilation was not performed.   IMPRESSION: This study is within normal limits. No seizures or epileptiform discharges were seen throughout the recording. Lora Havens   ECHOCARDIOGRAM COMPLETE  Result Date: 05/31/2021    ECHOCARDIOGRAM REPORT   Patient Name:   ZANIYAH WERNETTE Date of Exam: 05/31/2021 Medical Rec #:  [id number]      Height:       67.0 in Accession #:    [id number]     Weight:       155.6 lb Date of Birth:  [birth year]      BSA:          1.818 m Patient Age:    35 years       BP:            114/69 mmHg Patient Gender: F              HR:           101 bpm. Exam Location:  Inpatient Procedure: 2D Echo, Cardiac  Doppler and Color Doppler Indications:    Syncope R55  History:        Patient has no prior history of Echocardiogram examinations.                 Arrythmias:Tachycardia. [redacted] weeks gestation. Severe head and                 abdominal pain. Syncope with fall in the elevator.  Sonographer:    Merrie Roof RDCS Referring Phys: [place]  1. Left ventricular ejection fraction, by estimation, is 60 to 65%. The left ventricle has normal function. The left ventricle has no regional wall motion abnormalities. Left ventricular diastolic parameters were normal.  2. Right ventricular systolic function is hyperdynamic. The right ventricular size is normal. Tricuspid regurgitation signal is inadequate for assessing PA pressure.  3. The mitral valve is grossly normal. No evidence of mitral valve regurgitation.  4. The aortic valve was not well visualized. Aortic valve regurgitation is not visualized. Aortic valve mean gradient measures 7.0 mmHg.  5. The inferior vena cava is normal in size with greater than 50% respiratory variability, suggesting right atrial pressure of 3 mmHg.  6. Increased flow velocities may be secondary to hyperdynamic or high flow state (such as pregnancy). Comparison(s): No prior Echocardiogram. FINDINGS  Left Ventricle: Left ventricular ejection fraction, by estimation, is 60 to 65%. The left ventricle has normal function. The left ventricle has no regional wall motion abnormalities. The left ventricular internal cavity size was normal in size. There is  no left ventricular hypertrophy. Left ventricular diastolic parameters were normal. Right Ventricle: The right ventricular size is normal. No increase in right ventricular wall thickness. Right ventricular systolic function is hyperdynamic. Tricuspid regurgitation signal is inadequate for assessing PA  pressure. Left Atrium: Left atrial size was normal in size. Right Atrium: Right atrial size was normal in size. Pericardium: There is no evidence of pericardial effusion. Mitral Valve: The mitral valve is grossly normal. No evidence of mitral valve regurgitation. Tricuspid Valve: The tricuspid valve is grossly normal. Tricuspid valve regurgitation is not demonstrated. Aortic Valve: The aortic valve was not well visualized. Aortic valve regurgitation is not visualized. Aortic valve mean gradient measures 7.0 mmHg. Aortic valve peak gradient measures 14.0 mmHg. Pulmonic Valve: The pulmonic valve was not well visualized. Pulmonic valve regurgitation is not visualized. Aorta: The aortic root and ascending aorta are structurally normal, with no evidence of dilitation. Venous: The inferior vena cava is normal in size with greater than 50% respiratory variability, suggesting right atrial pressure of 3 mmHg. IAS/Shunts: No atrial level shunt detected by color flow Doppler.  LEFT VENTRICLE PLAX 2D LVIDd:         4.90 cm LVIDs:         3.30 cm LV PW:         0.70 cm LV IVS:        0.70 cm  LV Volumes (MOD) LV vol d, MOD A4C: 62.3 ml LV vol s, MOD A4C: 26.3 ml LV SV MOD A4C:     62.3 ml RIGHT VENTRICLE RV Basal diam:  3.80 cm LEFT ATRIUM             Index        RIGHT ATRIUM           Index LA diam:        3.70 cm 2.04 cm/m   RA Area:     17.90 cm LA Vol (A2C):  53.3 ml 29.32 ml/m  RA Volume:   49.10 ml  27.01 ml/m LA Vol (A4C):   47.9 ml 26.35 ml/m LA Biplane Vol: 53.0 ml 29.16 ml/m  AORTIC VALVE AV Vmax:           187.00 cm/s AV Vmean:          124.000 cm/s AV VTI:            0.267 m AV Peak Grad:      14.0 mmHg AV Mean Grad:      7.0 mmHg LVOT Vmax:         136.00 cm/s LVOT Vmean:        88.100 cm/s LVOT VTI:          0.202 m LVOT/AV VTI ratio: 0.76  AORTA Ao Root diam: 2.40 cm Ao Asc diam:  2.00 cm  SHUNTS Systemic VTI: 0.20 m Lyman Bishop MD Electronically signed by Lyman Bishop MD Signature Date/Time:  05/31/2021/3:01:00 PM    Final    [place] MFM OB FOLLOW UP  Result Date: 05/13/2021 ----------------------------------------------------------------------  OBSTETRICS REPORT                       (Signed Final 05/13/2021 03:25 pm) ---------------------------------------------------------------------- Patient Info  ID #:       [id number]                          D.O.B.:  Oct 30, 1985 (35 yrs)  Name:       Earley Brooke Grenz                Visit Date: 05/13/2021 11:40 am ---------------------------------------------------------------------- Performed By  Attending:        Sander Nephew      Ref. Address:     [address]                    MD                                                             [place], [place]  Performed By:     Jeanene Erb BS,      [facility]  Referred By:      [facility] ---------------------------------------------------------------------- Orders  #  Description                           Code        Ordered By  1  [place] MFM OB FOLLOW UP                   [phone]    Peterson Ao ----------------------------------------------------------------------  #  Order #                     Accession #                Episode #  1  [id number]                   [id number]                 [id number] ---------------------------------------------------------------------- Indications  Poor obstetric history: Previous preeclampsia  O09.299  Poor obstetric history: Previous preterm       O09.219  delivery, antepartum (36+1 weeks)  Low risk NIPS, neg AFP/[facility]  Seizure disorder                               O99.350 G40.909  Echogenic intracardiac focus of the heart      O35.8XX0  (EIF)   [redacted] weeks gestation of pregnancy                Z3A.23 ---------------------------------------------------------------------- Fetal Evaluation  Num Of Fetuses:         1  Fetal Heart Rate(bpm):  164  Cardiac Activity:       Observed  Presentation:           Cephalic  Placenta:               Anterior  P. Cord Insertion:      Previously Visualized  Amniotic Fluid  AFI FV:      Within normal limits                              Largest Pocket(cm)                              4.6 ---------------------------------------------------------------------- Biometry  BPD:      55.9  mm     G. Age:  23w 0d         42  %    CI:        72.87   %    70 - 86  FL/HC:      21.0   %    19.2 - 20.8  HC:      208.2  mm     G. Age:  22w 6d         26  %    HC/AC:      1.17        1.05 - 1.21  AC:      178.2  mm     G. Age:  22w 5d         28  %    FL/BPD:     78.4   %    71 - 87  FL:       43.8  mm     G. Age:  24w 3d         78  %    FL/AC:      24.6   %    20 - 24  Est. FW:     589  gm      1 lb 5 oz     54  % ---------------------------------------------------------------------- Gestational Age  U/S Today:     23w 2d                                        EDD:   09/07/21  Best:          23w 1d     Det. ByLoman Chroman         EDD:   09/08/21                                      (01/18/21) ---------------------------------------------------------------------- Anatomy  Cranium:               Appears normal         LVOT:                   Appears normal  Cavum:                 Previously seen        Aortic Arch:            Previously seen  Ventricles:            Previously seen        Ductal Arch:            Previously seen  Choroid Plexus:        Previously seen        Diaphragm:              Appears normal  Cerebellum:            Previously seen        Stomach:                Appears normal, left                                                                        sided   Posterior Fossa:  Previously seen        Abdomen:                Appears normal  Nuchal Fold:           Previously seen        Abdominal Wall:         Appears nml (cord                                                                        insert, abd wall)  Face:                  Appears normal         Cord Vessels:           Previously seen                         (orbits and profile)  Lips:                  Previously seen        Kidneys:                Appear normal  Palate:                Appears normal         Bladder:                Appears normal  Thoracic:              Appears normal         Spine:                  Appears normal  Heart:                 Appears normal; EIF    Upper Extremities:      Previously seen  RVOT:                  Appears normal         Lower Extremities:      Previously seen  Other:  3VV visualized. Technically difficult due to fetal position. ---------------------------------------------------------------------- Cervix Uterus Adnexa  Cervix  Length:              4  cm.  Normal appearance by transabdominal scan. ---------------------------------------------------------------------- Impression  Follow up growth due to complete the fetal anatomy.  Normal interval growth with measurements consistent with  dates  Good fetal movement and amniotic fluid volume ---------------------------------------------------------------------- Recommendations  Follow up as clinically indicated. ----------------------------------------------------------------------               Sander Nephew, MD Electronically Signed Final Report   05/13/2021 03:25 pm ----------------------------------------------------------------------  [place] MFM OB Limited  Result Date: 05/31/2021 ----------------------------------------------------------------------  OBSTETRICS REPORT                       (Signed Final 05/31/2021 05:53 pm) ---------------------------------------------------------------------- Patient  Info  ID #:       [id number]                          D.O.B.:  1985/08/21 (35 yrs)  Name:       Nicole Gallegos                Visit Date: 05/31/2021 08:15 am ---------------------------------------------------------------------- Performed By  Attending:        Johnell Comings MD         Ref. Address:     [address]                                                             [place], [place]  Performed By:     Hubert Azure          Location[facility]                    [place]  Referred By:      [facility] ---------------------------------------------------------------------- Orders  #  Description                           Code        Ordered By  1  [place] MFM OB LIMITED                     [phone]    MELANIE BHAMBRI ----------------------------------------------------------------------  #  Order #                     Accession #                Episode #  1  [id number]                   [id number]                 [id number] ---------------------------------------------------------------------- Indications  Traumatic injury during pregnancy (fell)       O9A.219 T14.90  [redacted] weeks gestation of pregnancy                Z3A.25  Poor obstetric history: Previous preeclampsia  O09.299  Poor obstetric history: Previous preterm       O09.219  delivery, antepartum (36+1 weeks)  Seizure disorder                               O99.350 G40.909  Echogenic intracardiac focus of the heart      O35.8XX0  (EIF) ---------------------------------------------------------------------- Fetal Evaluation  Num Of Fetuses:         1  Fetal Heart  Rate(bpm):  141  Cardiac Activity:       Observed  Presentation:           Cephalic  Placenta:               Anterior  P. Cord Insertion:      Visualized  Amniotic Fluid  AFI FV:      Within normal limits  AFI Sum(cm)     %Tile       Largest Pocket(cm)   14.5            48          5.5  RUQ(cm)       RLQ(cm)       LUQ(cm)        LLQ(cm)  1.6           5.2           2.2            5.5  Comment:    No placental abruption or previa identified. ---------------------------------------------------------------------- Gestational Age  Best:          25w 5d     Det. ByLoman Chroman         EDD:   09/08/21                                      (01/18/21) ---------------------------------------------------------------------- Cervix Uterus Adnexa  Cervix  Length:           4.79  cm.  Normal appearance by transabdominal scan.  Uterus  No abnormality visualized.  Right Ovary  Not visualized.  Left Ovary  Not visualized.  Adnexa  No abnormality visualized. ---------------------------------------------------------------------- Comments  This patient presented to the MAU following a fall.  A limited ultrasound performed today shows that the fetus is  in the vertex presentation.  There was normal amniotic fluid noted.  A normal-appearing anterior placenta is noted. ----------------------------------------------------------------------                   Johnell Comings, MD Electronically Signed Final Report   05/31/2021 05:53 pm ----------------------------------------------------------------------   Future Appointments  Date Time Provider [place]  06/24/2021  8:20 AM [facility]  06/24/2021  9:15 AM Aletha Halim, MD [facility]  08/02/2021 10:30 AM Melvenia Beam, MD [facility] None    Discharge Condition: Stable  Discharge disposition: 01-Home or Self Care       Discharge Instructions     AMB Referral to [facility]   Complete by: As directed    Needs evaluation for possible outpatient Holter monitoring   Reason for referral: Other Comment - Syncope in pregnancy   Ambulatory referral to Neurology   Complete by: As directed    An appointment is requested in approximately: 4-6 wks      Allergies as of 06/01/2021        Reactions   Chocolate Hives        Medication List     TAKE these medications    aspirin EC 81 MG tablet Take 1 tablet (81 mg total) by mouth daily.   Blood Pressure Kit Devi 1 Device by Does not apply route as needed.   cyclobenzaprine 5 MG tablet Commonly known as: FLEXERIL Take 1 tablet (5 mg total) by mouth 3 (three) times daily as needed for muscle spasms.   fluticasone 110 MCG/ACT  inhaler Commonly known as: Flovent HFA Inhale 1 puff into the lungs in the morning and at bedtime.   fluticasone 50 MCG/ACT nasal spray Commonly known as: FLONASE Place 1 spray into both nostrils 2 (two) times daily.   Gojji Weight Scale Misc 1 Device by Does not apply route as needed.   lidocaine 2 % solution Commonly known as: XYLOCAINE Use as directed 5 mLs in the mouth or throat as needed for mouth pain.   multivitamin-prenatal 27-0.8 MG Tabs tablet Take 1 tablet by mouth daily at 12 noon.   ProAir HFA 108 (90 Base) MCG/ACT inhaler Generic drug: albuterol INHALE 2 PUFFS BY MOUTH 4 TIMES A DAY DAILY   pseudoephedrine 30 MG tablet Commonly known as: Sudafed Take 1 tablet (30 mg total) by mouth every 4 (four) hours as needed for congestion.         Total discharge time: 35 minutes   Signed: Verita Schneiders M.D. 06/01/2021, 10:26 AM

## 2021-06-01 NOTE — Plan of Care (Signed)
Neurology plan of care  MRI remarkable for incomplete bilateral hippocampal inversion w/ no other abnl. rEEG WNL. TTE no sig abnl to explain syncope. OK to d/c on no AEDs. I will arrange neuro f/u. Pt should not drive until cleared by outpatient neurology. Pt will be referred to cardio-obstetrics for ambulatory cardiac monitor after discharge.  Bing Neighbors, MD [facility] [phone]  If 7pm- 7am, please page neurology on call as listed in AMION.

## 2021-06-01 NOTE — Progress Notes (Signed)
No FHR tracing from 06:40 to 07:19 due to pt sitting up to eat.

## 2021-06-01 NOTE — H&P (Signed)
History     CSN: [id number]  Arrival date and time: 05/31/21 0707  35 y.o. [id number] _0 .6 wks presenting via stretcher from main elevators d/t witnessed fall and syncope. Witness reports she was in the elevator when she started to fall hitting her head on one side then the front before falling down. She was then brought to MAU via stretcher. Upon interview she was disoriented and unaware of person or place. After a few minutes she could state her name but was unaware of place. Within a few more minutes she was able to state her full name and where she worked at [employer]. She reports taking her son to school this am but cannot recall anything after that. After a few more minutes she remembered being on the elevator. Reports hx of seizures in previous pregnancy d/t postpartum PEC. Reports falling and hitting her head at that time. Also reports hx of a seizure when she was age 88 then not again until she was 89. She is not on seizure meds.   OB History     Gravida  7   Para  4   Term  3   Preterm  1   AB  2   Living  4      SAB  1   IAB  1   Ectopic  0   Multiple  0   Live Births  4           Past Medical History:  Diagnosis Date   Asthma    Exercise induced;inhaler prn   Constipation    Depression    No meds. currently   History of blood transfusion    Accident at 7 yoa and lost a lot of blood   Migraine    Panic attacks    No meds currently;used to take meds in 2009   Preterm labor 07/11/2008   2nd child;had to stop labor 2 times;due to stress   Scoliosis    Seizures ([place])    if gets overheated;no meds, last seizure 6 yrs ago    Past Surgical History:  Procedure Laterality Date   DILATION AND CURETTAGE OF UTERUS     Suction D&C to remove 16 wk. fetal demise   FOOT SURGERY     R foot, repair torn tissue and skin graft   SKIN GRAFT Right    Upper Thigh   TUBAL LIGATION Bilateral 07/02/2016   Procedure: POST PARTUM TUBAL LIGATION with filshie clips;   Surgeon: Osborne Oman, MD;  Location: [facility];  Service: Gynecology;  Laterality: Bilateral;   TUBAL REVERSAL  09/2020   WISDOM TOOTH EXTRACTION  2008    Family History  Problem Relation Age of Onset   Diabetes Mother    COPD Father    Seizures Father        if gets overheated   Seizures Daughter        if gets overheated   Seizures Daughter        if gets overheated   Other Daughter        1st child had 2 vein umbilical cord; Pt did weekly NSTs.   Heart disease Maternal Aunt        Hole in the heart   Heart disease Maternal Uncle        Hole in the heart   Hypertension Maternal Uncle    Hypertension Paternal Uncle    Heart attack Maternal Grandmother        Deceased  Hypertension Maternal Grandmother    Diabetes Paternal Grandmother    Bipolar disorder Cousin        maternal   Depression Cousin        maternal   Anesthesia problems Neg Hx     Social History   Tobacco Use   Smoking status: Never   Smokeless tobacco: Never  Vaping Use   Vaping Use: Never used  Substance Use Topics   Alcohol use: Not Currently    Comment: occasionally   Drug use: No    Allergies:  Allergies  Allergen Reactions   Chocolate Hives    Medications Prior to Admission  Medication Sig Dispense Refill Last Dose   albuterol (VENTOLIN HFA) 108 (90 Base) MCG/ACT inhaler INHALE 2 PUFFS BY MOUTH 4 TIMES A DAY DAILY 8.5 g 5    aspirin EC 81 MG tablet Take 1 tablet (81 mg total) by mouth daily. 60 tablet 2    Blood Pressure Monitoring (BLOOD PRESSURE KIT) DEVI 1 Device by Does not apply route as needed. 1 each 0    cyclobenzaprine (FLEXERIL) 5 MG tablet Take 1 tablet (5 mg total) by mouth 3 (three) times daily as needed for muscle spasms. (Patient not taking: Reported on 05/13/2021) 30 tablet 0    fluticasone (FLONASE) 50 MCG/ACT nasal spray Place 1 spray into both nostrils 2 (two) times daily. 16 g 2    fluticasone (FLOVENT HFA) 110 MCG/ACT inhaler Inhale 1 puff into the lungs in the  morning and at bedtime. 1 each 12    lidocaine (XYLOCAINE) 2 % solution Use as directed 5 mLs in the mouth or throat as needed for mouth pain. (Patient not taking: Reported on 05/27/2021) 100 mL 0    Misc. Devices (GOJJI WEIGHT SCALE) MISC 1 Device by Does not apply route as needed. 1 each 0    Prenatal Vit-Fe Fumarate-FA (MULTIVITAMIN-PRENATAL) 27-0.8 MG TABS tablet Take 1 tablet by mouth daily at 12 noon.      pseudoephedrine (SUDAFED) 30 MG tablet Take 1 tablet (30 mg total) by mouth every 4 (four) hours as needed for congestion. 15 tablet 0     Review of Systems  Gastrointestinal:  Positive for abdominal pain.  Neurological:  Positive for headaches.  Physical Exam   Blood pressure 121/78, pulse 95, last menstrual period 12/01/2020, SpO2 100 %.  Physical Exam Vitals and nursing note reviewed.  Eyes:     Pupils: Pupils are equal, round, and reactive to light.  Cardiovascular:     Rate and Rhythm: Tachycardia present.  Pulmonary:     Effort: No respiratory distress.  Musculoskeletal:        General: Normal range of motion.  Skin:    General: Skin is warm and dry.  Neurological:     Mental Status: She is lethargic and disoriented.     Cranial Nerves: No facial asymmetry.     Deep Tendon Reflexes: Reflexes normal.  Psychiatric:        Speech: Speech is delayed.        Behavior: Behavior is cooperative.        Cognition and Memory: Cognition is impaired. She exhibits impaired recent memory.  EFM: 150 bpm, mod variability, no accels, no decels Toco: none  Results for orders placed or performed during the hospital encounter of 05/31/21 (from the past 24 hour(s))  Glucose, capillary     Status: None   Collection Time: 05/31/21  7:11 AM  Result Value Ref Range   Glucose-Capillary 84  70 - 99 mg/dL  CBC     Status: Abnormal   Collection Time: 05/31/21  7:15 AM  Result Value Ref Range   WBC 8.2 4.0 - 10.5 K/uL   RBC 3.91 3.87 - 5.11 MIL/uL   Hemoglobin 10.8 (L) 12.0 - 15.0 g/dL    HCT 34.2 (L) 36.0 - 46.0 %   MCV 87.5 80.0 - 100.0 fL   MCH 27.6 26.0 - 34.0 pg   MCHC 31.6 30.0 - 36.0 g/dL   RDW 13.7 11.5 - 15.5 %   Platelets 302 150 - 400 K/uL   nRBC 0.2 0.0 - 0.2 %   MAU Course  Procedures  MDM Consult with Dr. Nelda Marseille at bedside. Plan for Neuro consult and imaging.  9774: Pt now reports intermittent abdominal pain, abdomen soft but tender, no ctx on toco, plan for bedside US to eval for abruption. Report and transfer of care given to Dr. Nehemiah Settle.  Julianne Handler, CNM  05/31/2021 8:02 AM   Assumed care of patient. Due to LOC and head trauma - patient still confused, will get head CT wo contrast. Will also get EKG. Labs normal, BP has remained normal - less likely eclampsia.  CT head negative.   EKG - Personally reviewed and interpreted: sinus rhythm, no acute ST changes.  Waiting to hear back from neurology.  Imaging: I independent reviewed the images of the ultrasound. Baby positioned vtx. CL 4.79cm. Anterior placenta. No evidence of abruption.    NST:  Baseline: 140  Variability: moderate Accelerations: none  Decelerations: variable decels Contractions: q3-4  Due to patient having contractions - cervix checked. Dilation: Closed Exam by:: Dr. Nehemiah Settle   Assessment and Plan   1. Supervision of high risk pregnancy, antepartum   2. Fall   3. Abdominal pain affecting pregnancy   4. [redacted] weeks gestation of pregnancy   5. History of eclampsia   6. Syncope, unspecified syncope type   7. Abdominal trauma, initial encounter    I discussed patient with Dr Quinn Axe with neurology. Agreed on not starting antieptileptics at this time. Will admit to Antenatal service. They will consult on the patient and get EEG.   Will check echocardiogram.  Repeat labs in AM.  BMZ 80m q24hours x2. Continuous monitoring for now Continue IVF.  JTruett Mainland DO 05/31/2021 10:32 AM

## 2021-06-08 ENCOUNTER — Telehealth: Payer: Self-pay | Admitting: Family Medicine

## 2021-06-08 NOTE — Telephone Encounter (Signed)
Patient called stating she had a fall at work and went to the emergency room, they told her to call her OB and follow up to make sure no other appointments or care was needed following her fall.

## 2021-06-08 NOTE — Telephone Encounter (Addendum)
Per chart review, pt has referral order for Cardiology. Discharge summary did not state if pt required additional Ob care other than what is already scheduled. Next Ob f/u office appt on 12/15. Note sent to Dr. Macon Large for clarification of additional care is advised.   1730  Response received from Dr. Macon Large stating that pt does not require any additional appointments for Ob care other than 12/15 as scheduled. I called pt and informed her of this information as well as that she will be contacted by Cardiology for consideration of outpatient Holter monitor.  She voiced understanding.

## 2021-06-15 ENCOUNTER — Telehealth: Payer: Self-pay | Admitting: Family Medicine

## 2021-06-15 NOTE — Telephone Encounter (Signed)
Patient called because there was a referral sent to a cardiac doctor for her to be set up to wear a heart monitor for 24 hours. After speaking with a nurse here at the [facility] she was told if she did not hear back form the cardiac doctor within a week to give [place] a call. It has now been two weeks so she is wondering what to do from here.

## 2021-06-16 ENCOUNTER — Telehealth: Payer: Self-pay

## 2021-06-16 NOTE — Telephone Encounter (Signed)
Addressed via MyChart message in separate encounter.

## 2021-06-16 NOTE — Telephone Encounter (Signed)
Called [facility] to follow up on referral placed 06/01/21. Appt scheduled for 07/16/21 at 2:20 PM and pt notified via MyChart message.

## 2021-06-23 ENCOUNTER — Other Ambulatory Visit: Payer: Self-pay | Admitting: *Deleted

## 2021-06-23 DIAGNOSIS — O0990 Supervision of high risk pregnancy, unspecified, unspecified trimester: Secondary | ICD-10-CM

## 2021-06-24 ENCOUNTER — Other Ambulatory Visit: Payer: Self-pay

## 2021-06-24 ENCOUNTER — Encounter: Payer: Self-pay | Admitting: Obstetrics and Gynecology

## 2021-06-24 ENCOUNTER — Ambulatory Visit (INDEPENDENT_AMBULATORY_CARE_PROVIDER_SITE_OTHER): Payer: Medicaid Other | Admitting: Obstetrics and Gynecology

## 2021-06-24 ENCOUNTER — Other Ambulatory Visit: Payer: Medicaid Other

## 2021-06-24 VITALS — BP 118/73 | HR 108 | Wt 159.4 lb

## 2021-06-24 DIAGNOSIS — O09899 Supervision of other high risk pregnancies, unspecified trimester: Secondary | ICD-10-CM

## 2021-06-24 DIAGNOSIS — O0990 Supervision of high risk pregnancy, unspecified, unspecified trimester: Secondary | ICD-10-CM

## 2021-06-24 DIAGNOSIS — R109 Unspecified abdominal pain: Secondary | ICD-10-CM

## 2021-06-24 DIAGNOSIS — J45909 Unspecified asthma, uncomplicated: Secondary | ICD-10-CM

## 2021-06-24 DIAGNOSIS — G43009 Migraine without aura, not intractable, without status migrainosus: Secondary | ICD-10-CM

## 2021-06-24 DIAGNOSIS — Z23 Encounter for immunization: Secondary | ICD-10-CM | POA: Diagnosis not present

## 2021-06-24 DIAGNOSIS — G40909 Epilepsy, unspecified, not intractable, without status epilepticus: Secondary | ICD-10-CM

## 2021-06-24 DIAGNOSIS — O26899 Other specified pregnancy related conditions, unspecified trimester: Secondary | ICD-10-CM

## 2021-06-24 DIAGNOSIS — Z3A29 29 weeks gestation of pregnancy: Secondary | ICD-10-CM

## 2021-06-24 DIAGNOSIS — R7989 Other specified abnormal findings of blood chemistry: Secondary | ICD-10-CM

## 2021-06-24 DIAGNOSIS — O99351 Diseases of the nervous system complicating pregnancy, first trimester: Secondary | ICD-10-CM

## 2021-06-24 DIAGNOSIS — O99512 Diseases of the respiratory system complicating pregnancy, second trimester: Secondary | ICD-10-CM

## 2021-06-24 NOTE — Progress Notes (Signed)
Pt

## 2021-06-24 NOTE — Progress Notes (Signed)
° ° °  PRENATAL VISIT NOTE  Subjective:  Nicole Gallegos is a 35 y.o. [id number] at [redacted]w[redacted]d being seen today for ongoing prenatal care.  She is currently monitored for the following issues for this high-risk pregnancy and has Anxiety and depression; Asthma affecting pregnancy in second trimester; Seizure disorder during pregnancy in first trimester (HCC); Tension headache, chronic; Migraine; History of eclampsia; Supervision of high risk pregnancy, antepartum; History of reversal of tubal ligation; History of preterm delivery, currently pregnant; Low TSH level; Syncope; and Abdominal pain affecting pregnancy on their problem list.  Patient reports no complaints.  Contractions: Not present. Vag. Bleeding: None.  Movement: Present. Denies leaking of fluid.   The following portions of the patient's history were reviewed and updated as appropriate: allergies, current medications, past family history, past medical history, past social history, past surgical history and problem list.   Objective:   Vitals:   06/24/21 0849  BP: 118/73  Pulse: (!) 108  Weight: 159 lb 6.4 oz (72.3 kg)    Fetal Status: Fetal Heart Rate (bpm): 134 Fundal Height: 28 cm Movement: Present     General:  Alert, oriented and cooperative. Patient is in no acute distress.  Skin: Skin is warm and dry. No rash noted.   Cardiovascular: Normal heart rate noted  Respiratory: Normal respiratory effort, no problems with respiration noted  Abdomen: Soft, gravid, appropriate for gestational age.  Pain/Pressure: Present     Pelvic: Cervical exam deferred        Extremities: Normal range of motion.  Edema: None  Mental Status: Normal mood and affect. Normal behavior. Normal judgment and thought content.   Assessment and Plan:  Pregnancy: [id number] at [redacted]w[redacted]d 1. Low TSH level Recheck today - TSH  2. Supervision of high risk pregnancy, antepartum 28wk labs today - Tdap vaccine greater than or equal to 7yo IM - [place] MFM OB FOLLOW UP;  Future  3. Asthma affecting pregnancy in second trimester Continue fluticasone. Surveillance growth u/s ordered - US MFM OB FOLLOW UP; Future  4. [redacted] weeks gestation of pregnancy  5. Seizure disorder during pregnancy in first trimester [facility]) Has neurology f/u in january  6. History of syncope Has cardiology f/u in January. See recent OB admissioin with negative cardiac and neuro w/u   Preterm labor symptoms and general obstetric precautions including but not limited to vaginal bleeding, contractions, leaking of fluid and fetal movement were reviewed in detail with the patient. Please refer to After Visit Summary for other counseling recommendations.   Return in about 2 weeks (around 07/08/2021) for in person, md visit, high risk ob.  Future Appointments  Date Time Provider Department Center  06/24/2021  9:15 AM [place] Bing, MD [facility]  07/16/2021  2:20 PM Thomasene Ripple, DO [facility] None  08/02/2021 10:30 AM Anson Fret, MD [facility] None    [place] Bing, MD

## 2021-06-25 LAB — CBC
Hematocrit: 30.7 % — ABNORMAL LOW (ref 34.0–46.6)
Hemoglobin: 10.2 g/dL — ABNORMAL LOW (ref 11.1–15.9)
MCH: 26.2 pg — ABNORMAL LOW (ref 26.6–33.0)
MCHC: 33.2 g/dL (ref 31.5–35.7)
MCV: 79 fL (ref 79–97)
Platelets: 269 10*3/uL (ref 150–450)
RBC: 3.89 x10E6/uL (ref 3.77–5.28)
RDW: 14 % (ref 11.7–15.4)
WBC: 5.7 10*3/uL (ref 3.4–10.8)

## 2021-06-25 LAB — GLUCOSE TOLERANCE, 2 HOURS W/ 1HR
Glucose, 1 hour: 108 mg/dL (ref 70–179)
Glucose, 2 hour: 99 mg/dL (ref 70–152)
Glucose, Fasting: 77 mg/dL (ref 70–91)

## 2021-06-25 LAB — RPR: RPR Ser Ql: NONREACTIVE

## 2021-06-25 LAB — HIV ANTIBODY (ROUTINE TESTING W REFLEX): HIV Screen 4th Generation wRfx: NONREACTIVE

## 2021-06-26 ENCOUNTER — Other Ambulatory Visit: Payer: Self-pay | Admitting: Obstetrics and Gynecology

## 2021-06-26 MED ORDER — FERROUS GLUCONATE 324 (38 FE) MG PO TABS: 324.0000 mg | ORAL_TABLET | ORAL | 0 refills | Status: DC

## 2021-06-26 MED ORDER — DOCUSATE SODIUM 100 MG PO CAPS: 100.0000 mg | ORAL_CAPSULE | Freq: Two times a day (BID) | ORAL | 1 refills | Status: DC | PRN

## 2021-07-09 ENCOUNTER — Other Ambulatory Visit: Payer: Self-pay

## 2021-07-09 ENCOUNTER — Ambulatory Visit: Payer: Medicaid Other | Admitting: *Deleted

## 2021-07-09 ENCOUNTER — Other Ambulatory Visit: Payer: Self-pay | Admitting: *Deleted

## 2021-07-09 ENCOUNTER — Ambulatory Visit: Payer: Medicaid Other | Attending: Obstetrics and Gynecology

## 2021-07-09 ENCOUNTER — Encounter: Payer: Medicaid Other | Admitting: Obstetrics and Gynecology

## 2021-07-09 VITALS — BP 106/71

## 2021-07-09 DIAGNOSIS — J45909 Unspecified asthma, uncomplicated: Secondary | ICD-10-CM | POA: Insufficient documentation

## 2021-07-09 DIAGNOSIS — O99512 Diseases of the respiratory system complicating pregnancy, second trimester: Secondary | ICD-10-CM | POA: Diagnosis present

## 2021-07-09 DIAGNOSIS — O09893 Supervision of other high risk pregnancies, third trimester: Secondary | ICD-10-CM

## 2021-07-09 DIAGNOSIS — O0990 Supervision of high risk pregnancy, unspecified, unspecified trimester: Secondary | ICD-10-CM | POA: Diagnosis present

## 2021-07-09 DIAGNOSIS — O09293 Supervision of pregnancy with other poor reproductive or obstetric history, third trimester: Secondary | ICD-10-CM

## 2021-07-09 DIAGNOSIS — O283 Abnormal ultrasonic finding on antenatal screening of mother: Secondary | ICD-10-CM

## 2021-07-11 NOTE — L&D Delivery Note (Addendum)
OB/GYN Faculty Practice Delivery Note  Nicole Gallegos is a 36 y.o. [id number] s/p SVD at [redacted]w[redacted]d. She was admitted for SROM/SOL.   ROM: 1h 62m with meconium stained fluid GBS Status: positive, ampicillin ordered but delivery prior to administration   Delivery Date/Time: 2242 Delivery: Called to room and patient was complete and pushing. Head delivered LOA. No nuchal cord present. Shoulder and body delivered in usual fashion. Infant with spontaneous cry, placed on mother's abdomen, dried and stimulated. Cord clamped x 2 after 1-minute delay, and cut by FOB under my direct supervision. Cord blood drawn. Placenta delivered spontaneously with gentle cord traction. Fundus firm with massage and Pitocin. Labia, perineum, vagina, and cervix were inspected, no lacerations.  Placenta: Intact, 3VC, sent to L&D Complications: None Lacerations: None EBL: 50 cc Analgesia: None  Infant: Female   APGARs 8   9   weight pending   Nicole Pancoast, DO PGY-1 08/29/2021, 11:15 PM   Attestation of CNM Supervision of Resident: Evaluation and management procedures were performed by the [facility] Resident under my supervision. I was immediately present, gloved, and available for direct supervision, assistance and direction throughout this encounter.  I also confirm that I have verified the information documented in the residents note, and that I have also personally reperformed the pertinent components of the physical exam and all of the medical decision making activities.  I have also made any necessary editorial changes.  Nicole Gallegos, CNM 08/30/2021 12:38 AM

## 2021-07-16 ENCOUNTER — Other Ambulatory Visit: Payer: Self-pay

## 2021-07-16 ENCOUNTER — Ambulatory Visit (INDEPENDENT_AMBULATORY_CARE_PROVIDER_SITE_OTHER): Payer: Medicaid Other | Admitting: Cardiology

## 2021-07-16 ENCOUNTER — Ambulatory Visit (INDEPENDENT_AMBULATORY_CARE_PROVIDER_SITE_OTHER): Payer: Medicaid Other

## 2021-07-16 ENCOUNTER — Encounter: Payer: Self-pay | Admitting: Cardiology

## 2021-07-16 VITALS — BP 106/62 | HR 92 | Ht 67.0 in | Wt 161.0 lb

## 2021-07-16 DIAGNOSIS — O1490 Unspecified pre-eclampsia, unspecified trimester: Secondary | ICD-10-CM | POA: Diagnosis not present

## 2021-07-16 DIAGNOSIS — R55 Syncope and collapse: Secondary | ICD-10-CM
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
## 2021-07-16 NOTE — Patient Instructions (Addendum)
Medication Instructions:  °Your physician recommends that you continue on your current medications as directed. Please refer to the Current Medication list given to you today. ° °*If you need a refill on your cardiac medications before your next appointment, please call your pharmacy* ° °Lab Work: °NONE ordered at this time of appointment  ° °If you have labs (blood work) drawn today and your tests are completely normal, you will receive your results only by: °MyChart Message (if you have MyChart) OR °A paper copy in the mail °If you have any lab test that is abnormal or we need to change your treatment, we will call you to review the results. ° °Testing/Procedures: °ZIO AT Long term monitor-Live Telemetry ° °Your physician has requested you wear a ZIO patch monitor for 14 days.  °This is a single patch monitor. [employer] supplies one patch monitor per enrollment. Additional  °stickers are not available.  °Please do not apply patch if you will be having a Nuclear Stress Test, Echocardiogram, Cardiac CT, MRI,  °or Chest Xray during the period you would be wearing the monitor. The patch cannot be worn during  °these tests. You cannot remove and re-apply the ZIO AT patch monitor.  °Your ZIO patch monitor will be mailed 3 day USPS to your address on file. It may take 3-5 days to  °receive your monitor after you have been enrolled.  °Once you have received your monitor, please review the enclosed instructions. Your monitor has  °already been registered assigning a specific monitor serial # to you.  ° °Billing and Patient Assistance Program information ° °[employer] has been supplied with any insurance information on record for billing. °[employer] offers a sliding scale Patient Assistance Program for patients without insurance, or whose  °insurance does not completely cover the cost of the ZIO patch monitor. You must apply for the  °Patient Assistance Program to qualify for the discounted rate. To apply, call [employer] at  [phone],  °select option 4, select option 2 , ask to apply for the Patient Assistance Program, (you can request an  °interpreter if needed). [employer] will ask your household income and how many people are in your  °household. [employer] will quote your out-of-pocket cost based on this information. They will also be able  °to set up a 12 month interest free payment plan if needed. ° °Applying the monitor  ° °Shave hair from upper left chest.  °Hold the abrader disc by orange tab. Rub the abrader in 40 strokes over left upper chest as indicated in  °your monitor instructions.  °Clean area with 4 enclosed alcohol pads. Use all pads to ensure the area is cleaned thoroughly. Let  °dry.  °Apply patch as indicated in monitor instructions. Patch will be placed under collarbone on left side of  °chest with arrow pointing upward.  °Rub patch adhesive wings for 2 minutes. Remove the white label marked "1". Remove the white label  °marked "2". Rub patch adhesive wings for 2 additional minutes.  °While looking in a mirror, press and release button in center of patch. A small green light will flash 3-4  °times. This will be your only indicator that the monitor has been turned on.  °Do not shower for the first 24 hours. You may shower after the first 24 hours.  °Press the button if you feel a symptom. You will hear a small click. Record Date, Time and Symptom in  °the Patient Log.  ° °Starting the Gateway ° °In your kit   there is a small plastic box the size of a cellphone. This is Airline pilot. It transmits all your  recorded data to [facility]. This box must always stay within 10 feet of you. Open the box and push the *  button. There will be a light that blinks orange and then green a few times. When the light stops  blinking, the Gateway is connected to the ZIO patch. Call [employer] at [phone] to confirm your monitor is transmitting.  Returning your monitor  Remove your patch and place it inside the [place]. In the  lower half of the Gateway there is a white  bag with prepaid postage on it. Place Gateway in bag and seal. Mail package back to [place] as soon as  possible. Your physician should have your final report approximately 7 days after you have mailed back  your monitor. Call [place] at [phone] if you have questions regarding your ZIO AT  patch monitor. Call them immediately if you see an orange light blinking on your monitor.  If your monitor falls off in less than 4 days, contact our Monitor department at [phone]. If your  monitor becomes loose or falls off after 4 days call [employer] at [phone] for suggestions on  securing your monitor   Follow-Up: At [facility], you and your health needs are our priority.  As part of our continuing mission to provide you with exceptional heart care, we have created designated Provider Care Teams.  These Care Teams include your primary Cardiologist (physician) and Advanced Practice Providers (APPs -  Physician Assistants and Nurse Practitioners) who all work together to provide you with the care you need, when you need it.  We recommend signing up for the patient portal called "MyChart".  Sign up information is provided on this After Visit Summary.  MyChart is used to connect with patients for Virtual Visits (Telemedicine).  Patients are able to view lab/test results, encounter notes, upcoming appointments, etc.  Non-urgent messages can be sent to your provider as well.   To learn more about what you can do with MyChart, go to [email or web address].    Your next appointment:   6 week(s)  The format for your next appointment:   In Person  Provider:   Berniece Salines, DO    Other Instructions

## 2021-07-20 ENCOUNTER — Ambulatory Visit (INDEPENDENT_AMBULATORY_CARE_PROVIDER_SITE_OTHER): Payer: Medicaid Other | Admitting: Obstetrics and Gynecology

## 2021-07-20 ENCOUNTER — Encounter: Payer: Self-pay | Admitting: Family Medicine

## 2021-07-20 ENCOUNTER — Other Ambulatory Visit: Payer: Self-pay

## 2021-07-20 ENCOUNTER — Encounter: Payer: Self-pay | Admitting: Obstetrics and Gynecology

## 2021-07-20 VITALS — BP 119/78 | HR 105 | Wt 162.0 lb

## 2021-07-20 DIAGNOSIS — G40909 Epilepsy, unspecified, not intractable, without status epilepticus: Secondary | ICD-10-CM

## 2021-07-20 DIAGNOSIS — O99513 Diseases of the respiratory system complicating pregnancy, third trimester: Secondary | ICD-10-CM

## 2021-07-20 DIAGNOSIS — O0990 Supervision of high risk pregnancy, unspecified, unspecified trimester: Secondary | ICD-10-CM

## 2021-07-20 DIAGNOSIS — R55 Syncope and collapse: Secondary | ICD-10-CM

## 2021-07-20 DIAGNOSIS — Z8759 Personal history of other complications of pregnancy, childbirth and the puerperium: Secondary | ICD-10-CM

## 2021-07-20 DIAGNOSIS — Z3A33 33 weeks gestation of pregnancy: Secondary | ICD-10-CM

## 2021-07-20 DIAGNOSIS — O99351 Diseases of the nervous system complicating pregnancy, first trimester: Secondary | ICD-10-CM

## 2021-07-20 DIAGNOSIS — J45909 Unspecified asthma, uncomplicated: Secondary | ICD-10-CM

## 2021-07-20 MED ORDER — MONTELUKAST SODIUM 10 MG PO TABS: 10.0000 mg | ORAL_TABLET | Freq: Every day | ORAL | 1 refills | Status: DC

## 2021-07-20 MED ORDER — FAMOTIDINE 20 MG PO TABS: 20.0000 mg | ORAL_TABLET | Freq: Two times a day (BID) | ORAL | 0 refills | Status: DC

## 2021-07-20 NOTE — Progress Notes (Signed)
° °  PRENATAL VISIT NOTE  Subjective:  Nicole Gallegos is a 36 y.o. [id number] at [redacted]w[redacted]d being seen today for ongoing prenatal care.  She is currently monitored for the following issues for this high-risk pregnancy and has Anxiety and depression; Asthma complicating pregnancy in third trimester; Seizure disorder during pregnancy in first trimester (HCC); Tension headache, chronic; Migraine; History of eclampsia; Supervision of high risk pregnancy, antepartum; History of reversal of tubal ligation; History of preterm delivery, currently pregnant; Syncope; and Abdominal pain affecting pregnancy on their problem list.  Patient reports  doesn't thinking the fluticasone is working as well anymore .  Contractions: Irritability. Vag. Bleeding: None.  Movement: Present. Denies leaking of fluid.   The following portions of the patient's history were reviewed and updated as appropriate: allergies, current medications, past family history, past medical history, past social history, past surgical history and problem list.   Objective:   Vitals:   07/20/21 1529  BP: 119/78  Pulse: (!) 105  Weight: 162 lb (73.5 kg)    Fetal Status: Fetal Heart Rate (bpm): 142   Movement: Present     General:  Alert, oriented and cooperative. Patient is in no acute distress.  Skin: Skin is warm and dry. No rash noted.   Cardiovascular: Normal heart rate noted  Respiratory: Normal respiratory effort, no problems with respiration noted  Abdomen: Soft, gravid, appropriate for gestational age.  Pain/Pressure: Present     Pelvic: Cervical exam deferred        Extremities: Normal range of motion.  Edema: None  Mental Status: Normal mood and affect. Normal behavior. Normal judgment and thought content.   Assessment and Plan:  Pregnancy: [id number] at [redacted]w[redacted]d 1. Supervision of high risk pregnancy, antepartum Gbs at 36wks. Has follow up u/s on 1/27, 12/30: afi 9.8, efw 33%, 1706gm, ac 26%  2. [redacted] weeks gestation of pregnancy  3.  Asthma complicating pregnancy in third trimester On fluticasone 110 bid. She has gerd s/s. I sent in pepcid bid and singulair and told her to stay on the fluticasone at the current dose and give it a week and if still doesn't see improvement to let us know and can increase her INH dose  4. Seizure disorder during pregnancy in first trimester [facility]) Has neuro f/u in January. No current issues  5. History of eclampsia Continue low dose asa  6. Syncope, unspecified syncope type Has heart monitor on currently. Seen by Dr. Servando Salina on 1/6 and no other interventions recommended. 11/22 echo wnl.   Preterm labor symptoms and general obstetric precautions including but not limited to vaginal bleeding, contractions, leaking of fluid and fetal movement were reviewed in detail with the patient. Please refer to After Visit Summary for other counseling recommendations.   Return in about 17 days (around 08/06/2021) for in person, high risk ob, md visit.  Future Appointments  Date Time Provider Department Center  08/02/2021 10:30 AM Anson Fret, MD [facility] None  08/06/2021  3:30 PM [facility] NURSE [facility]  08/06/2021  3:45 PM [facility] US5 [facility]  09/02/2021  8:20 AM Tobb, [place]ona [place], DO [facility]    [place], MD

## 2021-07-24 ENCOUNTER — Encounter: Payer: Self-pay | Admitting: Radiology

## 2021-08-01 NOTE — Progress Notes (Signed)
g

## 2021-08-02 ENCOUNTER — Other Ambulatory Visit: Payer: Self-pay

## 2021-08-02 ENCOUNTER — Ambulatory Visit: Payer: Medicaid Other | Admitting: Neurology

## 2021-08-02 ENCOUNTER — Encounter: Payer: Self-pay | Admitting: Neurology

## 2021-08-02 VITALS — BP 109/73 | HR 98 | Ht 68.0 in | Wt 161.4 lb

## 2021-08-02 DIAGNOSIS — R55 Syncope and collapse: Secondary | ICD-10-CM

## 2021-08-02 NOTE — Patient Instructions (Signed)
I believe this has to do  with low blood pressure, we discussed  Non-Drug Treatment for Low Blood Pressure on Standing: if have feeling, SIT DOWN OR LAY DOWN WHEREVER YOU ARE, even may raise legs in the air.   1. Changing Postures:  Change posture slowly when getting up, especially in the morning  Hold on to something during the first few minutes after standing up. Do not start walking as soon as you get up from the chair  Avoid prolonged recumbency or lying down  Raise the head of the bed by 10 to 20 degrees  2. Exercise:  Perform Isotonic exercise, e.g.recumbent bike, pedaling movements while sitting in a chair  Avoid exercises where you have to strain   3. Avoid Pooling of blood in legs:  Wear custom-fitted elastic stockings. The ones which extend to the abdomen work even better. Consider wearing an abdominal binder.  Perform physical counter-maneuvers, such as crossing legs and tensing leg muscles.  4. Eating and Drinking:  Small meals are recommended. Avoid large meals. Avoid standing suddenly after a large meal  Avoid alcohol  Increase intake of fluids and regular salt. A daily intake of up to 10 grams of sodium per day and a fluid intake of 2.0 to 2.5 liters per day (8 to 10 glasses of water) is recommended.   Rapid (over 3 minutes) ingestion of approximately 0.5 liter (2 glasses of water) of tap water, raises blood pressure within 5 to 15 minutes and lasts for an hour.  5. Other Tips:  Avoid hot baths. Instead take warm baths  Maintain a BP record standing and lying down  If you are only any BP lowering drugs (antihypertensives, diuretics, antidepressants, drugs for prostate, etc) ask your doctor to revisit the need to keep you on these drugs  If all non-drug therapy fails, ask your doctor about drug therapy   Follow-up with primary care physician.    Per [facility] statutes, patients with seizures are not allowed to drive until they have been seizure-free for six  months.    Use caution when using heavy equipment or power tools. Avoid working on ladders or at heights. Take showers instead of baths. Ensure the water temperature is not too high on the home water heater. Do not go swimming alone. Do not lock yourself in a room alone (i.e. bathroom). When caring for infants or small children, sit down when holding, feeding, or changing them to minimize risk of injury to the child in the event you have a seizure. Maintain good sleep hygiene. Avoid alcohol.    If patient has another seizure, call 911 and bring them back to the ED if: A.  The seizure lasts longer than 5 minutes.      B.  The patient doesn't wake shortly after the seizure or has new problems such as difficulty seeing, speaking or moving following the seizure C.  The patient was injured during the seizure D.  The patient has a temperature over 102 F (39C) E.  The patient vomited during the seizure and now is having trouble breathing  Per [facility] statutes, patients with seizures are not allowed to drive until they have been seizure-free for six months.  Other recommendations include using caution when using heavy equipment or power tools. Avoid working on ladders or at heights. Take showers instead of baths.  Do not swim alone.  Ensure the water temperature is not too high on the home water heater. Do not go  swimming alone. Do not lock yourself in a room alone (i.e. bathroom). When caring for infants or small children, sit down when holding, feeding, or changing them to minimize risk of injury to the child in the event you have a seizure. Maintain good sleep hygiene. Avoid alcohol.  Also recommend adequate sleep, hydration, good diet and minimize stress.  During the Seizure  - First, ensure adequate ventilation and place patients on the floor on their left side  Loosen clothing around the neck and ensure the airway is patent. If the patient is clenching the teeth, do not force the mouth open  with any object as this can cause severe damage - Remove all items from the surrounding that can be hazardous. The patient may be oblivious to what's happening and may not even know what he or she is doing. If the patient is confused and wandering, either gently guide him/her away and block access to outside areas - Reassure the individual and be comforting - Call 911. In most cases, the seizure ends before EMS arrives. However, there are cases when seizures may last over 3 to 5 minutes. Or the individual may have developed breathing difficulties or severe injuries. If a pregnant patient or a person with diabetes develops a seizure, it is prudent to call an ambulance. - Finally, if the patient does not regain full consciousness, then call EMS. Most patients will remain confused for about 45 to 90 minutes after a seizure, so you must use judgment in calling for help. - Avoid restraints but make sure the patient is in a bed with padded side rails - Place the individual in a lateral position with the neck slightly flexed; this will help the saliva drain from the mouth and prevent the tongue from falling backward - Remove all nearby furniture and other hazards from the area - Provide verbal assurance as the individual is regaining consciousness - Provide the patient with privacy if possible - Call for help and start treatment as ordered by the caregiver   fter the Seizure (Postictal Stage)  After a seizure, most patients experience confusion, fatigue, muscle pain and/or a headache. Thus, one should permit the individual to sleep. For the next few days, reassurance is essential. Being calm and helping reorient the person is also of importance.  Most seizures are painless and end spontaneously. Seizures are not harmful to others but can lead to complications such as stress on the lungs, brain and the heart. Individuals with prior lung problems may develop labored breathing and respiratory distress.
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
## 2021-08-03 ENCOUNTER — Inpatient Hospital Stay (HOSPITAL_COMMUNITY)
Admission: AD | Admit: 2021-08-03 | Discharge: 2021-08-03 | Disposition: A | Discharge status: Home / Self Care | Payer: Medicaid Other | Attending: Obstetrics and Gynecology | Admitting: Obstetrics and Gynecology

## 2021-08-03 ENCOUNTER — Other Ambulatory Visit: Payer: Self-pay

## 2021-08-03 ENCOUNTER — Encounter (HOSPITAL_COMMUNITY): Payer: Self-pay | Admitting: Obstetrics and Gynecology

## 2021-08-03 ENCOUNTER — Telehealth: Payer: Self-pay | Admitting: Family Medicine

## 2021-08-03 ENCOUNTER — Inpatient Hospital Stay (HOSPITAL_BASED_OUTPATIENT_CLINIC_OR_DEPARTMENT_OTHER): Payer: Medicaid Other

## 2021-08-03 DIAGNOSIS — Z3689 Encounter for other specified antenatal screening: Secondary | ICD-10-CM | POA: Diagnosis not present

## 2021-08-03 DIAGNOSIS — O99353 Diseases of the nervous system complicating pregnancy, third trimester: Secondary | ICD-10-CM | POA: Insufficient documentation

## 2021-08-03 DIAGNOSIS — O26893 Other specified pregnancy related conditions, third trimester: Secondary | ICD-10-CM | POA: Insufficient documentation

## 2021-08-03 DIAGNOSIS — G40909 Epilepsy, unspecified, not intractable, without status epilepticus: Secondary | ICD-10-CM | POA: Insufficient documentation

## 2021-08-03 DIAGNOSIS — Z3A34 34 weeks gestation of pregnancy: Secondary | ICD-10-CM

## 2021-08-03 DIAGNOSIS — O09213 Supervision of pregnancy with history of pre-term labor, third trimester: Secondary | ICD-10-CM | POA: Diagnosis not present

## 2021-08-03 DIAGNOSIS — O358XX0 Maternal care for other (suspected) fetal abnormality and damage, not applicable or unspecified: Secondary | ICD-10-CM | POA: Insufficient documentation

## 2021-08-03 DIAGNOSIS — R102 Pelvic and perineal pain: Secondary | ICD-10-CM | POA: Insufficient documentation

## 2021-08-03 DIAGNOSIS — O368190 Decreased fetal movements, unspecified trimester, not applicable or unspecified: Secondary | ICD-10-CM

## 2021-08-03 DIAGNOSIS — O368130 Decreased fetal movements, third trimester, not applicable or unspecified: Secondary | ICD-10-CM

## 2021-08-03 DIAGNOSIS — N898 Other specified noninflammatory disorders of vagina: Secondary | ICD-10-CM | POA: Diagnosis not present

## 2021-08-03 DIAGNOSIS — Z3A35 35 weeks gestation of pregnancy: Secondary | ICD-10-CM | POA: Diagnosis not present

## 2021-08-03 DIAGNOSIS — O0990 Supervision of high risk pregnancy, unspecified, unspecified trimester: Secondary | ICD-10-CM

## 2021-08-03 DIAGNOSIS — O09293 Supervision of pregnancy with other poor reproductive or obstetric history, third trimester: Secondary | ICD-10-CM | POA: Insufficient documentation

## 2021-08-03 LAB — URINALYSIS, ROUTINE W REFLEX MICROSCOPIC
Bilirubin Urine: NEGATIVE
Glucose, UA: NEGATIVE mg/dL
Hgb urine dipstick: NEGATIVE
Ketones, ur: 5 mg/dL — AB
Leukocytes,Ua: NEGATIVE
Nitrite: NEGATIVE
Protein, ur: 30 mg/dL — AB
Specific Gravity, Urine: 1.03 (ref 1.005–1.030)
pH: 5 (ref 5.0–8.0)

## 2021-08-03 LAB — WET PREP, GENITAL
Clue Cells Wet Prep HPF POC: NONE SEEN
Sperm: NONE SEEN
Trich, Wet Prep: NONE SEEN
WBC, Wet Prep HPF POC: >=10 — AB (ref <10)
Yeast Wet Prep HPF POC: NONE SEEN

## 2021-08-03 NOTE — Telephone Encounter (Signed)
Called pt to follow up if patient will be able to go to MAU. 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 Pt states baby did begin moving after drinking the water. States this movement is less than was normal before, patient thinks it may be due to limited space. Verified with patient that she did not feel any movement from the baby for 2 days. Recommended patient go immediately to the Maternity Assessment Unit for evaluation.

## 2021-08-03 NOTE — MAU Note (Signed)
Presents with c/o decreased FM, states moving but less than usual.  States initially hadn't felt any movement in 2 days.  Denies VB or LOF.

## 2021-08-03 NOTE — Telephone Encounter (Signed)
Patient called into the office requesting to have an earlier appointment due to her not having felt the baby move for 2 days, I put the patient on hold and spoke with nurse Nolberto Hanlon who was standing behind me and she advised me to tell the patient to go to MAU to be evaluated. Patient agreed to head to MAU.
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
## 2021-08-03 NOTE — MAU Provider Note (Addendum)
History     CSN: [id number]  Arrival date and time: 08/03/21 1542   None     Chief Complaint  Patient presents with   Decreased Fetal Movement   HPI Nicole Gallegos is a 36 yo [id number] @ 35w presents to MAU for 2 days of decreased fetal movement and vaginal pressure.  Nicole Gallegos reports fetal movement has changed with "small, occasional tap".  She reports decreased appetite, but no N/V/D.  Diet recall includes: 3 meals and 32 oz of fluid in the last 24 hours.  Nicole Gallegos also reports increased pressure and "mild cramping or contractions" over the last 2 weeks that has worsened over the last 2 days.  Pressure relieved with resting and returns with increased activity. No VB, discharge or dysuria.  She denies recent intercourse and no new partners.   OB History     Gravida  7   Para  4   Term  3   Preterm  1   AB  2   Living  4      SAB  1   IAB  1   Ectopic  0   Multiple  0   Live Births  4           Past Medical History:  Diagnosis Date   Asthma    Exercise induced;inhaler prn   Constipation    Depression    No meds. currently   History of blood transfusion    Accident at 5 yoa and lost a lot of blood   History of blood transfusion 09/24/2012   Age 43 due to accident   Low TSH level 03/04/2021   [ ]  rpt tsh 28wks [x ] f/u add ft4: neg    Migraine    Panic attacks    No meds currently;used to take meds in 2009   Preterm labor 07/11/2008   2nd child;had to stop labor 2 times;due to stress   Scoliosis    Seizures ([place])    if gets overheated;no meds, last seizure 6 yrs ago    Past Surgical History:  Procedure Laterality Date   DILATION AND CURETTAGE OF UTERUS     Suction D&C to remove 16 wk. fetal demise   FOOT SURGERY     R foot, repair torn tissue and skin graft   SKIN GRAFT Right    Upper Thigh   TUBAL LIGATION Bilateral 07/02/2016   Procedure: POST PARTUM TUBAL LIGATION with filshie clips;  Surgeon: Osborne Oman, MD;  Location: [facility];  Service: Gynecology;   Laterality: Bilateral;   TUBAL REVERSAL  09/2020   WISDOM TOOTH EXTRACTION  2008    Family History  Problem Relation Age of Onset   Diabetes Mother    COPD Father    Seizures Father        if gets overheated   Migraines Father    Heart attack Maternal Grandmother        Deceased   Hypertension Maternal Grandmother    Diabetes Paternal Grandmother    Seizures Daughter        if gets overheated   Seizures Daughter        if gets overheated   Other Daughter        1st child had 2 vein umbilical cord; Pt did weekly NSTs.   Heart disease Maternal Aunt        Hole in the heart   Heart disease Maternal Uncle        Hole in the  heart   Hypertension Maternal Uncle    Hypertension Paternal Uncle    Bipolar disorder Cousin        maternal   Depression Cousin        maternal   Anesthesia problems Neg Hx     Social History   Tobacco Use   Smoking status: Never   Smokeless tobacco: Never  Vaping Use   Vaping Use: Never used  Substance Use Topics   Alcohol use: Not Currently    Comment: occasionally   Drug use: No    Allergies:  Allergies  Allergen Reactions   Chocolate Hives    Medications Prior to Admission  Medication Sig Dispense Refill Last Dose   aspirin EC 81 MG tablet Take 1 tablet (81 mg total) by mouth daily. 60 tablet 2 08/03/2021   fluticasone (FLOVENT HFA) 110 MCG/ACT inhaler Inhale 1 puff into the lungs in the morning and at bedtime. 1 each 12 08/03/2021   montelukast (SINGULAIR) 10 MG tablet Take 1 tablet (10 mg total) by mouth at bedtime. 30 tablet 1 08/03/2021   Prenatal Vit-Fe Fumarate-FA (MULTIVITAMIN-PRENATAL) 27-0.8 MG TABS tablet Take 1 tablet by mouth daily at 12 noon.   08/03/2021   Blood Pressure Monitoring (BLOOD PRESSURE KIT) DEVI 1 Device by Does not apply route as needed. 1 each 0    docusate sodium (COLACE) 100 MG capsule Take 1 capsule (100 mg total) by mouth 2 (two) times daily as needed for mild constipation. 40 capsule 1    Misc. Devices  (GOJJI WEIGHT SCALE) MISC 1 Device by Does not apply route as needed. 1 each 0     Review of Systems  Constitutional:  Positive for fever. Negative for chills.  Respiratory:  Negative for cough and shortness of breath.   Cardiovascular:  Negative for chest pain and palpitations.  Gastrointestinal:  Negative for nausea and vomiting.  Genitourinary:  Negative for dysuria, frequency and urgency.  Musculoskeletal:  Negative for back pain.  Skin:  Negative for itching and rash.  Neurological:  Negative for dizziness and headaches.  Hematological:  Does not bruise/bleed easily.  Psychiatric/Behavioral:  Negative for depression.   Review of Systems  Constitutional:  Positive for fever. Negative for chills.  Respiratory:  Negative for cough and shortness of breath.   Cardiovascular:  Negative for chest pain and palpitations.  Gastrointestinal:  Negative for nausea and vomiting.  Genitourinary:  Negative for dysuria, frequency and urgency.  Musculoskeletal:  Negative for back pain.  Skin:  Negative for itching and rash.  Neurological:  Negative for dizziness and headaches.  Endo/Heme/Allergies:  Does not bruise/bleed easily.  Psychiatric/Behavioral:  Negative for depression.    Physical Exam   Blood pressure 111/72, pulse (!) 106, temperature 98 F (36.7 C), temperature source Oral, resp. rate 18, height $RemoveBe'5\' 8"'hcgXvwAOa$  (1.727 m), weight 73.9 kg, last menstrual period 12/01/2020, SpO2 99 %.  Physical Exam Constitutional:      Appearance: Normal appearance.  HENT:     Head: Normocephalic and atraumatic.  Cardiovascular:     Rate and Rhythm: Normal rate and regular rhythm.  Pulmonary:     Effort: Pulmonary effort is normal.     Breath sounds: Normal breath sounds.  Genitourinary:    General: Normal vulva.     Vagina: Vaginal discharge present.     Comments: SVE 0/0/-3, posterior, soft.  Small amount of grayish discharge noted at perineum.   Musculoskeletal:        General: Normal range of  motion.  Skin:    General: Skin is warm and dry.  Neurological:     Mental Status: She is alert and oriented to person, place, and time.  Psychiatric:        Mood and Affect: Mood normal.        Behavior: Behavior normal.        Thought Content: Thought content normal.        Judgment: Judgment normal.    Fetal Assessment 130 bpm, Mod Var, -Decels, +Accels Toco: irregular  MAU Course   Results for orders placed or performed during the hospital encounter of 08/03/21 (from the past 24 hour(s))  Wet prep, genital     Status: Abnormal   Collection Time: 08/03/21  4:48 PM  Result Value Ref Range   Yeast Wet Prep HPF POC NONE SEEN NONE SEEN   Trich, Wet Prep NONE SEEN NONE SEEN   Clue Cells Wet Prep HPF POC NONE SEEN NONE SEEN   WBC, Wet Prep HPF POC >=10 (A) <10   Sperm NONE SEEN   Urinalysis, Routine w reflex microscopic     Status: Abnormal   Collection Time: 08/03/21  5:21 PM  Result Value Ref Range   Color, Urine AMBER (A) YELLOW   APPearance HAZY (A) CLEAR   Specific Gravity, Urine 1.030 1.005 - 1.030   pH 5.0 5.0 - 8.0   Glucose, UA NEGATIVE NEGATIVE mg/dL   Hgb urine dipstick NEGATIVE NEGATIVE   Bilirubin Urine NEGATIVE NEGATIVE   Ketones, ur 5 (A) NEGATIVE mg/dL   Protein, ur 30 (A) NEGATIVE mg/dL   Nitrite NEGATIVE NEGATIVE   Leukocytes,Ua NEGATIVE NEGATIVE   RBC / HPF 0-5 0 - 5 RBC/hpf   WBC, UA 0-5 0 - 5 WBC/hpf   Bacteria, UA RARE (A) NONE SEEN   Squamous Epithelial / LPF 0-5 0 - 5   Mucus PRESENT    [place] MFM FETAL BPP WO NON STRESS  Result Date: 08/03/2021 ----------------------------------------------------------------------  OBSTETRICS REPORT                       (Signed Final 08/03/2021 08:04 pm) ---------------------------------------------------------------------- Patient Info  ID #:       [id number]                          D.O.B.:  1985/08/17 (35 yrs)  Name:       Nicole Gallegos                Visit Date: 08/03/2021 06:49 pm  ---------------------------------------------------------------------- Performed By  Attending:        Tama High MD        Ref. Address:      [address]                                                              [place], [place]  Performed  By:     Rodrigo Ran BS      [facility]                                  [place]  Referred By:      [facility] ---------------------------------------------------------------------- Orders  #  Description                           Code        Ordered By  1  [place] MFM FETAL BPP WO NON               76819.01    Deyci Gesell     STRESS ----------------------------------------------------------------------  #  Order #                     Accession #                Episode #  1  [id number]                   [id number]                 [id number] ---------------------------------------------------------------------- Indications  [redacted] weeks gestation of pregnancy                 Z3A.34  Decreased fetal movements, third trimester,     O36.8130  unspecified  Seizure disorder (no meds)                      O99.350 G40.909  Poor obstetric history: Previous preeclampsia   O09.299  Poor obstetric history: Previous preterm        O09.219  delivery, antepartum (36+1 weeks)  Echogenic intracardiac focus of the heart       O35.8XX0  (EIF) ---------------------------------------------------------------------- Fetal Evaluation  Num Of Fetuses:          1  Fetal Heart Rate(bpm):   130  Cardiac Activity:        Observed  Presentation:            Cephalic  Placenta:                Anterior  Amniotic Fluid  AFI FV:      Within normal limits  AFI Sum(cm)     %Tile       Largest Pocket(cm)  9               13          3.1  RUQ(cm)       RLQ(cm)       LUQ(cm)        LLQ(cm)  2.2           1.7           2              3.1  ---------------------------------------------------------------------- Biophysical Evaluation  Amniotic F.V:   Within normal limits       F. Tone:         Observed  F.  Movement:    Observed                   Score:           8/8  F. Breathing:   Observed ---------------------------------------------------------------------- Gestational Age  Best:          34w 6d     Det. By:  Loman Chroman         EDD:   09/08/21                                      (01/18/21) ---------------------------------------------------------------------- Anatomy  Diaphragm:             Appears normal         Kidneys:                Appear normal  Stomach:               Appears normal, left   Bladder:                Appears normal                         sided ---------------------------------------------------------------------- Impression  Patient was evaluated for c/o decreased fetal movements .  Amniotic fluid is normal and good fetal activity is seen  .Antenatal testing is reassuring. BPP 8/8. Cephalic  presentation. ----------------------------------------------------------------------                  Tama High, MD Electronically Signed Final Report   08/03/2021 08:04 pm ----------------------------------------------------------------------   MDM PE: Vaginal discharge: GC/CT:pending. Wet prep: negative.  EFM: Reactive NST with BPP 10/10. Vaginal pressure: PO fluid hydration tolerated, SVE: C/T/H on admission.   Assessment and Plan  Nicole Gallegos is a 36 yo [id number] @ 35w presents to MAU for 2 days of decreased fetal movement and vaginal pressure.  Decreased fetal movement:  -SIUP at 35weeks -BPP 8/8 -NST Reactive -Encouraged continued PO hydration, reviewed urine results-negative for UTI.  -Fetal Kick Counts reviewed, pt if decreased patient to call office or return to [place] MSN, CNM 08/03/2021, 4:35 PM   Attestation of Supervision of Student:  I confirm that I have verified the information documented in  the nurse midwife students note and that I have also personally reperformed the history, physical exam and all medical decision making activities.  I have verified that all services and findings are accurately documented in this student's note; and I agree with management and plan as outlined in the documentation. I have also made any necessary editorial changes.  -BPP formal report 8/8 -Discharged to home with precautions.  -Follow up as appropriate and scheduled.   Maryann Conners, [place] for [employer], [employer] 08/03/2021 8:16 PM

## 2021-08-04 LAB — GC/CHLAMYDIA PROBE AMP (~~LOC~~) NOT AT ARMC
Chlamydia: NEGATIVE
Comment: NEGATIVE
Comment: NORMAL
Neisseria Gonorrhea: NEGATIVE

## 2021-08-05 LAB — CULTURE, OB URINE: Culture: 40000 — AB

## 2021-08-06 ENCOUNTER — Ambulatory Visit (INDEPENDENT_AMBULATORY_CARE_PROVIDER_SITE_OTHER): Payer: Medicaid Other | Admitting: Medical

## 2021-08-06 ENCOUNTER — Ambulatory Visit: Payer: Medicaid Other | Attending: Obstetrics and Gynecology

## 2021-08-06 ENCOUNTER — Ambulatory Visit: Payer: Medicaid Other | Admitting: *Deleted

## 2021-08-06 ENCOUNTER — Encounter: Payer: Self-pay | Admitting: Medical

## 2021-08-06 ENCOUNTER — Other Ambulatory Visit: Payer: Self-pay

## 2021-08-06 VITALS — BP 104/71 | HR 101

## 2021-08-06 VITALS — BP 112/78 | HR 98 | Wt 161.0 lb

## 2021-08-06 DIAGNOSIS — O09293 Supervision of pregnancy with other poor reproductive or obstetric history, third trimester: Secondary | ICD-10-CM | POA: Insufficient documentation

## 2021-08-06 DIAGNOSIS — O0990 Supervision of high risk pregnancy, unspecified, unspecified trimester: Secondary | ICD-10-CM

## 2021-08-06 DIAGNOSIS — O09899 Supervision of other high risk pregnancies, unspecified trimester: Secondary | ICD-10-CM

## 2021-08-06 DIAGNOSIS — F32A Depression, unspecified: Secondary | ICD-10-CM

## 2021-08-06 DIAGNOSIS — O09893 Supervision of other high risk pregnancies, third trimester: Secondary | ICD-10-CM | POA: Insufficient documentation

## 2021-08-06 DIAGNOSIS — Z3A35 35 weeks gestation of pregnancy: Secondary | ICD-10-CM

## 2021-08-06 DIAGNOSIS — O283 Abnormal ultrasonic finding on antenatal screening of mother: Secondary | ICD-10-CM | POA: Diagnosis not present

## 2021-08-06 DIAGNOSIS — R102 Pelvic and perineal pain: Secondary | ICD-10-CM

## 2021-08-06 DIAGNOSIS — O99513 Diseases of the respiratory system complicating pregnancy, third trimester: Secondary | ICD-10-CM

## 2021-08-06 DIAGNOSIS — G40909 Epilepsy, unspecified, not intractable, without status epilepticus: Secondary | ICD-10-CM

## 2021-08-06 DIAGNOSIS — F419 Anxiety disorder, unspecified: Secondary | ICD-10-CM

## 2021-08-06 DIAGNOSIS — Z9889 Other specified postprocedural states: Secondary | ICD-10-CM

## 2021-08-06 DIAGNOSIS — O26899 Other specified pregnancy related conditions, unspecified trimester: Secondary | ICD-10-CM

## 2021-08-06 DIAGNOSIS — O99351 Diseases of the nervous system complicating pregnancy, first trimester: Secondary | ICD-10-CM

## 2021-08-06 DIAGNOSIS — Z8759 Personal history of other complications of pregnancy, childbirth and the puerperium: Secondary | ICD-10-CM

## 2021-08-06 DIAGNOSIS — O26893 Other specified pregnancy related conditions, third trimester: Secondary | ICD-10-CM

## 2021-08-06 DIAGNOSIS — J45909 Unspecified asthma, uncomplicated: Secondary | ICD-10-CM

## 2021-08-06 NOTE — Progress Notes (Signed)
° °  PRENATAL VISIT NOTE  Subjective:  Nicole Gallegos is a 36 y.o. [id number] at [redacted]w[redacted]d being seen today for ongoing prenatal care.  She is currently monitored for the following issues for this high-risk pregnancy and has Anxiety and depression; Asthma complicating pregnancy in third trimester; Seizure disorder during pregnancy in first trimester ([place]); Tension headache, chronic; Migraine; History of eclampsia; Supervision of high risk pregnancy, antepartum; History of reversal of tubal ligation; History of preterm delivery, currently pregnant; and Syncope on their problem list.  Patient reports fatigue.  Contractions: Irritability. Vag. Bleeding: None.  Movement: Present. Denies leaking of fluid.   The following portions of the patient's history were reviewed and updated as appropriate: allergies, current medications, past family history, past medical history, past social history, past surgical history and problem list.   Objective:   Vitals:   08/06/21 1102  BP: 112/78  Pulse: 98  Weight: 161 lb (73 kg)    Fetal Status: Fetal Heart Rate (bpm): 136 Fundal Height: 37 cm Movement: Present     General:  Alert, oriented and cooperative. Patient is in no acute distress.  Skin: Skin is warm and dry. No rash noted.   Cardiovascular: Normal heart rate noted  Respiratory: Normal respiratory effort, no problems with respiration noted  Abdomen: Soft, gravid, appropriate for gestational age.  Pain/Pressure: Present     Pelvic: Cervical exam deferred        Extremities: Normal range of motion.  Edema: None  Mental Status: Normal mood and affect. Normal behavior. Normal judgment and thought content.   Assessment and Plan:  Pregnancy: [id number] at [redacted]w[redacted]d 1. 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 History of reversal of tubal ligation  3. History of preterm delivery, currently pregnant - at 36 weeks, BH contractions noted recently only   4. History of eclampsia - Normotensive today  - BASA  5. Seizure disorder during pregnancy in first trimester ([place])  6. Asthma complicating pregnancy in third trimester  7. Anxiety and depression  8. Pain of round ligament affecting pregnancy, antepartum - Discussed hydrotherapy, belly band and tylenol PRN for pain   9. Pelvic pressure in pregnancy, antepartum, third trimester  10. [redacted] weeks gestation of pregnancy  Preterm labor symptoms and general obstetric precautions including but not limited to vaginal bleeding, contractions, leaking of fluid and fetal movement were reviewed in detail with the patient. Please refer to After Visit Summary for other counseling recommendations.   Return in about 1 week (around 08/13/2021) for LOB, In-Person.  Future Appointments  Date Time Provider [place]  08/06/2021  3:30 PM [facility] NURSE [facility]  08/06/2021  3:45 PM [facility] US5 [facility]  09/02/2021  8:20 AM Tobb, Godfrey Pick, DO [facility]    Kerry Hough, PA-C

## 2021-08-13 ENCOUNTER — Other Ambulatory Visit (HOSPITAL_COMMUNITY)
Admission: RE | Admit: 2021-08-13 | Discharge: 2021-08-13 | Disposition: A | Discharge status: Home / Self Care | Payer: Medicaid Other | Source: Ambulatory Visit | Attending: Obstetrics and Gynecology | Admitting: Obstetrics and Gynecology

## 2021-08-13 ENCOUNTER — Ambulatory Visit (INDEPENDENT_AMBULATORY_CARE_PROVIDER_SITE_OTHER): Payer: Medicaid Other | Admitting: Obstetrics and Gynecology

## 2021-08-13 ENCOUNTER — Other Ambulatory Visit: Payer: Self-pay

## 2021-08-13 VITALS — BP 110/70 | HR 93 | Wt 159.6 lb

## 2021-08-13 DIAGNOSIS — O0990 Supervision of high risk pregnancy, unspecified, unspecified trimester: Secondary | ICD-10-CM | POA: Diagnosis present

## 2021-08-13 DIAGNOSIS — Z3A36 36 weeks gestation of pregnancy: Secondary | ICD-10-CM

## 2021-08-13 LAB — OB RESULTS CONSOLE GC/CHLAMYDIA: Gonorrhea: NEGATIVE

## 2021-08-13 NOTE — Progress Notes (Signed)
° ° °  PRENATAL VISIT NOTE  Subjective:  Nicole Gallegos is a 36 y.o. [id number] at [redacted]w[redacted]d being seen today for ongoing prenatal care.  She is currently monitored for the following issues for this high-risk pregnancy and has Anxiety and depression; Asthma complicating pregnancy in third trimester; Seizure disorder during pregnancy in first trimester ([place]); Tension headache, chronic; Migraine; History of eclampsia; Supervision of high risk pregnancy, antepartum; History of reversal of tubal ligation; History of preterm delivery, currently pregnant; and Syncope on their problem list.  Patient reports no complaints.  Contractions: Not present. Vag. Bleeding: None.  Movement: Present. Denies leaking of fluid.   The following portions of the patient's history were reviewed and updated as appropriate: allergies, current medications, past family history, past medical history, past social history, past surgical history and problem list.   Objective:   Vitals:   08/13/21 0827  BP: 110/70  Pulse: 93  Weight: 159 lb 9.6 oz (72.4 kg)    Fetal Status: Fetal Heart Rate (bpm): 145   Movement: Present  Presentation: Vertex  General:  Alert, oriented and cooperative. Patient is in no acute distress.  Skin: Skin is warm and dry. No rash noted.   Cardiovascular: Normal heart rate noted  Respiratory: Normal respiratory effort, no problems with respiration noted  Abdomen: Soft, gravid, appropriate for gestational age.  Pain/Pressure: Absent     Pelvic: Cervical exam performed in the presence of a chaperone Dilation: 1.5 Effacement (%): 50 Station: -3  Extremities: Normal range of motion.  Edema: None  Mental Status: Normal mood and affect. Normal behavior. Normal judgment and thought content.   Assessment and Plan:  Pregnancy: [id number] at [redacted]w[redacted]d 1. Supervision of high risk pregnancy, antepartum Desires 39wk iol. Pt to let us know nv when to schedule it for. Continue aspirin for h/o pp eclampsia. Continue flovent  for h/o ashtma - GC/Chlamydia probe amp ([place])not at [facility] - Culture, beta strep (group b only)  Preterm labor symptoms and general obstetric precautions including but not limited to vaginal bleeding, contractions, leaking of fluid and fetal movement were reviewed in detail with the patient. Please refer to After Visit Summary for other counseling recommendations.   No follow-ups on file.  Future Appointments  Date Time Provider [place]  08/23/2021  1:15 PM Aletha Halim, MD [facility]  09/02/2021  8:20 AM Tobb, Godfrey Pick, DO [facility]    Aletha Halim, MD

## 2021-08-16 LAB — GC/CHLAMYDIA PROBE AMP (~~LOC~~) NOT AT ARMC
Chlamydia: NEGATIVE
Comment: NEGATIVE
Comment: NORMAL
Neisseria Gonorrhea: NEGATIVE

## 2021-08-17 DIAGNOSIS — B951 Streptococcus, group B, as the cause of diseases classified elsewhere: Secondary | ICD-10-CM | POA: Insufficient documentation

## 2021-08-17 LAB — CULTURE, BETA STREP (GROUP B ONLY): Strep Gp B Culture: POSITIVE — AB

## 2021-08-23 ENCOUNTER — Telehealth (HOSPITAL_COMMUNITY): Payer: Self-pay | Admitting: *Deleted

## 2021-08-23 ENCOUNTER — Ambulatory Visit (INDEPENDENT_AMBULATORY_CARE_PROVIDER_SITE_OTHER): Payer: Medicaid Other | Admitting: Obstetrics and Gynecology

## 2021-08-23 ENCOUNTER — Encounter (HOSPITAL_COMMUNITY): Payer: Self-pay | Admitting: *Deleted

## 2021-08-23 ENCOUNTER — Other Ambulatory Visit: Payer: Self-pay

## 2021-08-23 VITALS — BP 112/67 | HR 92 | Wt 161.4 lb

## 2021-08-23 DIAGNOSIS — Z3A37 37 weeks gestation of pregnancy: Secondary | ICD-10-CM

## 2021-08-23 DIAGNOSIS — B951 Streptococcus, group B, as the cause of diseases classified elsewhere: Secondary | ICD-10-CM

## 2021-08-23 DIAGNOSIS — Z3490 Encounter for supervision of normal pregnancy, unspecified, unspecified trimester: Secondary | ICD-10-CM

## 2021-08-23 NOTE — Progress Notes (Signed)
° °  PRENATAL VISIT NOTE  Subjective:  Nicole Gallegos is a 36 y.o. [id number] at [redacted]w[redacted]d being seen today for ongoing prenatal care.  She is currently monitored for the following issues for this high-risk pregnancy and has Anxiety and depression; Asthma complicating pregnancy in third trimester; History of seizure disorder; Tension headache, chronic; Migraine; History of eclampsia; Supervision of high risk pregnancy, antepartum; History of reversal of tubal ligation; History of preterm delivery, currently pregnant; Syncope; and Group beta Strep positive on their problem list.  Patient reports no complaints.  Contractions: Irritability. Vag. Bleeding: None.  Movement: Present. Denies leaking of fluid.   The following portions of the patient's history were reviewed and updated as appropriate: allergies, current medications, past family history, past medical history, past social history, past surgical history and problem list.   Objective:   Vitals:   08/23/21 1325  BP: 112/67  Pulse: 92  Weight: 161 lb 6.4 oz (73.2 kg)    Fetal Status: Fetal Heart Rate (bpm): 146 Fundal Height: 37 cm Movement: Present  Presentation: Vertex  General:  Alert, oriented and cooperative. Patient is in no acute distress.  Skin: Skin is warm and dry. No rash noted.   Cardiovascular: Normal heart rate noted  Respiratory: Normal respiratory effort, no problems with respiration noted  Abdomen: Soft, gravid, appropriate for gestational age.  Pain/Pressure: Present     Pelvic: Cervical exam deferred        Extremities: Normal range of motion.  Edema: None  Mental Status: Normal mood and affect. Normal behavior. Normal judgment and thought content.   Assessment and Plan:  Pregnancy: [id number] at [redacted]w[redacted]d 1. [redacted] weeks gestation of pregnancy Pt desires 39wk, IOL for childcare reasons>>pt set up for 2345 on 2/20 Pt undecided on birth control  2. Group beta Strep positive Treat in labor  3. H/o seizure Seen by neuro and felt  to be syncopal/Vasovagal related. No further work up. Late 2022 cardiac work up negative.   Term labor symptoms and general obstetric precautions including but not limited to vaginal bleeding, contractions, leaking of fluid and fetal movement were reviewed in detail with the patient. Please refer to After Visit Summary for other counseling recommendations.   Return in 1 week (on 08/30/2021) for in person, md or app, low risk ob.  Future Appointments  Date Time Provider [place]  08/31/2021 12:00 AM [facility] SCHED ROOM [facility] None  09/02/2021  8:20 AM Tobb, Godfrey Pick, DO [facility]    Aletha Halim, MD

## 2021-08-23 NOTE — Telephone Encounter (Signed)
Preadmission screen  

## 2021-08-23 NOTE — Patient Instructions (Signed)
Come to the hospital at 1145pm on Monday, February 20.

## 2021-08-24 ENCOUNTER — Other Ambulatory Visit: Payer: Self-pay | Admitting: Obstetrics and Gynecology

## 2021-08-25 ENCOUNTER — Other Ambulatory Visit: Payer: Self-pay | Admitting: Advanced Practice Midwife

## 2021-08-29 ENCOUNTER — Inpatient Hospital Stay (HOSPITAL_COMMUNITY)
Admission: AD | Admit: 2021-08-29 | Discharge: 2021-08-31 | DRG: 807 | Disposition: A | Discharge status: Home / Self Care | Payer: Medicaid Other | Attending: Obstetrics and Gynecology | Admitting: Obstetrics and Gynecology

## 2021-08-29 ENCOUNTER — Other Ambulatory Visit: Payer: Self-pay

## 2021-08-29 ENCOUNTER — Encounter (HOSPITAL_COMMUNITY): Payer: Self-pay | Admitting: Obstetrics and Gynecology

## 2021-08-29 DIAGNOSIS — Z8759 Personal history of other complications of pregnancy, childbirth and the puerperium: Secondary | ICD-10-CM

## 2021-08-29 DIAGNOSIS — Z3A38 38 weeks gestation of pregnancy: Secondary | ICD-10-CM

## 2021-08-29 DIAGNOSIS — Z20822 Contact with and (suspected) exposure to covid-19: Secondary | ICD-10-CM | POA: Diagnosis present

## 2021-08-29 DIAGNOSIS — O9952 Diseases of the respiratory system complicating childbirth: Secondary | ICD-10-CM | POA: Diagnosis present

## 2021-08-29 DIAGNOSIS — O26893 Other specified pregnancy related conditions, third trimester: Secondary | ICD-10-CM | POA: Diagnosis present

## 2021-08-29 DIAGNOSIS — O99824 Streptococcus B carrier state complicating childbirth: Secondary | ICD-10-CM | POA: Diagnosis present

## 2021-08-29 DIAGNOSIS — O0990 Supervision of high risk pregnancy, unspecified, unspecified trimester: Secondary | ICD-10-CM

## 2021-08-29 DIAGNOSIS — F419 Anxiety disorder, unspecified: Secondary | ICD-10-CM | POA: Diagnosis present

## 2021-08-29 DIAGNOSIS — J45909 Unspecified asthma, uncomplicated: Secondary | ICD-10-CM | POA: Diagnosis present

## 2021-08-29 DIAGNOSIS — Z8669 Personal history of other diseases of the nervous system and sense organs: Secondary | ICD-10-CM

## 2021-08-29 DIAGNOSIS — B951 Streptococcus, group B, as the cause of diseases classified elsewhere: Secondary | ICD-10-CM | POA: Diagnosis present

## 2021-08-29 LAB — COMPREHENSIVE METABOLIC PANEL
ALT: 10 U/L (ref 0–44)
AST: 17 U/L (ref 15–41)
Albumin: 2.7 g/dL — ABNORMAL LOW (ref 3.5–5.0)
Alkaline Phosphatase: 113 U/L (ref 38–126)
Anion gap: 9 (ref 5–15)
BUN: 6 mg/dL (ref 6–20)
CO2: 21 mmol/L — ABNORMAL LOW (ref 22–32)
Calcium: 9.3 mg/dL (ref 8.9–10.3)
Chloride: 105 mmol/L (ref 98–111)
Creatinine, Ser: 0.63 mg/dL (ref 0.44–1.00)
GFR, Estimated: >60 mL/min (ref >60)
Glucose, Bld: 86 mg/dL (ref 70–99)
Potassium: 4.1 mmol/L (ref 3.5–5.1)
Sodium: 135 mmol/L (ref 135–145)
Total Bilirubin: 0.5 mg/dL (ref 0.3–1.2)
Total Protein: 5.9 g/dL — ABNORMAL LOW (ref 6.5–8.1)

## 2021-08-29 LAB — TYPE AND SCREEN
ABO/RH(D): AB POS
Antibody Screen: NEGATIVE

## 2021-08-29 LAB — CBC
HCT: 37.1 % (ref 36.0–46.0)
Hemoglobin: 11 g/dL — ABNORMAL LOW (ref 12.0–15.0)
MCH: 23.2 pg — ABNORMAL LOW (ref 26.0–34.0)
MCHC: 29.6 g/dL — ABNORMAL LOW (ref 30.0–36.0)
MCV: 78.1 fL — ABNORMAL LOW (ref 80.0–100.0)
Platelets: 233 10*3/uL (ref 150–400)
RBC: 4.75 MIL/uL (ref 3.87–5.11)
RDW: 16.2 % — ABNORMAL HIGH (ref 11.5–15.5)
WBC: 6.9 10*3/uL (ref 4.0–10.5)
nRBC: 0.4 % — ABNORMAL HIGH (ref 0.0–0.2)

## 2021-08-29 LAB — RESP PANEL BY RT-PCR (FLU A&B, COVID) ARPGX2
Influenza A by PCR: NEGATIVE
Influenza B by PCR: NEGATIVE
SARS Coronavirus 2 by RT PCR: NEGATIVE

## 2021-08-29 LAB — POCT FERN TEST: POCT Fern Test: POSITIVE

## 2021-08-29 MED ORDER — LACTATED RINGERS IV SOLN: INTRAVENOUS | Status: DC

## 2021-08-29 MED ORDER — SOD CITRATE-CITRIC ACID 500-334 MG/5ML PO SOLN: 30.0000 mL | ORAL | Status: DC | PRN

## 2021-08-29 MED ORDER — SODIUM CHLORIDE 0.9 % IV SOLN: 1.0000 g | INTRAVENOUS | Status: DC

## 2021-08-29 MED ORDER — FENTANYL CITRATE (PF) 100 MCG/2ML IJ SOLN: 50.0000 ug | INTRAMUSCULAR | Status: DC | PRN

## 2021-08-29 MED ORDER — LIDOCAINE HCL (PF) 1 % IJ SOLN: 30.0000 mL | INTRAMUSCULAR | Status: DC | PRN

## 2021-08-29 MED ORDER — OXYTOCIN-SODIUM CHLORIDE 30-0.9 UT/500ML-% IV SOLN
2.5000 [IU]/h | INTRAVENOUS | Status: DC
  Filled 2021-08-29: qty 500

## 2021-08-29 MED ORDER — FENTANYL CITRATE (PF) 100 MCG/2ML IJ SOLN
100.0000 ug | Freq: Once | INTRAMUSCULAR | Status: AC
  Administered 2021-08-29: 100 ug via INTRAVENOUS

## 2021-08-29 MED ORDER — SODIUM CHLORIDE 0.9 % IV SOLN: 2.0000 g | Freq: Once | INTRAVENOUS | Status: DC

## 2021-08-29 MED ORDER — MISOPROSTOL 25 MCG QUARTER TABLET
25.0000 ug | ORAL_TABLET | ORAL | Status: DC | PRN
Start: 2021-08-29 — End: 2021-08-30

## 2021-08-29 MED ORDER — OXYTOCIN BOLUS FROM INFUSION
333.0000 mL | Freq: Once | INTRAVENOUS | Status: AC
  Administered 2021-08-29: 333 mL via INTRAVENOUS

## 2021-08-29 MED ORDER — TERBUTALINE SULFATE 1 MG/ML IJ SOLN: 0.2500 mg | Freq: Once | INTRAMUSCULAR | Status: DC | PRN

## 2021-08-29 MED ORDER — LACTATED RINGERS IV SOLN: 500.0000 mL | INTRAVENOUS | Status: DC | PRN

## 2021-08-29 MED ORDER — ONDANSETRON HCL 4 MG/2ML IJ SOLN: 4.0000 mg | Freq: Four times a day (QID) | INTRAMUSCULAR | Status: DC | PRN

## 2021-08-29 MED ORDER — ACETAMINOPHEN 325 MG PO TABS
650.0000 mg | ORAL_TABLET | ORAL | Status: DC | PRN
Start: 2021-08-29 — End: 2021-08-30

## 2021-08-29 MED ORDER — FENTANYL CITRATE (PF) 100 MCG/2ML IJ SOLN
INTRAMUSCULAR | Status: AC
  Filled 2021-08-29: qty 2
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
## 2021-08-29 NOTE — Discharge Summary (Signed)
Postpartum Discharge Summary   Patient Name: Nicole Gallegos DOB: 04/25/1986 MRN: [id number]  Date of admission: 08/29/2021 Delivery date:08/29/2021  Delivering provider: Renee Harder  Date of discharge: 08/31/2021  Admitting diagnosis: Supervision of high risk pregnancy in third trimester [O09.93] Intrauterine pregnancy: [redacted]w[redacted]d    Secondary diagnosis:  Principal Problem:   SVD (spontaneous vaginal delivery) Active Problems:   Anxiety and depression   Asthma complicating pregnancy in third trimester   History of seizure disorder   History of eclampsia   Supervision of high risk pregnancy, antepartum   Group beta Strep positive  Additional problems: None     Discharge diagnosis: Term Pregnancy Delivered                                              Post partum procedures: None Augmentation: N/A Complications: None  Hospital course: Onset of Labor With Vaginal Delivery      36y.o. yo [id number] 315w5das admitted in Active Labor on 08/29/2021. Patient had an uncomplicated labor course as follows:  Membrane Rupture Time/Date: 9:40 PM ,08/29/2021   Delivery Method:Vaginal, Spontaneous  Episiotomy: None  Lacerations:  None  Patient had an uncomplicated postpartum course.  She is ambulating, tolerating a regular diet, passing flatus, and urinating well.  Patient is discharged home in stable condition on 08/31/21.  Newborn Data: Birth date:08/29/2021  Birth time:10:42 PM  Gender:Female  Living status:Living  Apgars:8 ,9  Weight:3290 g   Magnesium Sulfate received: No BMZ received: No Rhophylac: N/A MMR: N/A T-DaP: Given prenatally Flu: N/A Transfusion: No  Physical exam  Vitals:   08/30/21 0615 08/30/21 1008 08/30/21 1430 08/30/21 2203  BP: 121/82 96/63 101/72 108/68  Pulse: 77 (!) 104 (!) 101 86  Resp: _0 Temp: 98.7 F (37.1 C) 98.6 F (37 C) 98 F (36.7 C) 98.5 F (36.9 C)  TempSrc: Oral Oral Oral Axillary  SpO2: 100% 100% 100%   Weight:       Height:       General: alert, cooperative, and no distress Lochia: appropriate Uterine Fundus: firm and below umbilicus  DVT Evaluation: no LE edema or calf tenderness to palpation   Labs: Lab Results  Component Value Date   WBC 6.9 08/29/2021   HGB 11.0 (L) 08/29/2021   HCT 37.1 08/29/2021   MCV 78.1 (L) 08/29/2021   PLT 233 08/29/2021   CMP Latest Ref Rng & Units 08/29/2021  Glucose 70 - 99 mg/dL 86  BUN 6 - 20 mg/dL 6  Creatinine 0.44 - 1.00 mg/dL 0.63  Sodium 135 - 145 mmol/L 135  Potassium 3.5 - 5.1 mmol/L 4.1  Chloride 98 - 111 mmol/L 105  CO2 22 - 32 mmol/L 21(L)  Calcium 8.9 - 10.3 mg/dL 9.3  Total Protein 6.5 - 8.1 g/dL 5.9(L)  Total Bilirubin 0.3 - 1.2 mg/dL 0.5  Alkaline Phos 38 - 126 U/L 113  AST 15 - 41 U/L 17  ALT 0 - 44 U/L 10   Edinburgh Score: Edinburgh Postnatal Depression Scale Screening Tool 08/30/2021  I have been able to laugh and see the funny side of things. 0  I have looked forward with enjoyment to things. 0  I have blamed myself unnecessarily when things went wrong. 0  I have been anxious or worried for no good reason. 0  I have  felt scared or panicky for no good reason. 0  Things have been getting on top of me. 0  I have been so unhappy that I have had difficulty sleeping. 0  I have felt sad or miserable. 0  I have been so unhappy that I have been crying. 0  The thought of harming myself has occurred to me. 0  Edinburgh Postnatal Depression Scale Total 0     After visit meds:  Allergies as of 08/31/2021       Reactions   Chocolate Hives        Medication List     STOP taking these medications    aspirin EC 81 MG tablet   docusate sodium 100 MG capsule Commonly known as: Colace   Gojji Weight Scale Misc       TAKE these medications    acetaminophen 500 MG tablet Commonly known as: TYLENOL Take 2 tablets (1,000 mg total) by mouth every 8 (eight) hours as needed (pain).   Blood Pressure Kit Devi 1 Device by Does not  apply route as needed.   fluticasone 110 MCG/ACT inhaler Commonly known as: Flovent HFA Inhale 1 puff into the lungs in the morning and at bedtime.   ibuprofen 600 MG tablet Commonly known as: ADVIL Take 1 tablet (600 mg total) by mouth every 6 (six) hours as needed.   montelukast 10 MG tablet Commonly known as: Singulair Take 1 tablet (10 mg total) by mouth at bedtime.   multivitamin-prenatal 27-0.8 MG Tabs tablet Take 1 tablet by mouth daily at 12 noon.         Discharge home in stable condition Infant Feeding: Bottle and Breast Infant Disposition: home with mother Discharge instruction: per After Visit Summary and Postpartum booklet. Activity: Advance as tolerated. Pelvic rest for 6 weeks.  Diet: routine diet Future Appointments: Future Appointments  Date Time Provider [place]  09/02/2021  8:20 AM Berniece Salines, DO [facility]  10/05/2021  2:35 PM Donnamae Jude, MD [facility]   Follow up Visit: Please schedule this patient for a In person postpartum visit in 6 weeks with the following provider: Any provider. Additional Postpartum F/U: None   High risk pregnancy complicated by:  History of seizure disorder Delivery mode:  Vaginal, Spontaneous  Anticipated Birth Control:  None, declined   08/31/2021 Genia Del, MD

## 2021-08-29 NOTE — MAU Note (Signed)
Pt reports to MAU water breaking at 2140 with ctx every few minutes.   Pt is grossly ruptured with meconium stained fluid.   SVE 5 / 70 / -2

## 2021-08-30 ENCOUNTER — Encounter: Payer: Medicaid Other | Admitting: Obstetrics and Gynecology

## 2021-08-30 ENCOUNTER — Other Ambulatory Visit: Payer: Self-pay

## 2021-08-30 ENCOUNTER — Encounter (HOSPITAL_COMMUNITY): Payer: Self-pay | Admitting: Obstetrics and Gynecology

## 2021-08-30 LAB — RPR: RPR Ser Ql: NONREACTIVE

## 2021-08-30 MED ORDER — DIPHENHYDRAMINE HCL 25 MG PO CAPS: 25.0000 mg | ORAL_CAPSULE | Freq: Four times a day (QID) | ORAL | Status: DC | PRN

## 2021-08-30 MED ORDER — ONDANSETRON HCL 4 MG/2ML IJ SOLN: 4.0000 mg | INTRAMUSCULAR | Status: DC | PRN

## 2021-08-30 MED ORDER — ZOLPIDEM TARTRATE 5 MG PO TABS: 5.0000 mg | ORAL_TABLET | Freq: Every evening | ORAL | Status: DC | PRN

## 2021-08-30 MED ORDER — COCONUT OIL OIL
1.0000 | TOPICAL_OIL | Status: DC | PRN
Start: 2021-08-30 — End: 2021-08-31

## 2021-08-30 MED ORDER — ONDANSETRON HCL 4 MG PO TABS: 4.0000 mg | ORAL_TABLET | ORAL | Status: DC | PRN

## 2021-08-30 MED ORDER — SENNOSIDES-DOCUSATE SODIUM 8.6-50 MG PO TABS
2.0000 | ORAL_TABLET | Freq: Every day | ORAL | Status: DC
  Administered 2021-08-30 – 2021-08-31 (×2): 2 via ORAL
  Filled 2021-08-30 (×2): qty 2

## 2021-08-30 MED ORDER — SIMETHICONE 80 MG PO CHEW: 80.0000 mg | CHEWABLE_TABLET | ORAL | Status: DC | PRN

## 2021-08-30 MED ORDER — BENZOCAINE-MENTHOL 20-0.5 % EX AERO: 1.0000 "application " | INHALATION_SPRAY | CUTANEOUS | Status: DC | PRN

## 2021-08-30 MED ORDER — DIBUCAINE (PERIANAL) 1 % EX OINT
1.0000 | TOPICAL_OINTMENT | CUTANEOUS | Status: DC | PRN
Start: 2021-08-30 — End: 2021-08-31

## 2021-08-30 MED ORDER — ACETAMINOPHEN 325 MG PO TABS
650.0000 mg | ORAL_TABLET | ORAL | Status: DC | PRN
  Administered 2021-08-30: 650 mg via ORAL
  Filled 2021-08-30: qty 2

## 2021-08-30 MED ORDER — PRENATAL MULTIVITAMIN CH
1.0000 | ORAL_TABLET | Freq: Every day | ORAL | Status: DC
  Administered 2021-08-30 – 2021-08-31 (×2): 1 via ORAL
  Filled 2021-08-30 (×2): qty 1

## 2021-08-30 MED ORDER — TETANUS-DIPHTH-ACELL PERTUSSIS 5-2.5-18.5 LF-MCG/0.5 IM SUSY: 0.5000 mL | PREFILLED_SYRINGE | Freq: Once | INTRAMUSCULAR | Status: DC

## 2021-08-30 MED ORDER — WITCH HAZEL-GLYCERIN EX PADS: 1.0000 "application " | MEDICATED_PAD | CUTANEOUS | Status: DC | PRN

## 2021-08-30 MED ORDER — IBUPROFEN 600 MG PO TABS
600.0000 mg | ORAL_TABLET | Freq: Four times a day (QID) | ORAL | Status: DC
Start: 2021-08-30 — End: 2021-08-31
  Administered 2021-08-30 – 2021-08-31 (×5): 600 mg via ORAL
  Filled 2021-08-30 (×7): qty 1

## 2021-08-30 NOTE — Lactation Note (Signed)
This note was copied from a baby's chart. Lactation Consultation Note  Patient Name: Nicole Gallegos BWLSL'H Date: 08/30/2021 Reason for consult: Follow-up assessment;1st time breastfeeding;Early term 37-38.6wks Age:35 hours LC entered the room, mom had finished feeding infant 15 mls of donor breast milk, LC unable to observe latch at this time. Per mom, she hasn't latch infant due to nipple pain and she felt like infant was chopping at the breast. This is mom's first time breastfeeding, LC did observe areola edema, mom not used the hand pump due to pain. LC gave mom breast shells to wear in bra during the day and not when sleeping.  LC refitted mom with 27 breast flange,good fit, set mom up with DEBP, mom knows to pump every 3 hours for 15 minutes on initial setting. Mom understands breast milk is good at room temperature for 4 hours whereas donor breast is good for 1 hour. Per mom, no pain with 27 mm breast flange, mom was pumping when LC left room and colostrum was being expressed in breast flanges. Mom shown how to use DEBP & how to disassemble, clean, & reassemble parts.  Mom's plan: 1- Mom will call LC for assistance with next latch. 2- Mom will BF infant according hunger cues, 8 to 12+ times within 24 hours, skin to skin. 3- Mom will give back any of her EBM first before supplementing infant with donor breast milk. 4- Mom will continue to pump every 3 hours for 15 minutes on initial setting.  Maternal Data    Feeding Mother's Current Feeding Choice: Breast Milk and Donor Milk Nipple Type: Slow - flow  LATCH Score                    Lactation Tools Discussed/Used Breast pump type: Double-Electric Breast Pump Pump Education: Setup, frequency, and cleaning;Milk Storage Reason for Pumping:  (Mom has not been latching infant at the breast, to help stimulate mom's milk supply.) Pumping frequency: Mom will start pumping every 3 hours for 15 minutes on inital  setting.  Interventions Interventions: Skin to skin;DEBP;Education  Discharge    Consult Status Consult Status: Follow-up Date: 08/31/21 Follow-up type: In-patient    Danelle Earthly 08/30/2021, 4:02 PM

## 2021-08-30 NOTE — Social Work (Signed)
CSW received consult for hx of Anxiety and Depression.  CSW met with [name] to offer support and complete assessment.   ° °CSW met with [name] at bedside and introduced CSW role. CSW observed [name] in bed, the infant asleep in the bassinet and FOB present at bedside. CSW offered [name] privacy. [name] presented calm and welcomed CSW to complete the assessment with FOB present. CSW inquired how [name] how [name] has felt since giving birth. [name] reported feeling "tired, trying to understand breastfeeding." [name] express that the lactation team has been supportive and provided education. CSW provided education about [facility] lactation support. [name] reported that she received a pamphlet about the program and feels comfortable reaching out.  [name] reported her pregnancy felt very long but the L&D went very quickly. CSW inquired about [name] history of anxiety and depression. [name] reported she was diagnosed as a teenager and enrolled in therapy at age 22 as she was tired of feeling depressed and angry. [name] reported therapy was very helpful. [name] reported she was prescribed antidepressants and stopped taking them at age 25.[name] reported she applied her own coping strategies such eating healthier, exercising and got a divorce. CSW encouraged [name] to continue using her coping strategies. CSW discussed PPD. [name] reported she experienced PPD about 16 years ago after the birth of her oldest child. [name] reported that she was young mom and recalls isolating and not allowing her family to be near the infant. [name] reported her mom, sister and grandmother supported her through the PPD and helped her get through it. [name] reported she has her sister and FOB as supports. CSW provided education regarding the baby blues period vs. perinatal mood disorders, discussed treatment and gave resources for mental health follow up if concerns arise.  CSW recommended [name] complete self-evaluation during the postpartum time period using the New Mom Checklist from Postpartum  Progress and encouraged [name] to contact a medical professional if symptoms are noted at any time. [name] reported she feels comfortable reaching out to her provider if concerns arise. [name] denied thoughts of harm to self and others.  °   °[name] reported she has essential items for the infant including a bassinet where the infant will sleep. CSW provided review of Sudden Infant Death Syndrome (SIDS) precautions. [name] has chosen [facility] for the infant's follow up care. CSW assessed [name] for additional needs. [name] reported no further need.  ° °CSW identifies no further need for intervention and no barriers to discharge at this time.  ° °Nicole Gallegos, MSW, LCSW °[facility]  °Clinical Social Worker  °[phone] °08/30/2021  3:30 PM  °

## 2021-08-30 NOTE — Lactation Note (Signed)
This note was copied from a baby's chart. Lactation Consultation Note RN assisted mom in latching baby. LC unavailable at that time d/t another pt. Mom stated it hurts because the baby has a strong suck and it feels like she is chomping and she doesn't know if she will be able to tolerate that. Rt. Nipple does look a little red. Encouraged to apply coconut oil. Hand pump given for pre-pumping to evert Lt. Nipple before latching. Both are compressible. No colostrum noted d/t mom unable to tolerate hand expression even if she is doing it. Breast are tender but soft. Mom didn't BF any of her other children. Mom did want to try to BF this baby. Baby sticks her tongue out past her lips. Baby has thick labial frenulum. Encouraged mom to flange her lips if needed. Encouraged mom to call for latch assistance today for Lactation to see what the baby is doing when on the breast. Reminded mom it might take a couple of days for the baby to figure out how to BF. The baby is learning like she it. Praised mom. Newborn feeding habits, STS, I&O reviewed. Mom encouraged to feed baby 8-12 times/24 hours and with feeding cues.    Patient Name: Nicole Gallegos Today's Date: 08/30/2021 Reason for consult: Initial assessment;Early term 37-38.6wks;1st time breastfeeding Age:15 hours  Maternal Data Has patient been taught Hand Expression?: Yes Does the patient have breastfeeding experience prior to this delivery?: No  Feeding    LATCH Score Latch: Grasps breast easily, tongue down, lips flanged, rhythmical sucking.  Audible Swallowing: Spontaneous and intermittent  Type of Nipple: Inverted (Rt. nipple everted, Lt. nipple inverted but everts well w/stimulation)  Comfort (Breast/Nipple): Filling, red/small blisters or bruises, mild/mod discomfort (nipples are sore)  Hold (Positioning): Assistance needed to correctly position infant at breast and maintain latch.  LATCH Score: 9   Lactation Tools  Discussed/Used Tools: Pump Breast pump type: Manual Pump Education: Setup, frequency, and cleaning;Milk Storage Reason for Pumping: evert Lt. nipple before latching  Interventions Interventions: Breast feeding basics reviewed;Hand express;Pre-pump if needed;Hand pump;Coconut oil  Discharge    Consult Status Consult Status: Follow-up Date: 08/30/21 Follow-up type: In-patient    Charyl Dancer 08/30/2021, 4:48 AM

## 2021-08-30 NOTE — Progress Notes (Addendum)
POSTPARTUM PROGRESS NOTE  Post Partum Day 1  Subjective: Nicole Gallegos is a 36 y.o. [id number] s/p SVD at [redacted]w[redacted]d.  No acute events overnight. Pt denies problems with ambulating, voiding or po intake.  She denies nausea, vomiting, headache, or vision changes.  Pain is well controlled.  She has had flatus. She has not had bowel movement.  Lochia Minimal.   Objective: Blood pressure 121/82, pulse 77, temperature 98.7 F (37.1 C), temperature source Oral, resp. rate 16, height 5\' 8"  (1.727 m), weight 72.6 kg, last menstrual period 12/01/2020, SpO2 100 %, unknown if currently breastfeeding.  Physical Exam:  General: alert, cooperative and no distress Chest: no respiratory distress Heart:regular rate, warm and well perfused Abdomen: soft, nontender Uterine Fundus: firm, appropriately tender Extremities: No LE edema,  no calf swelling or tenderness  Recent Labs    08/29/21 2233  HGB 11.0*  HCT 37.1    Assessment/Plan: Nicole Gallegos is a 36 y.o. 20 s/p SVD at [redacted]w[redacted]d. Pregnancy complicated by h/o eclampsia, h/o seizure disorder, asthma, and anxiety/depression.  PPD#1 - Doing well. Contraception: Declines  Feeding: Breast and bottle feeding. Dispo: Plan for discharge tomorrow. GBS positive without adequate coverage, newborn will stay at least 48 hrs.   #H/o eclampsia: Will continue to monitor BP closely.   LOS: 1 day   [redacted]w[redacted]d, DO PGY-1 08/30/2021, 7:34 AM   Attestation of CNM Supervision of Resident: Evaluation and management procedures were performed by the [facility] Medicine Resident under my supervision. I was immediately available for direct supervision, assistance and direction throughout this encounter.  I also confirm that I have verified the information documented in the residents note, and that I have also personally reperformed the pertinent components of the physical exam and all of the medical decision making activities.  I have also made any necessary editorial  changes.  [facility], CNM 08/30/2021 7:53 AM

## 2021-08-31 ENCOUNTER — Inpatient Hospital Stay (HOSPITAL_COMMUNITY): Admission: AD | Admit: 2021-08-31 | Payer: Medicaid Other | Source: Home / Self Care | Admitting: Family Medicine

## 2021-08-31 ENCOUNTER — Inpatient Hospital Stay (HOSPITAL_COMMUNITY): Payer: Medicaid Other

## 2021-08-31 MED ORDER — IBUPROFEN 600 MG PO TABS: 600.0000 mg | ORAL_TABLET | Freq: Four times a day (QID) | ORAL | 0 refills | Status: DC | PRN

## 2021-08-31 MED ORDER — ACETAMINOPHEN 500 MG PO TABS
1000.0000 mg | ORAL_TABLET | Freq: Three times a day (TID) | ORAL | 0 refills | Status: DC | PRN
Start: 2021-08-31 — End: 2023-08-30

## 2021-08-31 NOTE — Lactation Note (Signed)
This note was copied from a baby's chart. Lactation Consultation Note  Patient Name: Nicole Gallegos Today's Date: 08/31/2021 Reason for consult: Early term 37-38.6wks;1st time breastfeeding;Infant weight loss;Other (Comment);Nipple pain/trauma (6 % weight loss, per mom to sore to latch the baby at the breast and has been pumping with the DEBP with small results. LC reviewed supply and demand / importance of consistent pumping around the clock 8-10 times for 15 -20 mins .) Age:35 hours Per mom the baby was up a lot last night and not satisfied.  LC reviewed the doc flow sheets and noted the baby has only been taking 10 -15 ml. Baby is 17 hours old and LC discussed with  mom the volume feeding goal per feeding and the importance of gradually increasing the volume to stretch the baby's belly so the baby will get hungry to feed more.  LC recommended if the baby is to feed with feeding cues and by 3 hours if not showing signs to get the baby STS so  she is eating around the clock.  LC recommended increasing the volumes up to 30 ml to day.  LC reviewed BF D/C teaching and gave moms some options once her nipples aren't sore to be able to transition back to the breast by 1st giving the baby and appetizer from a bottle and then to the breast.  LC plan :  LC offered to assess breast tissue for soreness. No breakdown noted, just areola edema at the base of both nipples. LC recommended using coconut oil ( a dab ) on the nipple areola prior to pumping until soreness has improved.  In the mean time to establish milk supply - pump both breast for 15 -20 mins ( 8-10 times a day around the clock and save the milk for the feeding)  When soreness has improved - to re-latch may need to give the baby an appetizer of breast milk and then latch.  Feedings are with cues and 8-12 times a day .  Per mom has a DEBP at home.   LC offered to request and LC O/P at the [facility] center and mom receptive.     Maternal Data     Feeding Mother's Current Feeding Choice: Breast Milk and Donor Milk  LATCH Score                    Lactation Tools Discussed/Used Tools: Shells;Flanges Flange Size: 24 Breast pump type: Double-Electric Breast Pump;Manual Pump Education: Milk Storage;Setup, frequency, and cleaning;Other (comment) (LC reviewed set up with mom)  Interventions    Discharge Discharge Education: Engorgement and breast care;Warning signs for feeding baby;Outpatient recommendation;Outpatient Epic message sent;Other (comment) (per mom will be taking the baby to the [facility] center for F/U care and is receptive to se LC for F/U .) Pump: Personal;DEBP;Manual  Consult Status Consult Status: Complete (may be D/C later today) Date: 08/31/21    Nicole Gallegos 08/31/2021, 10:05 AM

## 2021-09-02 ENCOUNTER — Other Ambulatory Visit: Payer: Self-pay

## 2021-09-02 ENCOUNTER — Telehealth (INDEPENDENT_AMBULATORY_CARE_PROVIDER_SITE_OTHER): Payer: Medicaid Other | Admitting: Cardiology

## 2021-09-02 ENCOUNTER — Encounter: Payer: Self-pay | Admitting: Cardiology

## 2021-09-02 ENCOUNTER — Telehealth: Payer: Self-pay

## 2021-09-02 VITALS — BP 114/82 | HR 76 | Ht 68.0 in | Wt 161.0 lb

## 2021-09-02 DIAGNOSIS — Z719 Counseling, unspecified: Secondary | ICD-10-CM | POA: Diagnosis not present

## 2021-09-02 DIAGNOSIS — Z8759 Personal history of other complications of pregnancy, childbirth and the puerperium: Secondary | ICD-10-CM
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
## 2021-09-02 NOTE — Patient Instructions (Signed)
Medication Instructions:  Your physician recommends that you continue on your current medications as directed. Please refer to the Current Medication list given to you today.   Please take your blood pressure daily and record. Send I a MyChart message in 2 weeks, please include pulse.  *If you need a refill on your cardiac medications before your next appointment, please call your pharmacy*   Lab Work: None If you have labs (blood work) drawn today and your tests are completely normal, you will receive your results only by: [place] (if you have MyChart) OR A paper copy in the mail If you have any lab test that is abnormal or we need to change your treatment, we will call you to review the results.   Testing/Procedures: None   Follow-Up: At [facility], you and your health needs are our priority.  As part of our continuing mission to provide you with exceptional heart care, we have created designated Provider Care Teams.  These Care Teams include your primary Cardiologist (physician) and Advanced Practice Providers (APPs -  Physician Assistants and Nurse Practitioners) who all work together to provide you with the care you need, when you need it.  We recommend signing up for the patient portal called "MyChart".  Sign up information is provided on this After Visit Summary.  MyChart is used to connect with patients for Virtual Visits (Telemedicine).  Patients are able to view lab/test results, encounter notes, upcoming appointments, etc.  Non-urgent messages can be sent to your provider as well.   To learn more about what you can do with MyChart, go to [email or web address].    Your next appointment:   First week in may - please call yo make the appt. [phone]  The format for your next appointment:   In Person  Provider:   Berniece Salines, DO     Other Instructions

## 2021-09-02 NOTE — Telephone Encounter (Signed)
Called pt to go over her AVS and set up her follow up appt. She should be scheduled for the first week in May at the [facility] office with Dr. Harriet Masson. No answer at this time, left message for her to return the call.

## 2021-09-07 ENCOUNTER — Other Ambulatory Visit (HOSPITAL_COMMUNITY): Payer: Self-pay

## 2021-09-07 ENCOUNTER — Inpatient Hospital Stay (HOSPITAL_COMMUNITY): Admit: 2021-09-07 | Payer: Medicaid Other | Admitting: Obstetrics & Gynecology

## 2021-09-07 MED ORDER — GABAPENTIN 100 MG PO CAPS
100.0000 mg | ORAL_CAPSULE | Freq: Three times a day (TID) | ORAL | 0 refills | Status: DC | PRN
  Filled 2021-09-07: qty 90, 30d supply, fill #0

## 2021-09-08 ENCOUNTER — Telehealth (HOSPITAL_COMMUNITY): Payer: Self-pay | Admitting: *Deleted

## 2021-09-08 NOTE — Telephone Encounter (Signed)
Phone voicemail message left to return nurse call. ? ?Duffy Rhody, RN 09-08-2021 at 1:09pm ?

## 2021-09-22 ENCOUNTER — Telehealth: Payer: Self-pay

## 2021-09-22 NOTE — Telephone Encounter (Signed)
[facility] nurse called and left VM stating she spoke with patient yesterday. Pt told her that last week she felt dizzy, lightheaded. BP yesterday was 110/88. [facility] RN is concerned due to hx of syncope and cardiac hx.  ?

## 2021-09-23 NOTE — Telephone Encounter (Signed)
Call placed to pt. Spoke with pt. Pt states doing well. Pt is PP from 08/29/21. Pt had follow up with Dr Servando Salina on 2/26. Pt denies any SOB or feeling weak, dizzy or lightheaded today. Pt has PP visit with [facility] on 10/05/21. ? ?Judeth Cornfield, RN  ?

## 2021-10-05 ENCOUNTER — Other Ambulatory Visit: Payer: Self-pay

## 2021-10-05 ENCOUNTER — Ambulatory Visit (INDEPENDENT_AMBULATORY_CARE_PROVIDER_SITE_OTHER): Payer: Medicaid Other | Admitting: Family Medicine

## 2021-10-05 ENCOUNTER — Encounter: Payer: Self-pay | Admitting: Family Medicine

## 2021-10-05 NOTE — Progress Notes (Signed)
? ? ?Post Partum Visit Note ? ?Nicole Gallegos is a 36 y.o. [phone] female who presents for a postpartum visit. She is 5 weeks 2 days postpartum following a normal spontaneous vaginal delivery.  I have fully reviewed the prenatal and intrapartum course. The delivery was at [redacted]w[redacted]d gestational weeks.  Anesthesia: none. Postpartum course has been normal. Baby is doing well. Baby is feeding by bottle - Similac 360 . Bleeding no bleeding. Bowel function is normal. Bladder function is normal. Patient is not sexually active. Contraception method is none. Postpartum depression screening: negative. ? ? ?The pregnancy intention screening data noted above was reviewed. Potential methods of contraception were discussed. The patient elected to proceed with No data recorded. ? ? Edinburgh Postnatal Depression Scale - 10/05/21 1439   ? ?  ? Edinburgh Postnatal Depression Scale:  In the Past 7 Days  ? I have been able to laugh and see the funny side of things. 0   ? I have looked forward with enjoyment to things. 0   ? I have blamed myself unnecessarily when things went wrong. 0   ? I have been anxious or worried for no good reason. 0   ? I have felt scared or panicky for no good reason. 0   ? Things have been getting on top of me. 0   ? I have been so unhappy that I have had difficulty sleeping. 0   ? I have felt sad or miserable. 0   ? I have been so unhappy that I have been crying. 0   ? The thought of harming myself has occurred to me. 0   ? Edinburgh Postnatal Depression Scale Total 0   ? ?  ?  ? ?  ? ? ?Health Maintenance Due  ?Topic Date Due  ? COVID-19 Vaccine (3 - Booster for Pfizer series) 09/21/2020  ? ? ?The following portions of the patient's history were reviewed and updated as appropriate: allergies, current medications, past family history, past medical history, past social history, past surgical history, and problem list. ? ?Review of Systems ?Pertinent items noted in HPI and remainder of comprehensive ROS otherwise  negative. ? ?Objective:  ?BP 117/88   Pulse 85   Wt 146 lb 1.6 oz (66.3 kg)   LMP 12/01/2020 (Exact Date)   Breastfeeding No   BMI 22.21 kg/m?   ? ?General:  alert, cooperative, and appears stated age  ?Lungs: Normal effort  ?Heart:  regular rate and rhythm  ?Abdomen: soft, non-tender; bowel sounds normal; no masses,  no organomegaly   ?     ?Assessment:  ? ? ?Normal postpartum exam.  ? ?Plan:  ? ?Essential components of care per ACOG recommendations: ? ?1.  Mood and well being: Patient with negative depression screening today. Reviewed local resources for support.  ?- Patient tobacco use? No.   ?- hx of drug use? No.   ? ?2. Infant care and feeding:  ?-Patient currently breastmilk feeding? No.  ?-Social determinants of health (SDOH) reviewed in EPIC. No concerns ? ?3. Sexuality, contraception and birth spacing ?- Patient does not know want a pregnancy in the next year.  Desired family size is unknown children.  ?- Reviewed reproductive life planning. Reviewed contraceptive methods based on pt preferences and effectiveness.  Patient desired No Method - Other Reason today.   ?- Discussed birth spacing of 18 months ? ?4. Sleep and fatigue ?-Encouraged family/partner/community support of 4 hrs of uninterrupted sleep to help with mood and fatigue ? ?  5. Physical Recovery  ?- Discussed patients delivery and complications. She describes her labor as good. ?- Patient had a Vaginal, no problems at delivery. Patient had no laceration. Perineal healing reviewed. Patient expressed understanding ?- Patient has urinary incontinence? No. ?- Patient is safe to resume physical and sexual activity ? ?6.  Health Maintenance ?- HM due items addressed Yes ?- Last pap smear  ?Diagnosis  ?Date Value Ref Range Status  ?02/26/2021   Final  ? - Negative for intraepithelial lesion or malignancy (NILM)  ? Pap smear not done at today's visit.  ?-Breast Cancer screening indicated? No.  ? ?7. Chronic Disease/Pregnancy Condition follow up:  None ? ?- PCP follow up ? ?Reva Bores, MD ?Center for [facility], [facility] ? ?

## 2022-02-28 IMAGING — US US OB TRANSVAGINAL
1 series · 15 of 28 positions shown · non-contrast
Comparison: 01/01/2021

CLINICAL DATA: Positive pregnancy test.

EXAM:
OBSTETRIC <14 WK US AND TRANSVAGINAL OB US
TECHNIQUE: Both transabdominal and transvaginal ultrasound examinations were
performed for complete evaluation of the gestation as well as the
maternal uterus, adnexal regions, and pelvic cul-de-sac.
Transvaginal technique was performed to assess early pregnancy.

[Series 1: us ob transvaginal · 43 acquisitions, 15 frames shown]
[im 1/43]
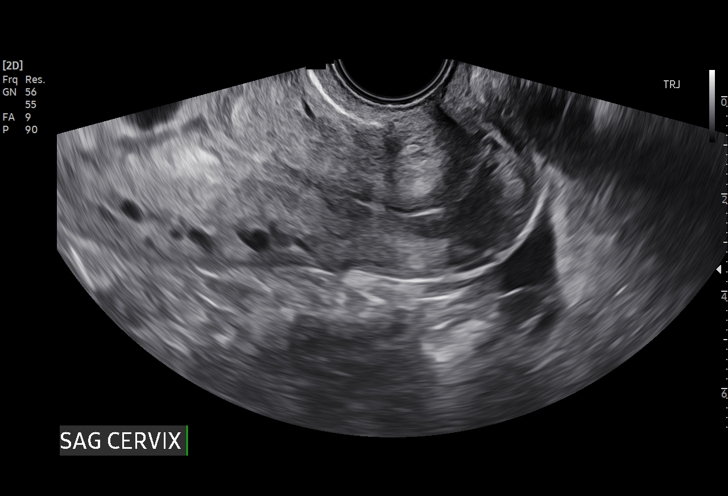
[im 4/43]
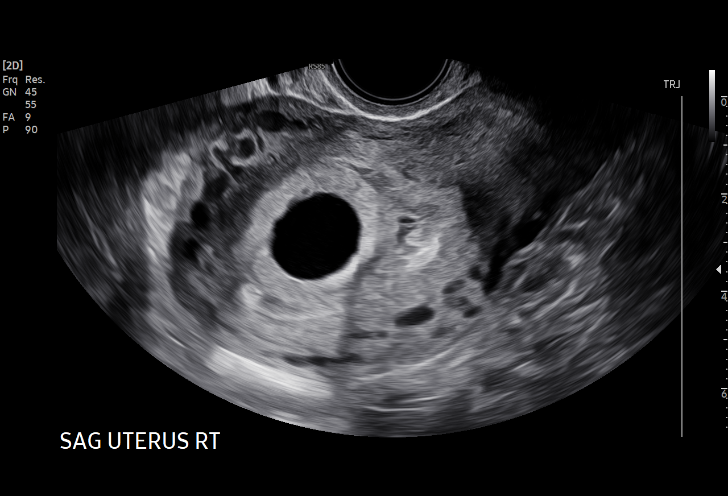
[im 7/43]
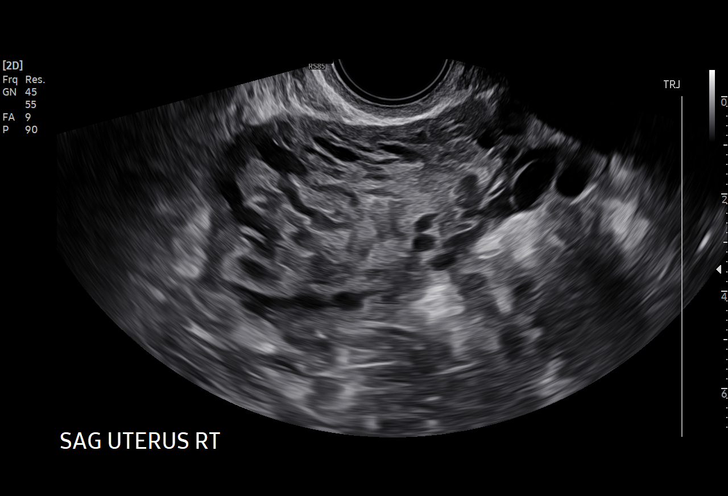
[im 10/43]
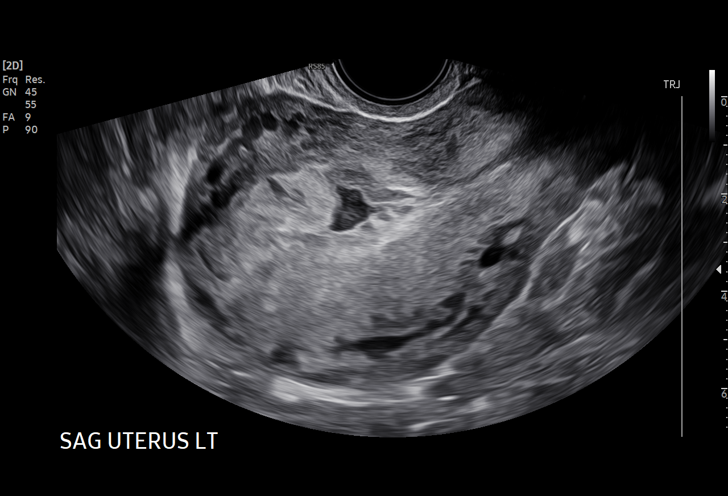
[im 13/43]
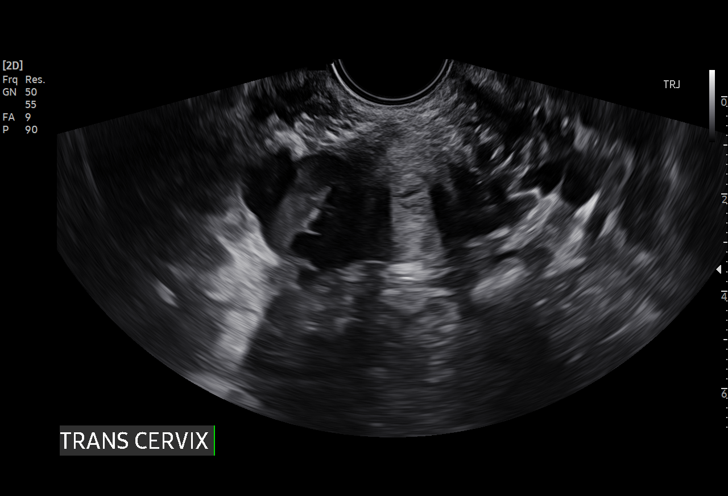
[im 16/43]
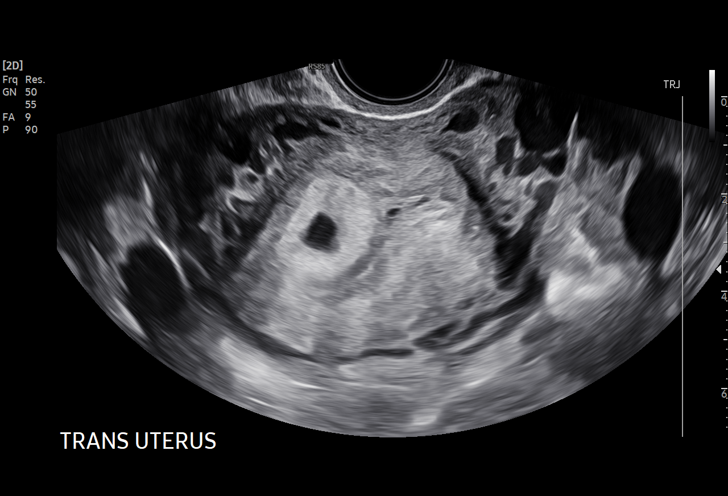
[im 19/43]
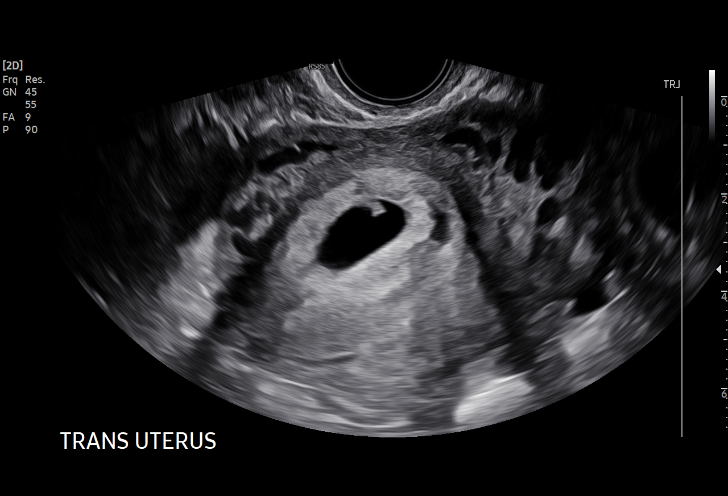
[im 22/43]
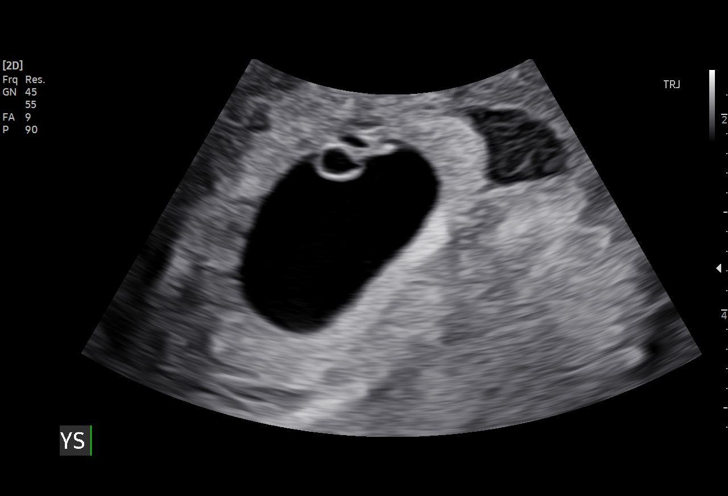
[im 24/43]
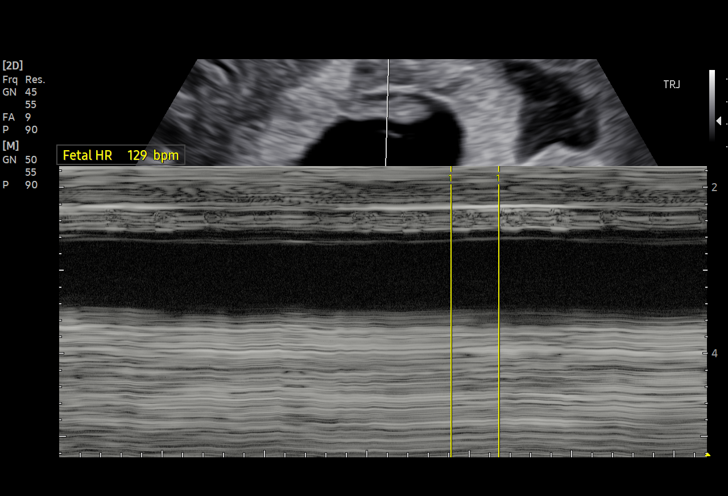
[im 27/43]
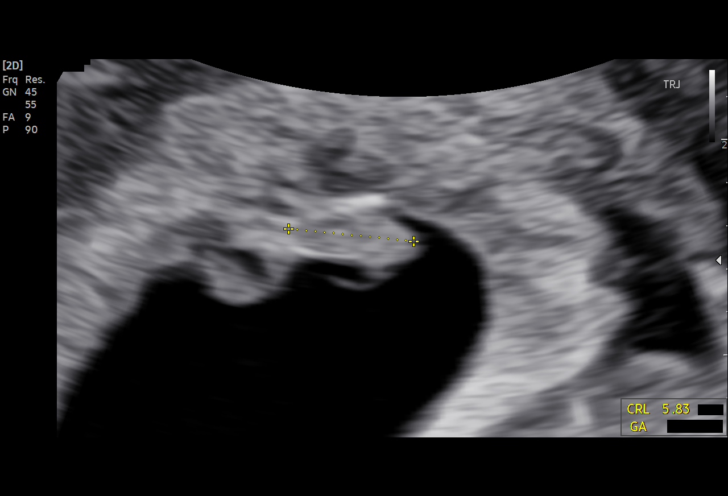
[im 30/43]
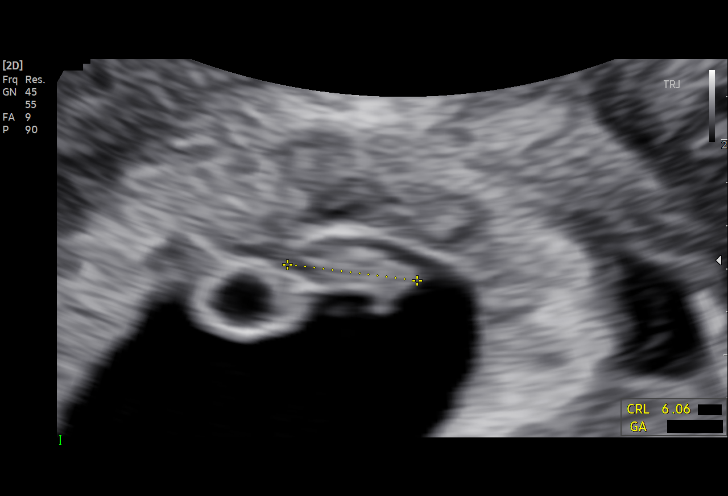
[im 33/43]
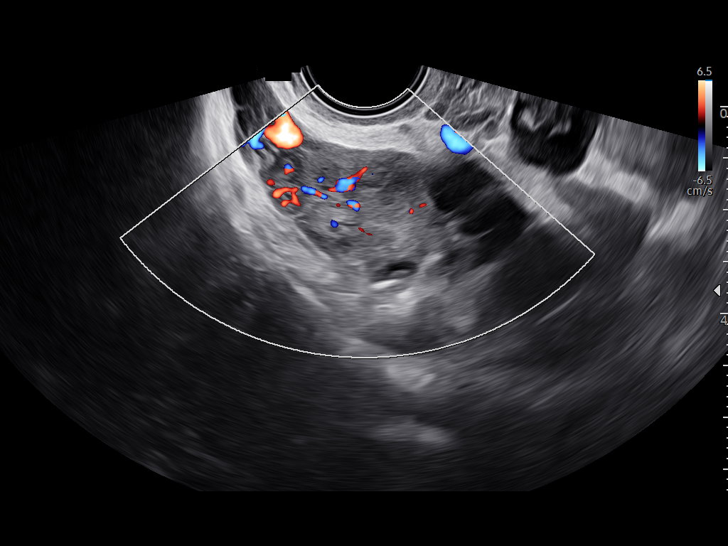
[im 36/43]
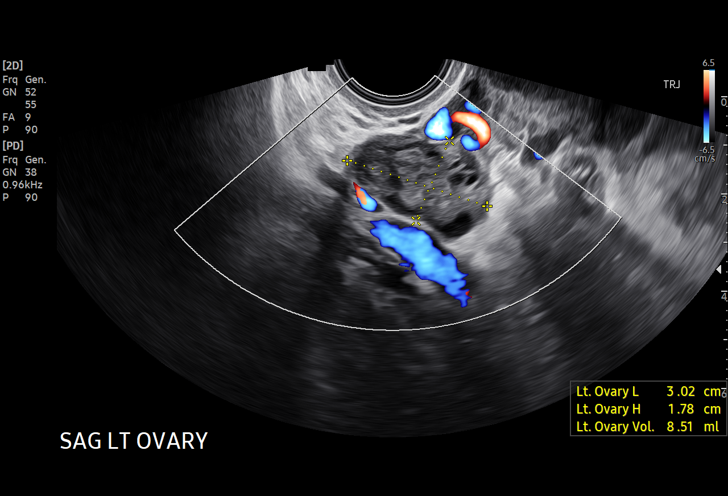
[im 39/43]
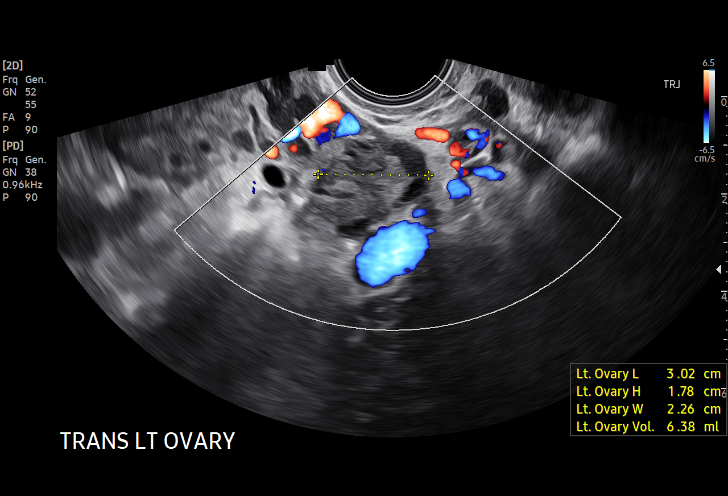
[im 43/43]
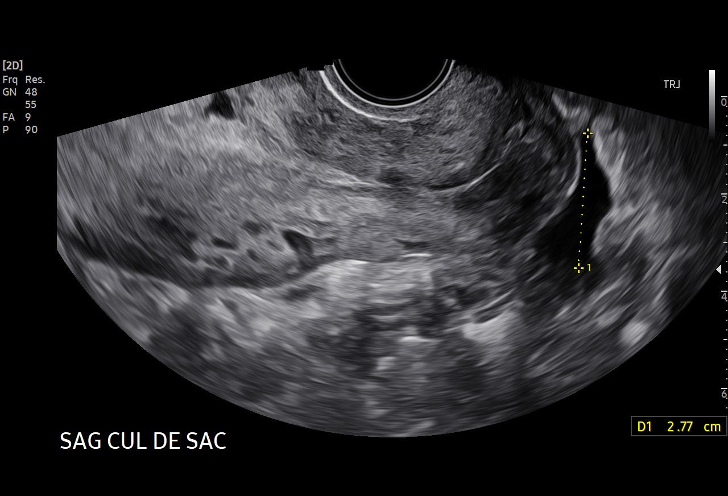

[15 of 28 positions shown; findings below may reference images not displayed]

FINDINGS: Intrauterine gestational sac: Single

Yolk sac:  Visualized

Embryo:  Visualized

Cardiac Activity: Visualized

Heart Rate: 129 bpm

CRL: 8.2 mm   6 w   5 d                  US EDC: 09/08/2021

Subchorionic hemorrhage:  None visualized.

Maternal uterus/adnexae: Unremarkable maternal ovaries. Tiny volume
free fluid in the cul-de-sac.
IMPRESSION: Single living intrauterine gestation at estimated 6 week 5 day
gestational age by crown-rump length.

## 2022-06-23 IMAGING — US US MFM OB FOLLOW-UP
1 series · 14 of 28 positions shown · non-contrast
Comparison: none

[Series 1: us mfm ob follow-up · 77 acquisitions, 14 frames shown]
[im 3/77]
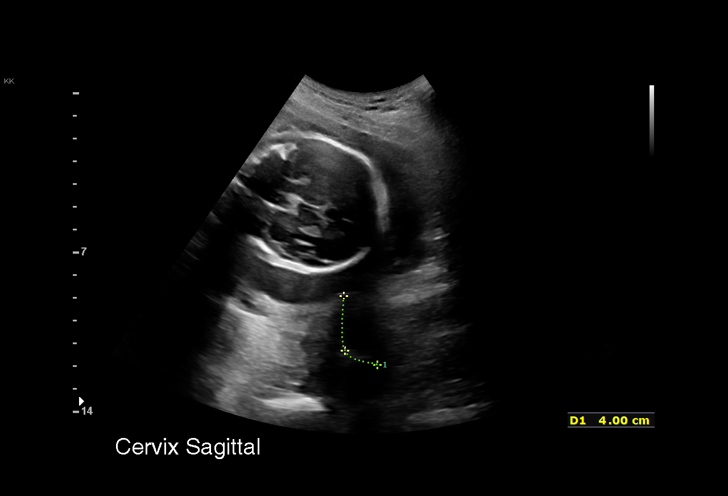
[im 9/77]
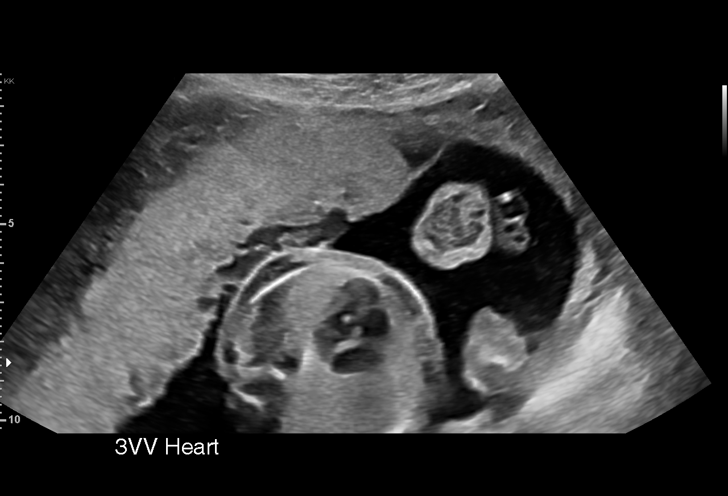
[im 15/77]
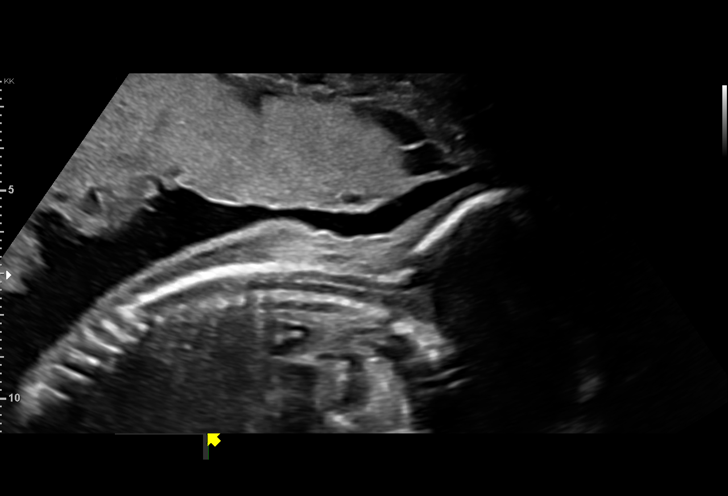
[im 20/77]
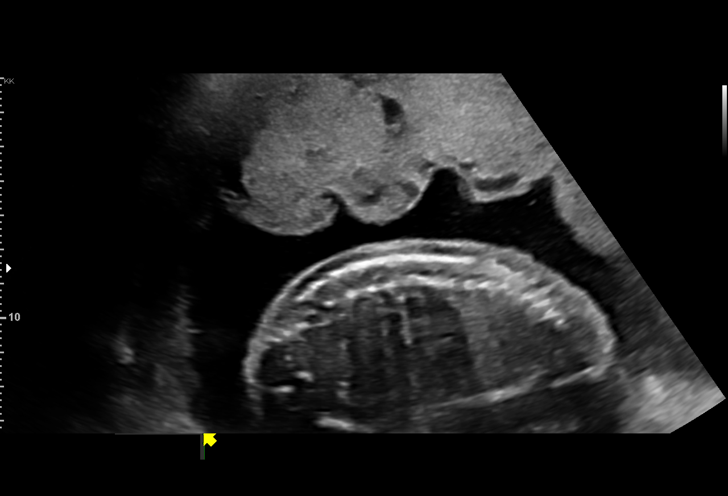
[im 26/77]
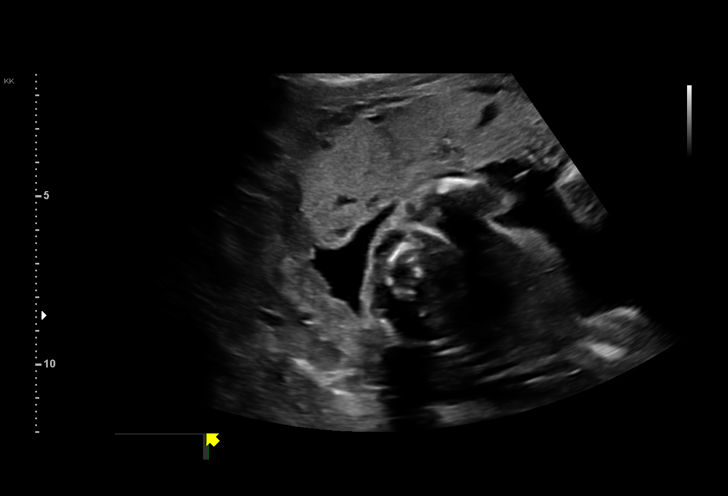
[im 31/77]
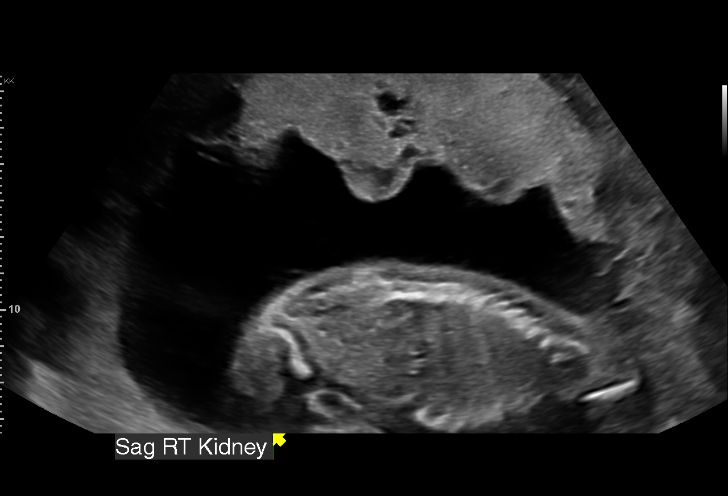
[im 37/77]
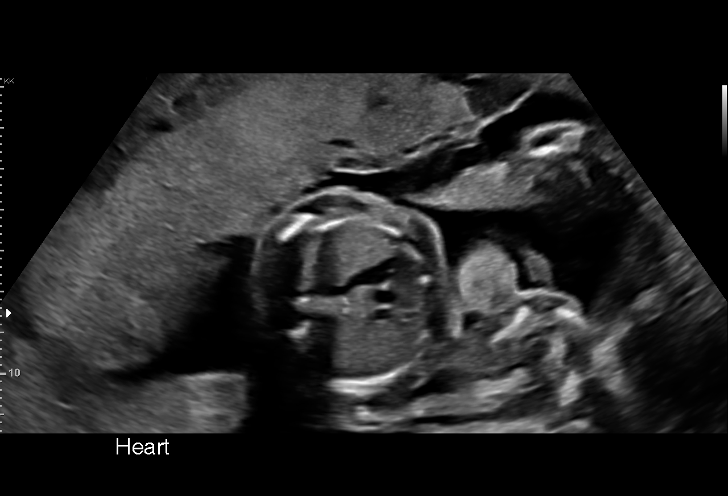
[im 43/77]
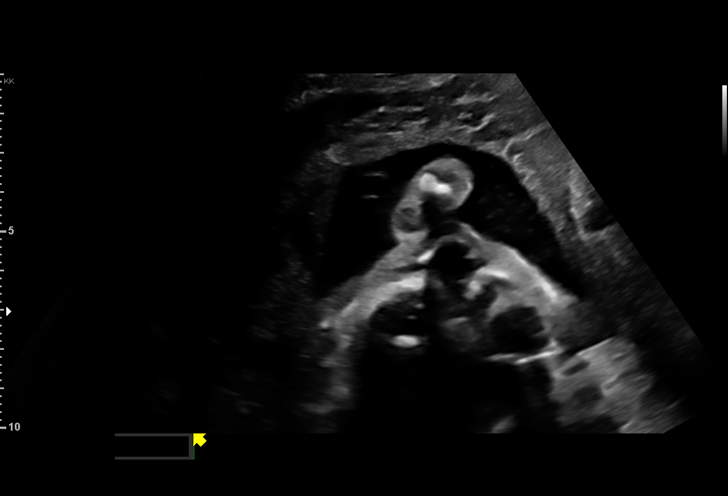
[im 48/77]
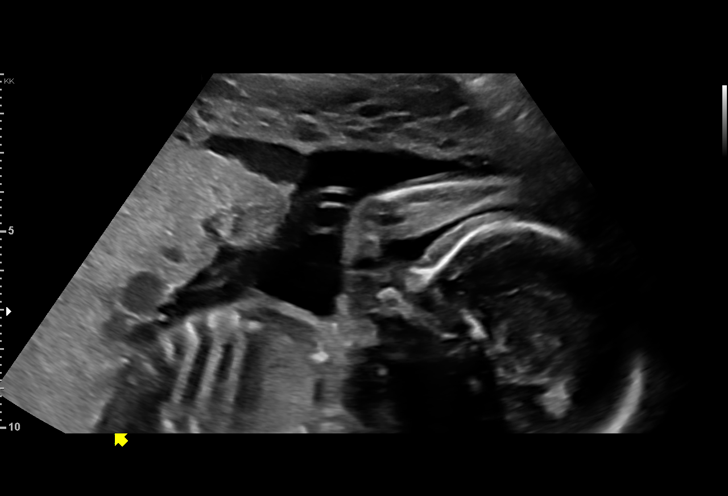
[im 54/77]
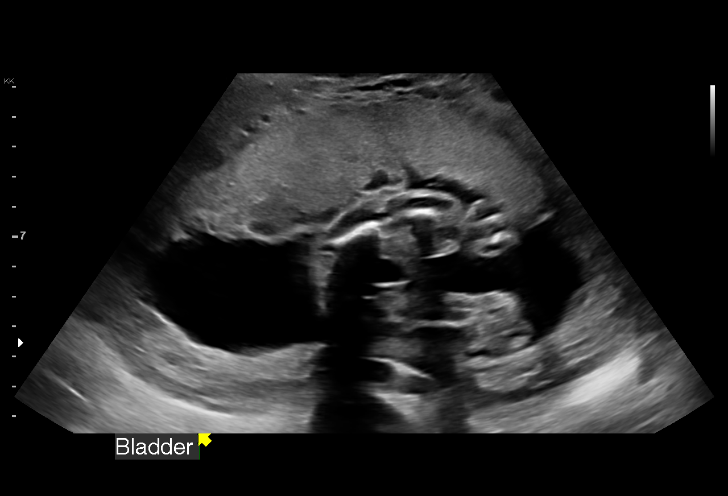
[im 60/77]
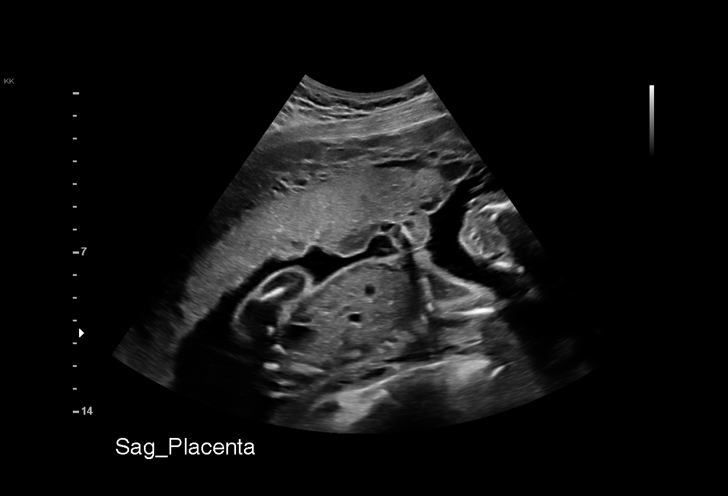
[im 65/77]
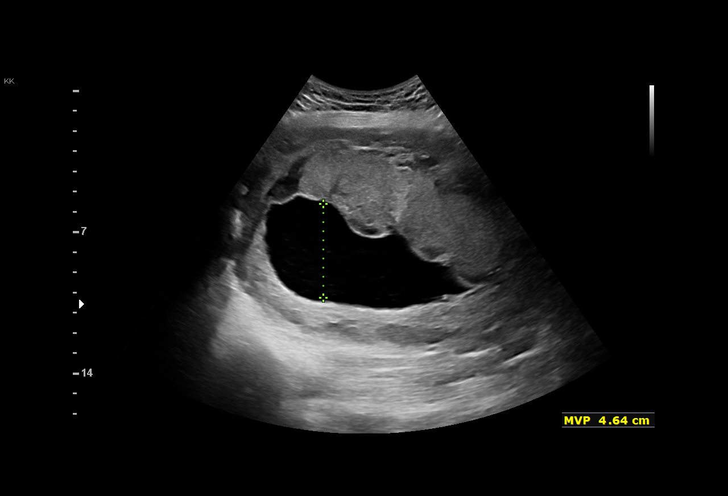
[im 71/77]
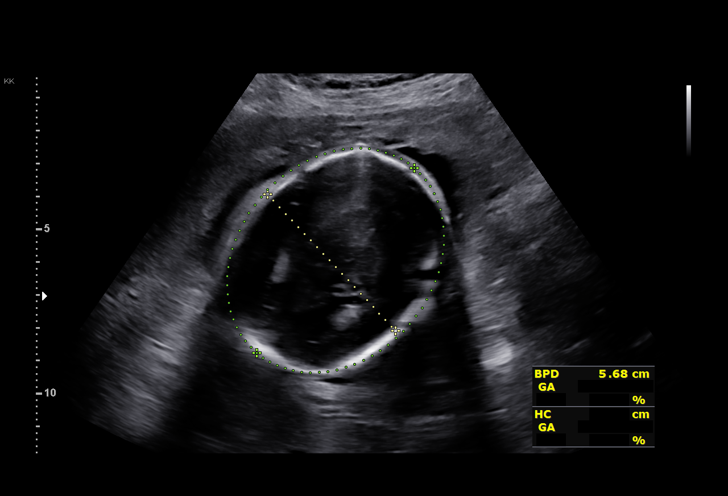
[im 77/77]
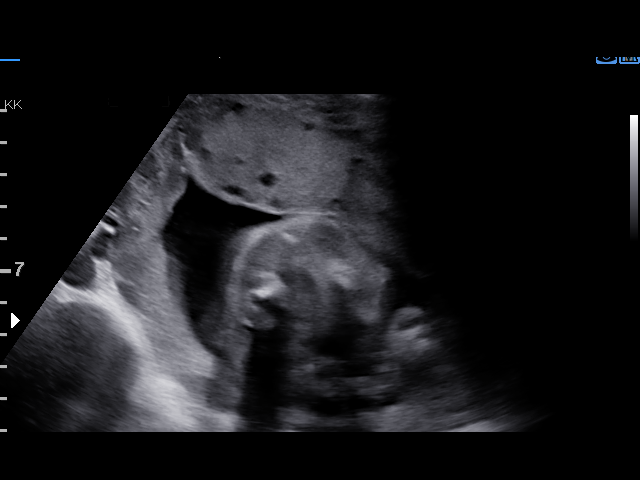

[14 of 28 positions shown; findings below may reference images not displayed]

Indications

 Poor obstetric history: Previous preeclampsia
 Poor obstetric history: Previous preterm
 delivery, antepartum (36+1 weeks)
 Low risk NIPS, neg AFP/Horizon
 Seizure disorder
 Echogenic intracardiac focus of the heart
 (EIF)
 23 weeks gestation of pregnancy
Fetal Evaluation

 Num Of Fetuses:         1
 Fetal Heart Rate(bpm):  164
 Cardiac Activity:       Observed
 Presentation:           Cephalic
 Placenta:               Anterior
 P. Cord Insertion:      Previously Visualized

 Amniotic Fluid
 AFI FV:      Within normal limits

                             Largest Pocket(cm)

Biometry

 BPD:      55.9  mm     G. Age:  23w 0d         42  %    CI:        72.87   %    70 - 86
                                                         FL/HC:      21.0   %    19.2 -
 HC:      208.2  mm     G. Age:  22w 6d         26  %    HC/AC:      1.17        1.05 -
 AC:      178.2  mm     G. Age:  22w 5d         28  %    FL/BPD:     78.4   %    71 - 87
 FL:       43.8  mm     G. Age:  24w 3d         78  %    FL/AC:      24.6   %    20 - 24

 Est. FW:     589  gm      1 lb 5 oz     54  %
Gestational Age

 U/S Today:     23w 2d                                        EDD:   09/07/21
 Best:          23w 1d     Det. By:  Early Ultrasound         EDD:   09/08/21
                                     (01/18/21)
Anatomy

 Cranium:               Appears normal         LVOT:                   Appears normal
 Cavum:                 Previously seen        Aortic Arch:            Previously seen
 Ventricles:            Previously seen        Ductal Arch:            Previously seen
 Choroid Plexus:        Previously seen        Diaphragm:              Appears normal
 Cerebellum:            Previously seen        Stomach:                Appears normal, left
                                                                       sided
 Posterior Fossa:       Previously seen        Abdomen:                Appears normal
 Nuchal Fold:           Previously seen        Abdominal Wall:         Appears nml (cord
                                                                       insert, abd wall)
 Face:                  Appears normal         Cord Vessels:           Previously seen
                        (orbits and profile)
 Lips:                  Previously seen        Kidneys:                Appear normal
 Palate:                Appears normal         Bladder:                Appears normal
 Thoracic:              Appears normal         Spine:                  Appears normal
 Heart:                 Appears normal; EIF    Upper Extremities:      Previously seen
 RVOT:                  Appears normal         Lower Extremities:      Previously seen

 Other:  3VV visualized. Technically difficult due to fetal position.
Cervix Uterus Adnexa

 Cervix
 Length:              4  cm.
 Normal appearance by transabdominal scan.
Impression

 Follow up growth due to complete the fetal anatomy.
 Normal interval growth with measurements consistent with
 dates
 Good fetal movement and amniotic fluid volume
Recommendations

 Follow up as clinically indicated.

## 2022-09-15 ENCOUNTER — Encounter (HOSPITAL_BASED_OUTPATIENT_CLINIC_OR_DEPARTMENT_OTHER): Payer: Self-pay | Admitting: Emergency Medicine

## 2022-09-15 ENCOUNTER — Emergency Department (HOSPITAL_BASED_OUTPATIENT_CLINIC_OR_DEPARTMENT_OTHER)
Admission: EM | Admit: 2022-09-15 | Discharge: 2022-09-15 | Disposition: A | Discharge status: Home / Self Care | Payer: Medicaid Other | Attending: Emergency Medicine | Admitting: Emergency Medicine

## 2022-09-15 ENCOUNTER — Emergency Department (HOSPITAL_BASED_OUTPATIENT_CLINIC_OR_DEPARTMENT_OTHER): Payer: Medicaid Other

## 2022-09-15 ENCOUNTER — Other Ambulatory Visit: Payer: Self-pay

## 2022-09-15 DIAGNOSIS — R7401 Elevation of levels of liver transaminase levels: Secondary | ICD-10-CM | POA: Insufficient documentation

## 2022-09-15 DIAGNOSIS — R519 Headache, unspecified: Secondary | ICD-10-CM | POA: Insufficient documentation

## 2022-09-15 DIAGNOSIS — R110 Nausea: Secondary | ICD-10-CM | POA: Diagnosis not present

## 2022-09-15 DIAGNOSIS — R1013 Epigastric pain: Secondary | ICD-10-CM | POA: Insufficient documentation

## 2022-09-15 LAB — COMPREHENSIVE METABOLIC PANEL
ALT: 33 U/L (ref 0–44)
AST: 25 U/L (ref 15–41)
Albumin: 4.8 g/dL (ref 3.5–5.0)
Alkaline Phosphatase: 184 U/L — ABNORMAL HIGH (ref 38–126)
Anion gap: 8 (ref 5–15)
BUN: 10 mg/dL (ref 6–20)
CO2: 28 mmol/L (ref 22–32)
Calcium: 10 mg/dL (ref 8.9–10.3)
Chloride: 101 mmol/L (ref 98–111)
Creatinine, Ser: 0.84 mg/dL (ref 0.44–1.00)
GFR, Estimated: >60 mL/min (ref >60)
Glucose, Bld: 78 mg/dL (ref 70–99)
Potassium: 3.7 mmol/L (ref 3.5–5.1)
Sodium: 137 mmol/L (ref 135–145)
Total Bilirubin: 1.3 mg/dL — ABNORMAL HIGH (ref 0.3–1.2)
Total Protein: 7.9 g/dL (ref 6.5–8.1)

## 2022-09-15 LAB — URINALYSIS, ROUTINE W REFLEX MICROSCOPIC
Bilirubin Urine: NEGATIVE
Glucose, UA: NEGATIVE mg/dL
Hgb urine dipstick: NEGATIVE
Leukocytes,Ua: NEGATIVE
Nitrite: NEGATIVE
Protein, ur: NEGATIVE mg/dL
Specific Gravity, Urine: 1.015 (ref 1.005–1.030)
pH: 6.5 (ref 5.0–8.0)

## 2022-09-15 LAB — LIPASE, BLOOD: Lipase: 14 U/L (ref 11–51)

## 2022-09-15 LAB — CBC
HCT: 43 % (ref 36.0–46.0)
Hemoglobin: 14.5 g/dL (ref 12.0–15.0)
MCH: 30.2 pg (ref 26.0–34.0)
MCHC: 33.7 g/dL (ref 30.0–36.0)
MCV: 89.6 fL (ref 80.0–100.0)
Platelets: 239 10*3/uL (ref 150–400)
RBC: 4.8 MIL/uL (ref 3.87–5.11)
RDW: 12.6 % (ref 11.5–15.5)
WBC: 5.4 10*3/uL (ref 4.0–10.5)
nRBC: 0 % (ref 0.0–0.2)

## 2022-09-15 LAB — PREGNANCY, URINE: Preg Test, Ur: NEGATIVE

## 2022-09-15 MED ORDER — ALUM & MAG HYDROXIDE-SIMETH 200-200-20 MG/5ML PO SUSP
30.0000 mL | Freq: Once | ORAL | Status: AC
  Administered 2022-09-15: 30 mL via ORAL
  Filled 2022-09-15: qty 30

## 2022-09-15 NOTE — ED Triage Notes (Addendum)
Upper abd pain, saw her dr today and they thought it might be her GB sent for [place] some nausea hasn't eaten a lot in 3 weeks  denies vomiting or dysuria

## 2022-09-15 NOTE — Discharge Instructions (Signed)
Your blood work, abdominal ultrasound, and CT scan of your abdomen were negative.  We did not find an emergent cause of your symptoms today.  Please follow-up with your GI doctor in the next week to discuss your ED visit today and continued problems with abdominal pain.  Continue to take your regular medications as prescribed by your outpatient providers.  For any new or concerning emergent symptoms, please return to the emergency department for reevaluation.

## 2022-09-15 NOTE — ED Provider Notes (Signed)
This is a 37 year old patient with past medical history of GERD who presents to the ED complaining of recurrent epigastric tenderness x 3 weeks, decreased appetite, weight loss of 10 to 15 pounds.  Her pain does improve with eating.  Her lab work and a right upper quadrant ultrasound have been normal.  She has no history metastatic disease but with recent weight loss CT abdomen pelvis ordered for further evaluation.  Patient was given GI cocktail with only minimal improvement in symptoms.  Vital signs are stable.  Pending CT abdomen pelvis and if negative can discharge home to follow-up with GI who she already has establish care with.  CT abdomen pelvis today without any acute or emergent findings.  Patient stable for discharge.   Nicole Gallegos 09/15/22 Lynett Fish, MD 09/16/22 [phone]

## 2022-09-15 NOTE — ED Provider Notes (Signed)
Nicole Gallegos   CSN: [id number] Arrival date & time: 09/15/22  1618     History  Chief Complaint  Patient presents with   Abdominal Pain    Nicole Gallegos is a 37 y.o. female.  The history is provided by the patient and medical records. No language interpreter was used.  Abdominal Pain    Patient is a 37 year old female presenting to the ED with intermittent upper abdominal pain. Patient states she has been experiencing a burning sensation with a 10/10 pain to epigastric area. Patient states she has been feeling nauseous but denies vomiting. She reports a loss of appetite and a weight loss of approximately 14 lbs. Patient states she has noticed some pain relief when she does eat. Patient has also been experiencing constipation for one week. She states she tried Miralax with no improvement. Patient has been having a headache and has been feeling lightheaded. She denies any fever, shortness of breath, and chest pain. Patient's last menstrual cycle was about two weeks ago.     Home Medications Prior to Admission medications   Medication Sig Start Date End Date Taking? Authorizing Provider  acetaminophen (TYLENOL) 500 MG tablet Take 2 tablets (1,000 mg total) by mouth every 8 (eight) hours as needed (pain). Patient not taking: Reported on 10/05/2021 08/31/21   Genia Del, MD  Blood Pressure Monitoring (BLOOD PRESSURE KIT) DEVI 1 Device by Does not apply route as needed. Patient not taking: Reported on 10/05/2021 02/26/21   Aletha Halim, MD  fluticasone (FLOVENT HFA) 110 MCG/ACT inhaler Inhale 1 puff into the lungs in the morning and at bedtime. Patient not taking: Reported on 10/05/2021 02/26/21   Aletha Halim, MD  gabapentin (NEURONTIN) 100 MG capsule Take 1 capsule (100 mg total) by mouth 3 (three) times daily as needed. Patient not taking: Reported on 10/05/2021 09/07/21     ibuprofen (ADVIL) 600 MG tablet Take 1  tablet (600 mg total) by mouth every 6 (six) hours as needed. Patient not taking: Reported on 10/05/2021 08/31/21   Genia Del, MD  montelukast (SINGULAIR) 10 MG tablet Take 1 tablet (10 mg total) by mouth at bedtime. Patient not taking: Reported on 09/02/2021 07/20/21   Aletha Halim, MD  Prenatal Vit-Fe Fumarate-FA (MULTIVITAMIN-PRENATAL) 27-0.8 MG TABS tablet Take 1 tablet by mouth daily at 12 noon.    [provider]      Allergies    Chocolate    Review of Systems   Review of Systems  Gastrointestinal:  Positive for abdominal pain.  All other systems reviewed and are negative.   Physical Exam Updated Vital Signs BP 112/82 (BP Location: Right Arm)   Pulse 78   Temp 97.7 F (36.5 C)   Resp 18   Ht '5\' 8"'$  (1.727 m)   Wt 62.6 kg   SpO2 100%   BMI 20.98 kg/m  Physical Exam Vitals and nursing Gallegos reviewed.  Constitutional:      General: She is not in acute distress.    Appearance: She is well-developed.  HENT:     Head: Atraumatic.  Eyes:     Conjunctiva/sclera: Conjunctivae normal.  Cardiovascular:     Rate and Rhythm: Normal rate and regular rhythm.  Pulmonary:     Effort: Pulmonary effort is normal.  Abdominal:     Palpations: Abdomen is soft.     Tenderness: There is abdominal tenderness (epigastric pain).  Musculoskeletal:     Cervical back: Neck  supple.  Skin:    Findings: No rash.  Neurological:     Mental Status: She is alert.  Psychiatric:        Mood and Affect: Mood normal.     ED Results / Procedures / Treatments   Labs (all labs ordered are listed, but only abnormal results are displayed) Labs Reviewed  COMPREHENSIVE METABOLIC PANEL - Abnormal; Notable for the following components:      Result Value   Alkaline Phosphatase 184 (*)    Total Bilirubin 1.3 (*)    All other components within normal limits  URINALYSIS, ROUTINE W REFLEX MICROSCOPIC - Abnormal; Notable for the following components:   Ketones, ur TRACE (*)    All  other components within normal limits  LIPASE, BLOOD  CBC  PREGNANCY, URINE    EKG None  Radiology US Abdomen Limited  Result Date: 09/15/2022 CLINICAL DATA:  Abdominal pain, nausea, elevated LFTs EXAM: ULTRASOUND ABDOMEN LIMITED RIGHT UPPER QUADRANT COMPARISON:  None Available. FINDINGS: Gallbladder: Mild layering gallbladder sludge. No gallbladder wall thickening or pericholecystic fluid. Common bile duct: Diameter: 3 mm Liver: No focal lesion identified. Within normal limits in parenchymal echogenicity. Portal vein is patent on color Doppler imaging with normal direction of blood flow towards the liver. Other: None. IMPRESSION: Negative right upper quadrant ultrasound. Electronically Signed   By: Julian Hy M.D.   On: 09/15/2022 18:46    Procedures Procedures    Medications Ordered in ED Medications  alum & mag hydroxide-simeth (MAALOX/MYLANTA) 200-200-20 MG/5ML suspension 30 mL (30 mLs Oral Given 09/15/22 1805)    ED Course/ Medical Decision Making/ A&P                             Medical Decision Making Amount and/or Complexity of Data Reviewed Labs: ordered. Radiology: ordered.   BP 112/82 (BP Location: Right Arm)   Pulse 78   Temp 97.7 F (36.5 C)   Resp 18   Ht '5\' 8"'$  (1.727 m)   Wt 62.6 kg   SpO2 100%   BMI 20.98 kg/m   5:56 PM Patient is a 37 year old female presenting to the ED with intermittent upper abdominal pain. Patient states she has been experiencing a burning sensation with a 10/10 pain to epigastric area. Patient states she has been feeling nauseous but denies vomiting. She reports a loss of appetite and a weight loss of approximately 14 lbs. Patient states she has noticed some pain relief when she does eat. Patient has also been experiencing constipation for one week. She states she tried Miralax with no improvement. Patient has been having a headache and has been feeling lightheaded. She denies any fever, shortness of breath, and chest pain.  Patient's last menstrual cycle was about two weeks ago.   On exam this is a well-appearing female resting comfortably in bed appears to be in no acute discomfort.  Heart with normal rate and rhythm, lungs are clear to auscultation bilaterally abdomen is soft but tenderness noted to epigastric without guarding or rebound tenderness.  Negative Murphy sign, no pain at McBurney's point no overlying skin changes.  Vital signs reviewed and overall reassuring, patient is afebrile, no hypoxia.  -Labs ordered, independently viewed and interpreted by me.  Labs remarkable for preg test negative, normal lipase, normal UA, normal WBC and H&H.  Electrolytes are reassuring.   -The patient was maintained on a cardiac monitor.  I personally viewed and interpreted the cardiac  monitored which showed an underlying rhythm of: NSR -Imaging independently viewed and interpreted by me and I agree with radiologist's interpretation.  Result remarkable for limited abd [place] without finding.  Abd/pelvis CT pending -This patient presents to the ED for concern of abd pain, this involves an extensive number of treatment options, and is a complaint that carries with it a high risk of complications and morbidity.  The differential diagnosis includes gastritis, PUD, cholecystitis, pancreatitis, appendicitis, colitis, malignancy -Co morbidities that complicate the patient evaluation includes anxiety -Treatment includes GI cocktail -Reevaluation of the patient after these medicines showed that the patient stayed the same -PCP office notes or outside notes reviewed -Discussion with oncoming provider who will f/u on abd/pelvis CT and determine disposition -Escalation to admission/observation considered: patient is awaiting CT.         Final Clinical Impression(s) / ED Diagnoses Final diagnoses:  Epigastric pain    Rx / DC Orders ED Discharge Orders     None         Domenic Moras, PA-C 09/15/22 1903    Malvin Johns,  MD 09/16/22 [phone]

## 2023-04-04 ENCOUNTER — Encounter: Payer: Medicaid Other | Admitting: Obstetrics & Gynecology

## 2023-05-31 ENCOUNTER — Telehealth: Payer: Self-pay | Admitting: Family Medicine

## 2023-05-31 NOTE — Telephone Encounter (Signed)
Patient called in stating she was told by her primary care doctor to call us because she is experiencing abdominal pain, cramping like she is on her cycle, breast is very tender and primary care told her it could be caused by her reversal.

## 2023-06-01 NOTE — Telephone Encounter (Signed)
Called and spoke with patient. She went to her PCP. She has been having severe abdominal pain. She is having some tenderness with bending or coughing. Her breasts are also hurting all the time. She has not BF since her last baby, she did not make milk with her.   She took a pregnancy test and was negative.   She is feeling severe cramping in the lower abdomen on the left side only  When she had her tubal reversal she was informed that the clamp had come off, the left tube, however was not retrieved. This was performed in [place] and has conceived since this surgery.   She stopped taking Depo in early 2024 due to side effects, including mental health issues. Patient is not taking any medications currently. She is not trying to get pregnant at this time, she is not using birth control at this time, she is using abstinence.   Reviewed with patient that she needs to have an office visit with GYN to discuss concerns. Reviewed a front office person will call her to schedule. Patient voiced understanding.

## 2023-06-05 ENCOUNTER — Encounter: Payer: Self-pay | Admitting: Family Medicine

## 2023-06-05 ENCOUNTER — Other Ambulatory Visit (HOSPITAL_COMMUNITY)
Admission: RE | Admit: 2023-06-05 | Discharge: 2023-06-05 | Disposition: A | Discharge status: Home / Self Care | Payer: Medicaid Other | Source: Ambulatory Visit | Attending: Family Medicine | Admitting: Family Medicine

## 2023-06-05 ENCOUNTER — Ambulatory Visit (INDEPENDENT_AMBULATORY_CARE_PROVIDER_SITE_OTHER): Payer: Medicaid Other | Admitting: Family Medicine

## 2023-06-05 VITALS — BP 111/79 | HR 75 | Ht 67.0 in

## 2023-06-05 DIAGNOSIS — R102 Pelvic and perineal pain: Secondary | ICD-10-CM

## 2023-06-05 DIAGNOSIS — R1032 Left lower quadrant pain: Secondary | ICD-10-CM

## 2023-06-05 MED ORDER — CYCLOBENZAPRINE HCL 10 MG PO TABS: 10.0000 mg | ORAL_TABLET | Freq: Three times a day (TID) | ORAL | 1 refills | Status: DC | PRN

## 2023-06-05 NOTE — Progress Notes (Signed)
   GYNECOLOGY PROBLEM  VISIT ENCOUNTER NOTE  Subjective:   Nicole Gallegos is a 37 y.o. [phone] female here for a problem GYN visit.  Current complaints: LLQ pain   LLQ Pain:  Tubal reversal performed in 2022. She has become pregnant after her tubal reversal, delivery in 2023 She reports new onset in the LLQ in the last couple weeks. She rates the pain as 10/10. Comes with records that showed no filshie clip on the left. She reports the pain radiates down her left leg. Pain is aggravated by sex. .     Reports all symptoms developed in the last month. She was put on depo in June but then had SE. She reports the onset of pain was right when she would have been due to depo. The pain is constant but worsens signigicantly when she is on her period. She reports she was told she might be developing endometriosis by a provider. She describes the pain as like labor.   No changes in bowel habits, no blood in stool, changes in stool. No tarry stools or white stools.   Denies abnormal vaginal bleeding, discharge or other gynecologic concerns.    Gynecologic History Patient's last menstrual period was 05/17/2023 (exact date).  Contraception: none  Health Maintenance Due  Topic Date Due   INFLUENZA VACCINE  02/09/2023   COVID-19 Vaccine (3 - 2023-24 season) 03/12/2023    The following portions of the patient's history were reviewed and updated as appropriate: allergies, current medications, past family history, past medical history, past social history, past surgical history and problem list.  Review of Systems Pertinent items are noted in HPI.   Objective:  BP 111/79   Pulse 75   Ht 5\' 7"  (1.702 m)   LMP 05/17/2023 (Exact Date)   Breastfeeding No   BMI 21.61 kg/m  Gen: well appearing, NAD HEENT: no scleral icterus CV: RR Lung: Normal WOB Ext: warm well perfused ABDOMEN: TTP in the LLQ, right near pelvic rim. + rebound.  The rest of her abd exam is benign.   PELVIC: Normal appearing  external genitalia; normal appearing vaginal mucosa  and cervix. Normal uterine size on bimanual. + pain with left adnexal exam, increased fullness. No uterine tenderness.    Assessment and Plan:  1. Left lower quadrant pain DDX include ovarian cyst, scar tissue/possible retained filshie clip, TOA Will get [place] and then if no cause identified will consider CT scan.  - US PELVIC COMPLETE WITH TRANSVAGINAL; Future - Recommended NSAID scheduled.  - PRN flexeril sent to pharmacy  2. Pelvic pain Collected swabs today   Please refer to After Visit Summary for other counseling recommendations.   Return in about 3 weeks (around 06/26/2023), or if symptoms worsen or fail to improve.  Federico Flake, MD, MPH, ABFM Attending Physician Faculty Practice- [facility]

## 2023-06-06 LAB — CERVICOVAGINAL ANCILLARY ONLY
Bacterial Vaginitis (gardnerella): POSITIVE — AB
Candida Glabrata: NEGATIVE
Candida Vaginitis: NEGATIVE
Chlamydia: NEGATIVE
Comment: NEGATIVE
Comment: NEGATIVE
Comment: NEGATIVE
Comment: NEGATIVE
Comment: NEGATIVE
Comment: NORMAL
Neisseria Gonorrhea: NEGATIVE
Trichomonas: NEGATIVE

## 2023-06-06 MED ORDER — METRONIDAZOLE 500 MG PO TABS: 500.0000 mg | ORAL_TABLET | Freq: Two times a day (BID) | ORAL | 0 refills | Status: DC

## 2023-06-06 NOTE — Addendum Note (Signed)
Addended by: Geanie Berlin on: 06/06/2023 08:35 PM   Modules accepted: Orders

## 2023-06-07 ENCOUNTER — Ambulatory Visit (HOSPITAL_COMMUNITY)
Admission: RE | Admit: 2023-06-07 | Discharge: 2023-06-07 | Disposition: A | Discharge status: Home / Self Care | Payer: Medicaid Other | Source: Ambulatory Visit | Attending: Family Medicine | Admitting: Family Medicine

## 2023-06-07 ENCOUNTER — Telehealth: Payer: Self-pay

## 2023-06-07 DIAGNOSIS — R1032 Left lower quadrant pain: Secondary | ICD-10-CM | POA: Insufficient documentation

## 2023-06-07 NOTE — Telephone Encounter (Addendum)
-----   Message from Federico Flake sent at 06/06/2023  8:35 PM EST ----- BV present. I sent in medication to treat  Left message for pt that I am calling for results and that we have sent a prescription to her [facility] pharmacy on [address]. I also see that she had read the MyChart message by the provider if she continues to have questions please don't hesitate to reach out to [place] via MyChart as it the quickest response.    Leonette Nutting

## 2023-06-15 ENCOUNTER — Telehealth: Payer: Self-pay

## 2023-06-15 NOTE — Telephone Encounter (Addendum)
-----   Message from Federico Flake sent at 06/15/2023  9:47 AM EST ----- Normal [place]. Can you reach out to see how patient is doing?  Pt notified.  Pt reports that she is feeling fine and her cycle just went off.  Pt advised to f/u as needed.  Pt verbalized understanding with no further questions.   Ramirez Fullbright,RN  06/15/23

## 2023-08-30 ENCOUNTER — Emergency Department (HOSPITAL_COMMUNITY)
Admission: EM | Admit: 2023-08-30 | Discharge: 2023-08-30 | Disposition: A | Discharge status: Home / Self Care | Payer: Medicaid Other | Attending: Emergency Medicine | Admitting: Emergency Medicine

## 2023-08-30 ENCOUNTER — Other Ambulatory Visit: Payer: Self-pay

## 2023-08-30 ENCOUNTER — Encounter (HOSPITAL_COMMUNITY): Payer: Self-pay

## 2023-08-30 DIAGNOSIS — R059 Cough, unspecified: Secondary | ICD-10-CM | POA: Diagnosis present

## 2023-08-30 DIAGNOSIS — B349 Viral infection, unspecified: Secondary | ICD-10-CM | POA: Insufficient documentation

## 2023-08-30 DIAGNOSIS — R531 Weakness: Secondary | ICD-10-CM | POA: Insufficient documentation

## 2023-08-30 LAB — RESP PANEL BY RT-PCR (RSV, FLU A&B, COVID)  RVPGX2
Influenza A by PCR: NEGATIVE
Influenza B by PCR: NEGATIVE
Resp Syncytial Virus by PCR: NEGATIVE
SARS Coronavirus 2 by RT PCR: NEGATIVE

## 2023-08-30 MED ORDER — PROMETHAZINE-DM 6.25-15 MG/5ML PO SYRP: 5.0000 mL | ORAL_SOLUTION | Freq: Four times a day (QID) | ORAL | 0 refills | Status: AC | PRN

## 2023-08-30 MED ORDER — IBUPROFEN 200 MG PO TABS
600.0000 mg | ORAL_TABLET | Freq: Once | ORAL | Status: AC
  Administered 2023-08-30: 600 mg via ORAL
  Filled 2023-08-30: qty 3

## 2023-08-30 MED ORDER — METOCLOPRAMIDE HCL 10 MG PO TABS: 10.0000 mg | ORAL_TABLET | Freq: Four times a day (QID) | ORAL | 0 refills | Status: AC

## 2023-08-30 MED ORDER — IBUPROFEN 600 MG PO TABS
600.0000 mg | ORAL_TABLET | Freq: Four times a day (QID) | ORAL | 0 refills | Status: AC | PRN
Start: 2023-08-30 — End: ?

## 2023-08-30 MED ORDER — METOCLOPRAMIDE HCL 10 MG PO TABS
10.0000 mg | ORAL_TABLET | Freq: Once | ORAL | Status: AC
Start: 2023-08-30 — End: 2023-08-30
  Administered 2023-08-30: 10 mg via ORAL
  Filled 2023-08-30: qty 1

## 2023-08-30 NOTE — ED Provider Notes (Signed)
Nicole Gallegos EMERGENCY DEPARTMENT AT [facility] Provider Note   CSN: [id number] Arrival date & time: 08/30/23  5366     History  Chief Complaint  Patient presents with   Weakness    Nicole Gallegos is a 38 y.o. female.   Weakness  Patient is a 38 year old female with no pertinent past medical history presented emergency room today with complaints of fatigue, generalized weakness frontal headache ear pressure and sinus congestion as well as dry cough.  She states that she is on day 3 or 4 symptoms currently.  No chest pain or significant dyspnea.  No vomiting but does feel nauseous at times.  She states her primary concern is her headache.  She is worried that she may have influenza or COVID-19 she does have a sick contact at home which is her daughter.  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  No fevers or chills.     Home Medications Prior to Admission medications   Medication Sig Start Date End Date Taking? Authorizing Provider  ibuprofen (ADVIL) 600 MG tablet Take 1 tablet (600 mg total) by mouth every 6 (six) hours as needed. 08/30/23  Yes Ruie Sendejo S, PA  metoCLOPramide (REGLAN) 10 MG tablet Take 1 tablet (10 mg total) by mouth every 6 (six) hours. 08/30/23  Yes Rashon Westrup S, PA  promethazine-dextromethorphan (PROMETHAZINE-DM) 6.25-15 MG/5ML syrup Take 5 mLs by mouth 4 (four) times daily as needed for cough. 08/30/23  Yes Cason Dabney S, PA  Blood Pressure Monitoring (BLOOD PRESSURE KIT) DEVI 1 Device by Does not apply route as needed. Patient not taking: Reported on 10/05/2021 02/26/21   Bonny Doon Bing, MD      Allergies    Chocolate    Review of Systems   Review of Systems  Neurological:  Positive for weakness.    Physical Exam Updated Vital Signs BP 117/80   Pulse 66   Temp 98.4 F (36.9 C) (Oral)   Resp 16   Ht 5\' 7"  (1.702 m)   Wt 62.6 kg   SpO2 100%   BMI 21.62 kg/m  Physical Exam Vitals and nursing note  reviewed.  Constitutional:      General: She is not in acute distress. HENT:     Head: Normocephalic and atraumatic.     Nose: Nose normal.  Eyes:     General: No scleral icterus. Cardiovascular:     Rate and Rhythm: Normal rate and regular rhythm.     Pulses: Normal pulses.     Heart sounds: Normal heart sounds.  Pulmonary:     Effort: Pulmonary effort is normal. No respiratory distress.     Breath sounds: No wheezing.  Abdominal:     Palpations: Abdomen is soft.     Tenderness: There is no abdominal tenderness.  Musculoskeletal:     Cervical back: Normal range of motion.     Right lower leg: No edema.     Left lower leg: No edema.  Skin:    General: Skin is warm and dry.     Capillary Refill: Capillary refill takes less than 2 seconds.  Neurological:     Mental Status: She is alert. Mental status is at baseline.  Psychiatric:        Mood and Affect: Mood normal.        Behavior: Behavior normal.     ED Results / Procedures / Treatments   Labs (all labs ordered are listed, but only abnormal results  are displayed) Labs Reviewed  RESP PANEL BY RT-PCR (RSV, FLU A&B, COVID)  [id number]    EKG None  Radiology No results found.  Procedures Procedures    Medications Ordered in ED Medications  metoCLOPramide (REGLAN) tablet 10 mg (has no administration in time range)  ibuprofen (ADVIL) tablet 600 mg (has no administration in time range)    ED Course/ Medical Decision Making/ A&P                                 Medical Decision Making Risk OTC drugs. Prescription drug management.   Patient is a 38 year old female with no pertinent past medical history presented emergency room today with complaints of fatigue, generalized weakness frontal headache ear pressure and sinus congestion as well as dry cough.  She states that she is on day 3 or 4 symptoms currently.  No chest pain or significant dyspnea.  No vomiting but does feel nauseous at times.  She states her  primary concern is her headache.  She is worried that she may have influenza or COVID-19 she does have a sick contact at home which is her daughter.  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  No fevers or chills.  Physical exam overall unremarkable.  Patient has nontoxic-appearing, normal vital signs, speaking in complete sentences lungs are clear.  Patient has not had any medications today given Reglan and ibuprofen.  Hydration, rest, follow-up with primary care.  Tylenol and ibuprofen at home and return to the emergency room for any new or concerning symptoms, suspect viral illness.  COVID influenza RSV pending.  Final Clinical Impression(s) / ED Diagnoses Final diagnoses:  Viral illness    Rx / DC Orders ED Discharge Orders          Ordered    metoCLOPramide (REGLAN) 10 MG tablet  Every 6 hours        08/30/23 0949    ibuprofen (ADVIL) 600 MG tablet  Every 6 hours PRN        08/30/23 0949    promethazine-dextromethorphan (PROMETHAZINE-DM) 6.25-15 MG/5ML syrup  4 times daily PRN        08/30/23 0949              Gailen Shelter, PA 08/30/23 0953    Rexford Maus, DO 08/30/23 1236

## 2023-08-30 NOTE — Discharge Instructions (Addendum)
You will have result from your COVID/RSV/influenza PCR test in the next 1 to 2 hours.  Please use Tylenol or ibuprofen for pain.  You may use 600 mg ibuprofen every 6 hours or 1000 mg of Tylenol every 6 hours.  You may choose to alternate between the 2.  This would be most effective.  Not to exceed 4 g of Tylenol within 24 hours.  Not to exceed 3200 mg ibuprofen 24 hours.   Viral Illness TREATMENT  Treatment is directed at relieving symptoms. There is no cure. Antibiotics are not effective, because the infection is caused by a virus, not by bacteria. Treatment may include:  Increased fluid intake. Sports drinks offer valuable electrolytes, sugars, and fluids.  Breathing heated mist or steam (vaporizer or shower).  Eating chicken soup or other clear broths, and maintaining good nutrition.  Getting plenty of rest.  Using gargles or lozenges for comfort.  Increasing usage of your inhaler if you have asthma.  Return to work when your temperature has returned to normal.  Gargle warm salt water and spit it out for sore throat. Take benadryl to decrease sinus secretions. Continue to alternate between Tylenol and ibuprofen for pain and fever control.  Follow Up: Follow up with your primary care doctor in 5-7 days for recheck of ongoing symptoms.  Return to emergency department for emergent changing or worsening of symptoms.

## 2023-08-30 NOTE — ED Triage Notes (Signed)
Patient is here for evaluation of weakness, headache, ear pressure, congestion and non productive cough. Reports been going on for about 2 days now. Daughter currently sick with a 103.1 fever today. Concerned she may have flu or covid.

## 2023-09-24 ENCOUNTER — Emergency Department (HOSPITAL_COMMUNITY)

## 2023-09-24 ENCOUNTER — Emergency Department (HOSPITAL_COMMUNITY)
Admission: EM | Admit: 2023-09-24 | Discharge: 2023-09-24 | Disposition: A | Discharge status: Home / Self Care | Attending: Student | Admitting: Student

## 2023-09-24 DIAGNOSIS — W540XXA Bitten by dog, initial encounter: Secondary | ICD-10-CM | POA: Diagnosis not present

## 2023-09-24 DIAGNOSIS — J45909 Unspecified asthma, uncomplicated: Secondary | ICD-10-CM | POA: Insufficient documentation

## 2023-09-24 DIAGNOSIS — S61255A Open bite of left ring finger without damage to nail, initial encounter: Secondary | ICD-10-CM | POA: Diagnosis present

## 2023-09-24 MED ORDER — AMOXICILLIN-POT CLAVULANATE 875-125 MG PO TABS
1.0000 | ORAL_TABLET | Freq: Once | ORAL | Status: AC
  Administered 2023-09-24: 1 via ORAL
  Filled 2023-09-24: qty 1

## 2023-09-24 MED ORDER — AMOXICILLIN-POT CLAVULANATE 875-125 MG PO TABS: 1.0000 | ORAL_TABLET | Freq: Two times a day (BID) | ORAL | 0 refills | Status: AC

## 2023-09-24 NOTE — ED Notes (Signed)
 Finger pressure bandage with gauze and coband.

## 2023-09-24 NOTE — ED Triage Notes (Signed)
 Pt states that she was bitten by a dog at approximately 1500 yesterday to her L ring finger. Pt states she's had bleeding through several bandaids.

## 2023-09-24 NOTE — ED Provider Notes (Signed)
 [place] EMERGENCY DEPARTMENT AT [facility] Provider Note  CSN: [id number] Arrival date & time: 09/24/23 2725  Chief Complaint(s) Animal Bite  HPI Nicole Gallegos is a 38 y.o. female with PMH asthma, migraines, seizures, scoliosis who presents emergency room for evaluation of finger pain and bleeding from a dog bite.  Patient bitten by her friend's dog at 1500 on 09/23/2023 for the left ring finger.  States that she has bled through multiple Band-Aids and comes the emergency room for further evaluation.  Denies numbness, tingling, weakness of the finger.  Dog is vaccinated.  Denies any additional bites or complaints.   Past Medical History Past Medical History:  Diagnosis Date   Asthma    Exercise induced;inhaler prn   Constipation    Depression    No meds. currently   History of blood transfusion    Accident at 53 yoa and lost a lot of blood   History of blood transfusion 09/24/2012   Age 71 due to accident   Hx of pre-eclampsia in prior pregnancy, currently pregnant    had seizures   Low TSH level 03/04/2021   [ ]  rpt tsh 28wks [x ] f/u add ft4: neg    Migraine    Panic attacks    No meds currently;used to take meds in 2009   Preterm labor 07/11/2008   2nd child;had to stop labor 2 times;due to stress   Scoliosis    Seizures (HCC)    if gets overheated;no meds, last seizure 6 yrs ago   Patient Active Problem List   Diagnosis Date Noted   Syncope 05/31/2021   History of reversal of tubal ligation 03/02/2021   History of eclampsia 07/10/2016   Tension headache, chronic 04/21/2015   Migraine 04/21/2015   Anxiety and depression 09/24/2012   Asthma complicating pregnancy in third trimester 09/24/2012   History of seizure disorder 09/24/2012   Home Medication(s) Prior to Admission medications   Medication Sig Start Date End Date Taking? Authorizing Provider  amoxicillin-clavulanate (AUGMENTIN) 875-125 MG tablet Take 1 tablet by mouth every 12 (twelve) hours.  09/24/23  Yes Eather Chaires, MD  Blood Pressure Monitoring (BLOOD PRESSURE KIT) DEVI 1 Device by Does not apply route as needed. Patient not taking: Reported on 10/05/2021 02/26/21   Hershey Bing, MD  ibuprofen (ADVIL) 600 MG tablet Take 1 tablet (600 mg total) by mouth every 6 (six) hours as needed. 08/30/23   Gailen Shelter, PA  metoCLOPramide (REGLAN) 10 MG tablet Take 1 tablet (10 mg total) by mouth every 6 (six) hours. 08/30/23   Gailen Shelter, PA  promethazine-dextromethorphan (PROMETHAZINE-DM) 6.25-15 MG/5ML syrup Take 5 mLs by mouth 4 (four) times daily as needed for cough. 08/30/23   Gailen Shelter, PA  Past Surgical History Past Surgical History:  Procedure Laterality Date   DILATION AND CURETTAGE OF UTERUS     Suction D&C to remove 16 wk. fetal demise   FOOT SURGERY     R foot, repair torn tissue and skin graft   SKIN GRAFT Right    Upper Thigh   TUBAL LIGATION Bilateral 07/02/2016   Procedure: POST PARTUM TUBAL LIGATION with filshie clips;  Surgeon: Tereso Newcomer, MD;  Location: [facility];  Service: Gynecology;  Laterality: Bilateral;   TUBAL REVERSAL  09/2020   WISDOM TOOTH EXTRACTION  2008   Family History Family History  Problem Relation Age of Onset   Diabetes Mother    COPD Father    Seizures Father        if gets overheated   Migraines Father    Heart attack Maternal Grandmother        Deceased   Hypertension Maternal Grandmother    Diabetes Paternal Grandmother    Seizures Daughter        if gets overheated   Seizures Daughter        if gets overheated   Other Daughter        1st child had 2 vein umbilical cord; Pt did weekly NSTs.   Heart disease Maternal Aunt        Hole in the heart   Heart disease Maternal Uncle        Hole in the heart   Hypertension Maternal Uncle    Hypertension Paternal Uncle    Bipolar  disorder Cousin        maternal   Depression Cousin        maternal   Anesthesia problems Neg Hx     Social History Social History   Tobacco Use   Smoking status: Never   Smokeless tobacco: Never  Vaping Use   Vaping status: Never Used  Substance Use Topics   Alcohol use: Not Currently    Comment: occasionally   Drug use: No   Allergies Chocolate  Review of Systems Review of Systems  Skin:  Positive for wound.    Physical Exam Vital Signs  I have reviewed the triage vital signs BP 103/86 (BP Location: Left Arm)   Pulse 85   Temp 98.7 F (37.1 C) (Oral)   Resp 16   SpO2 100%   Physical Exam Vitals and nursing note reviewed.  Constitutional:      General: She is not in acute distress.    Appearance: She is well-developed.  HENT:     Head: Normocephalic and atraumatic.  Eyes:     Conjunctiva/sclera: Conjunctivae normal.  Cardiovascular:     Rate and Rhythm: Normal rate and regular rhythm.     Heart sounds: No murmur heard. Pulmonary:     Effort: Pulmonary effort is normal. No respiratory distress.     Breath sounds: Normal breath sounds.  Abdominal:     Palpations: Abdomen is soft.     Tenderness: There is no abdominal tenderness.  Musculoskeletal:        General: No swelling.     Cervical back: Neck supple.  Skin:    General: Skin is warm and dry.     Capillary Refill: Capillary refill takes less than 2 seconds.     Findings: Lesion present.  Neurological:     Mental Status: She is alert.  Psychiatric:        Mood and Affect: Mood normal.     ED Results  and Treatments Labs (all labs ordered are listed, but only abnormal results are displayed) Labs Reviewed - No data to display                                                                                                                        Radiology DG Hand Complete Left Result Date: 09/24/2023 CLINICAL DATA:  dog bite, r/o fx. EXAM: LEFT HAND - COMPLETE 3+ VIEW COMPARISON:  None  Available. FINDINGS: No acute fracture or dislocation. No aggressive osseous lesion. No significant arthritis of imaged joints. No radiopaque foreign bodies. Soft tissues are within normal limits.  No focal soft tissue defect. IMPRESSION: *No acute osseous abnormality of the left hand. No focal soft tissue defect or radiopaque foreign bodies. Electronically Signed   By: Jules Schick M.D.   On: 09/24/2023 10:51    Pertinent labs & imaging results that were available during my care of the patient were reviewed by me and considered in my medical decision making (see MDM for details).  Medications Ordered in ED Medications  amoxicillin-clavulanate (AUGMENTIN) 875-125 MG per tablet 1 tablet (1 tablet Oral Given 09/24/23 1058)                                                                                                                                     Procedures Procedures  (including critical care time)  Medical Decision Making / ED Course   This patient presents to the ED for concern of dog bite, this involves an extensive number of treatment options, and is a complaint that carries with it a high risk of complications and morbidity.  The differential diagnosis includes puncture wound, laceration, fracture, cellulitis  MDM: Patient seen emergency room for evaluation of dog bite.  Physical exam with a small 0.5 cm laceration to the distal left ring finger and bleeding is controlled.  Augmentin administered for dog bite.  X-ray reassuringly negative for fracture.  Compression dressing placed by nurse at bedside but at this time she does not meet inpatient criteria for admission and will be discharged with outpatient follow-up.  Augmentin prescription sent to pharmacy.   Additional history obtained:  -External records from outside source obtained and reviewed including: Chart review including previous notes, labs, imaging, consultation notes   Imaging Studies ordered: I ordered imaging  studies including x-ray hand I independently visualized and interpreted imaging. I agree with the radiologist interpretation  Medicines ordered and prescription drug management: Meds ordered this encounter  Medications   amoxicillin-clavulanate (AUGMENTIN) 875-125 MG per tablet 1 tablet   amoxicillin-clavulanate (AUGMENTIN) 875-125 MG tablet    Sig: Take 1 tablet by mouth every 12 (twelve) hours.    Dispense:  10 tablet    Refill:  0    -I have reviewed the patients home medicines and have made adjustments as needed  Critical interventions none    Social Determinants of Health:  Factors impacting patients care include: none   Reevaluation: After the interventions noted above, I reevaluated the patient and found that they have :improved  Co morbidities that complicate the patient evaluation  Past Medical History:  Diagnosis Date   Asthma    Exercise induced;inhaler prn   Constipation    Depression    No meds. currently   History of blood transfusion    Accident at 2 yoa and lost a lot of blood   History of blood transfusion 09/24/2012   Age 52 due to accident   Hx of pre-eclampsia in prior pregnancy, currently pregnant    had seizures   Low TSH level 03/04/2021   [ ]  rpt tsh 28wks [x ] f/u add ft4: neg    Migraine    Panic attacks    No meds currently;used to take meds in 2009   Preterm labor 07/11/2008   2nd child;had to stop labor 2 times;due to stress   Scoliosis    Seizures (HCC)    if gets overheated;no meds, last seizure 6 yrs ago      Dispostion: I considered admission for this patient, but at this time she does not meet inpatient criteria for admission and will be discharged with outpatient follow-up.     Final Clinical Impression(s) / ED Diagnoses Final diagnoses:  Dog bite, initial encounter     @PCDICTATION @    Glendora Score, MD 09/24/23 1940

## 2024-06-20 ENCOUNTER — Emergency Department (HOSPITAL_COMMUNITY)
Admission: EM | Admit: 2024-06-20 | Discharge: 2024-06-20 | Disposition: A | Discharge status: Home / Self Care | Attending: Emergency Medicine | Admitting: Emergency Medicine

## 2024-06-20 ENCOUNTER — Emergency Department (HOSPITAL_COMMUNITY)

## 2024-06-20 DIAGNOSIS — J45909 Unspecified asthma, uncomplicated: Secondary | ICD-10-CM | POA: Insufficient documentation

## 2024-06-20 DIAGNOSIS — R110 Nausea: Secondary | ICD-10-CM | POA: Insufficient documentation

## 2024-06-20 DIAGNOSIS — R0789 Other chest pain: Secondary | ICD-10-CM | POA: Insufficient documentation

## 2024-06-20 DIAGNOSIS — R55 Syncope and collapse: Secondary | ICD-10-CM | POA: Insufficient documentation

## 2024-06-20 DIAGNOSIS — R42 Dizziness and giddiness: Secondary | ICD-10-CM | POA: Insufficient documentation

## 2024-06-20 DIAGNOSIS — R519 Headache, unspecified: Secondary | ICD-10-CM | POA: Insufficient documentation

## 2024-06-20 LAB — CBC
HCT: 45.3 % (ref 36.0–46.0)
Hemoglobin: 15.8 g/dL — ABNORMAL HIGH (ref 12.0–15.0)
MCH: 31.6 pg (ref 26.0–34.0)
MCHC: 34.9 g/dL (ref 30.0–36.0)
MCV: 90.6 fL (ref 80.0–100.0)
Platelets: 243 K/uL (ref 150–400)
RBC: 5 MIL/uL (ref 3.87–5.11)
RDW: 12.4 % (ref 11.5–15.5)
WBC: 5.6 K/uL (ref 4.0–10.5)
nRBC: 0 % (ref 0.0–0.2)

## 2024-06-20 LAB — BASIC METABOLIC PANEL WITH GFR
Anion gap: 11 (ref 5–15)
BUN: 10 mg/dL (ref 6–20)
CO2: 24 mmol/L (ref 22–32)
Calcium: 10 mg/dL (ref 8.9–10.3)
Chloride: 103 mmol/L (ref 98–111)
Creatinine, Ser: 0.95 mg/dL (ref 0.44–1.00)
GFR, Estimated: >60 mL/min (ref >60)
Glucose, Bld: 83 mg/dL (ref 70–99)
Potassium: 3.3 mmol/L — ABNORMAL LOW (ref 3.5–5.1)
Sodium: 138 mmol/L (ref 135–145)

## 2024-06-20 LAB — TROPONIN T, HIGH SENSITIVITY
Troponin T High Sensitivity: <15 ng/L (ref 0–19)
Troponin T High Sensitivity: <15 ng/L (ref 0–19)

## 2024-06-20 LAB — HCG, SERUM, QUALITATIVE: Preg, Serum: NEGATIVE

## 2024-06-20 LAB — D-DIMER, QUANTITATIVE: D-Dimer, Quant: 0.28 ug{FEU}/mL (ref 0.00–0.50)

## 2024-06-20 MED ORDER — ACETAMINOPHEN 500 MG PO TABS
1000.0000 mg | ORAL_TABLET | Freq: Once | ORAL | Status: AC
  Administered 2024-06-20: 1000 mg via ORAL
  Filled 2024-06-20: qty 2

## 2024-06-20 MED ORDER — METOCLOPRAMIDE HCL 5 MG/ML IJ SOLN
10.0000 mg | Freq: Once | INTRAMUSCULAR | Status: AC
  Administered 2024-06-20: 10 mg via INTRAVENOUS
  Filled 2024-06-20: qty 2

## 2024-06-20 MED ORDER — DIPHENHYDRAMINE HCL 50 MG/ML IJ SOLN
12.5000 mg | Freq: Once | INTRAMUSCULAR | Status: AC
  Administered 2024-06-20: 12.5 mg via INTRAVENOUS
  Filled 2024-06-20: qty 1

## 2024-06-20 NOTE — Discharge Instructions (Addendum)
 Your lab work and imaging did not show any significant abnormality today.  If your symptoms recur please follow-up with your primary care doctor.  If you experience any new or worsening symptoms please return to the emergency room.

## 2024-06-20 NOTE — ED Triage Notes (Signed)
 Patient c/o sharp left sided chest pain x 15 mins ago. Patient report pain radiates to her left arm with numbness. Patient report nausea, denies vomiting. Patient denies SOB. Not taking blood thinners.

## 2024-06-20 NOTE — ED Provider Notes (Signed)
 [place] EMERGENCY DEPARTMENT AT [facility] Provider Note   CSN: [id number] Arrival date & time: 06/20/24  1541     Patient presents with: Chest Pain   Nicole Gallegos is a 38 y.o. female presents with complaints of chest pain that started this morning.  No shortness of breath.  This is associated with nausea and persistent dizziness described as room spinning and near syncope.  Patient states that she has been having intermittent headache as well.  Does not typically get headaches.  Does not associate with any vision changes, difficulty speaking or ambulating.  Her chest pain does radiate to her left shoulder and describes tingling down her left upper extremity.  It is described as sharp.  Has no cardiac history.  Does have a history of asthma.  No history of PE.  Is not pregnant, no OCP use, no recent surgeries, travel or hospitalizations.  Does have a history of TIA in addition to seizure, reportedly in the setting of eclampsia.     Chest Pain     Past Medical History:  Diagnosis Date   Asthma    Exercise induced;inhaler prn   Constipation    Depression    No meds. currently   History of blood transfusion    Accident at 27 yoa and lost a lot of blood   History of blood transfusion 09/24/2012   Age 40 due to accident   Hx of pre-eclampsia in prior pregnancy, currently pregnant    had seizures   Low TSH level 03/04/2021   [ ]  rpt tsh 28wks [x ] f/u add ft4: neg    Migraine    Panic attacks    No meds currently;used to take meds in 2009   Preterm labor 07/11/2008   2nd child;had to stop labor 2 times;due to stress   Scoliosis    Seizures (HCC)    if gets overheated;no meds, last seizure 6 yrs ago   Past Surgical History:  Procedure Laterality Date   DILATION AND CURETTAGE OF UTERUS     Suction D&C to remove 16 wk. fetal demise   FOOT SURGERY     R foot, repair torn tissue and skin graft   SKIN GRAFT Right    Upper Thigh   TUBAL LIGATION Bilateral  07/02/2016   Procedure: POST PARTUM TUBAL LIGATION with filshie clips;  Surgeon: Gloris DELENA Hugger, MD;  Location: [facility];  Service: Gynecology;  Laterality: Bilateral;   TUBAL REVERSAL  09/2020   WISDOM TOOTH EXTRACTION  2008    Prior to Admission medications  Medication Sig Start Date End Date Taking? Authorizing Provider  amoxicillin -clavulanate (AUGMENTIN ) 875-125 MG tablet Take 1 tablet by mouth every 12 (twelve) hours. 09/24/23   Kommor, Madison, MD  Blood Pressure Monitoring (BLOOD PRESSURE KIT) DEVI 1 Device by Does not apply route as needed. Patient not taking: Reported on 10/05/2021 02/26/21   Izell Harari, MD  ibuprofen  (ADVIL ) 600 MG tablet Take 1 tablet (600 mg total) by mouth every 6 (six) hours as needed. 08/30/23   Neldon Hamp RAMAN, PA  metoCLOPramide  (REGLAN ) 10 MG tablet Take 1 tablet (10 mg total) by mouth every 6 (six) hours. 08/30/23   Neldon Hamp RAMAN, PA  promethazine -dextromethorphan (PROMETHAZINE -DM) 6.25-15 MG/5ML syrup Take 5 mLs by mouth 4 (four) times daily as needed for cough. 08/30/23   Neldon Hamp RAMAN, PA    Allergies: Chocolate    Review of Systems  Cardiovascular:  Positive for chest pain.    Updated Vital  Signs BP 105/75   Pulse 62   Temp 98.1 F (36.7 C) (Oral)   Resp 18   SpO2 99%   Physical Exam Vitals and nursing note reviewed.  Constitutional:      General: She is not in acute distress.    Appearance: She is well-developed.  HENT:     Head: Normocephalic and atraumatic.  Eyes:     Conjunctiva/sclera: Conjunctivae normal.  Cardiovascular:     Rate and Rhythm: Normal rate and regular rhythm.     Heart sounds: No murmur heard. Pulmonary:     Effort: Pulmonary effort is normal. No respiratory distress.     Breath sounds: Normal breath sounds.  Abdominal:     Palpations: Abdomen is soft.     Tenderness: There is no abdominal tenderness.  Musculoskeletal:        General: No swelling.     Cervical back: Neck supple.  Skin:    General:  Skin is warm and dry.     Capillary Refill: Capillary refill takes less than 2 seconds.  Neurological:     Mental Status: She is alert.     Comments: Patient is alert and oriented. There is no abnormal phonation. Symmetric smile without facial droop. Moves all extremities spontaneously. 5/5 strength in upper and lower extremities. . No sensation deficit. There is no nystagmus. EOMI, PERRL. Coordination intact with finger to nose and normal ambulation.    Psychiatric:        Mood and Affect: Mood normal.     (all labs ordered are listed, but only abnormal results are displayed) Labs Reviewed  BASIC METABOLIC PANEL WITH GFR - Abnormal; Notable for the following components:      Result Value   Potassium 3.3 (*)    All other components within normal limits  CBC - Abnormal; Notable for the following components:   Hemoglobin 15.8 (*)    All other components within normal limits  HCG, SERUM, QUALITATIVE  D-DIMER, QUANTITATIVE  TROPONIN T, HIGH SENSITIVITY  TROPONIN T, HIGH SENSITIVITY    EKG: None  Radiology: CT Head Wo Contrast Result Date: 06/20/2024 EXAM: CT HEAD WITHOUT 06/20/2024 05:53:31 PM TECHNIQUE: CT of the head was performed without the administration of intravenous contrast. Automated exposure control, iterative reconstruction, and/or weight based adjustment of the mA/kV was utilized to reduce the radiation dose to as low as reasonably achievable. COMPARISON: 05/31/2021 CLINICAL HISTORY: Headache, neuro deficit FINDINGS: BRAIN AND VENTRICLES: No acute intracranial hemorrhage. No mass effect or midline shift. No extra-axial fluid collection. No evidence of acute infarct. No hydrocephalus. ORBITS: No acute abnormality. SINUSES AND MASTOIDS: No acute abnormality. SOFT TISSUES AND SKULL: No acute skull fracture. No acute soft tissue abnormality. IMPRESSION: 1. No acute intracranial abnormality. Electronically signed by: Pinkie Pebbles MD 06/20/2024 05:56 PM EST RP Workstation:  [id number]   DG Chest 2 View Result Date: 06/20/2024 EXAM: 2 VIEW(S) XRAY OF THE CHEST 06/20/2024 04:12:20 PM COMPARISON: 08/09/2016 CLINICAL HISTORY: Chest pain FINDINGS: LUNGS AND PLEURA: No focal pulmonary opacity. No pleural effusion. No pneumothorax. HEART AND MEDIASTINUM: No acute abnormality of the cardiac and mediastinal silhouettes. BONES AND SOFT TISSUES: No acute osseous abnormality. IMPRESSION: 1. No acute cardiopulmonary disease. Electronically signed by: Lynwood Seip MD 06/20/2024 04:29 PM EST RP Workstation: [id number]     Procedures   Medications Ordered in the ED  metoCLOPramide  (REGLAN ) injection 10 mg (10 mg Intravenous Given 06/20/24 1803)  diphenhydrAMINE  (BENADRYL ) injection 12.5 mg (12.5 mg Intravenous Given 06/20/24 1802)  acetaminophen  (TYLENOL )  tablet 1,000 mg (1,000 mg Oral Given 06/20/24 1801)    Clinical Course as of 06/20/24 1920  Thu Jun 20, 2024  1651 Patient evaluated for sharp chest pain that radiated to her left arm with associated numbness and tingling down her left upper extremity.  Additionally has been having a headache with persistent dizziness described as room spinning and near syncope.  Upon arrival patient is hypertensive, tachypneic and tachycardic.  Will obtain cardiac workup and CT head. [JT]  1653 CBC(!) No significant abnormality [JT]  1653 Basic metabolic panel(!) No acute findings [JT]  1653 hCG, serum, qualitative Negative [JT]  1653 Troponin T, High Sensitivity Within normal limits will trend [JT]  1725 D-dimer, quantitative Negative [JT]  1825 CT Head Wo Contrast No acute intracranial abnormality [JT]  1858 Workup is overall reassuring.  Patient reports marked improvement of her symptoms.  She is hemodynamically stable.  Patient is amenable to discharge.  Provided strict return precautions.  Encourage close PCP follow-up.  Patient is understanding and in agreement with plan. [JT]    Clinical Course User Index [JT] Donnajean Lynwood DEL, PA-C                                 Medical Decision Making Amount and/or Complexity of Data Reviewed Labs: ordered. Radiology: ordered.   This patient presents to the ED with chief complaint(s) of Chest pain.  The complaint involves an extensive differential diagnosis and also carries with it a high risk of complications and morbidity.   Pertinent past medical history as listed in HPI  The differential diagnosis includes  Based off exam and history do not suspect CVA, TIA, subarachnoid hemorrhage, ACS, PE, aortic dissection, pneumonia Additional history obtained: Records reviewed Care Everywhere/External Records  Disposition:   Patient will be discharged home. The patient has been appropriately medically screened and/or stabilized in the ED. I have low suspicion for any other emergent medical condition which would require further screening, evaluation or treatment in the ED or require inpatient management. At time of discharge the patient is hemodynamically stable and in no acute distress. I have discussed work-up results and diagnosis with patient and answered all questions. Patient is agreeable with discharge plan. We discussed strict return precautions for returning to the emergency department and they verbalized understanding.     Social Determinants of Health:   none  This note was dictated with voice recognition software.  Despite best efforts at proofreading, errors may have occurred which can change the documentation meaning.       Final diagnoses:  Atypical chest pain    ED Discharge Orders     None          Donnajean Lynwood DEL, PA-C 06/20/24 1920    Elnor Jayson LABOR, DO 06/21/24 1511
